# Patient Record
Sex: Female | Born: 1945
Health system: Southern US, Community
[De-identification: ages and names within clinical notes are randomized; demographics above are authoritative.]

## PROBLEM LIST (undated history)

## (undated) DIAGNOSIS — Z8719 Personal history of other diseases of the digestive system: Secondary | ICD-10-CM

## (undated) DIAGNOSIS — E785 Hyperlipidemia, unspecified: Secondary | ICD-10-CM

## (undated) DIAGNOSIS — D72819 Decreased white blood cell count, unspecified: Secondary | ICD-10-CM

## (undated) DIAGNOSIS — Z973 Presence of spectacles and contact lenses: Secondary | ICD-10-CM

## (undated) DIAGNOSIS — N2889 Other specified disorders of kidney and ureter: Secondary | ICD-10-CM

## (undated) DIAGNOSIS — K649 Unspecified hemorrhoids: Secondary | ICD-10-CM

## (undated) DIAGNOSIS — I10 Essential (primary) hypertension: Secondary | ICD-10-CM

## (undated) DIAGNOSIS — Z860100 Personal history of colon polyps, unspecified: Secondary | ICD-10-CM

## (undated) DIAGNOSIS — R319 Hematuria, unspecified: Secondary | ICD-10-CM

## (undated) HISTORY — DX: Essential (primary) hypertension: I10

## (undated) HISTORY — PX: BREAST BIOPSY: SHX20

---

## 1987-05-05 HISTORY — PX: APPENDECTOMY: SHX54

## 1998-02-22 ENCOUNTER — Ambulatory Visit (HOSPITAL_COMMUNITY): Admission: RE | Admit: 1998-02-22 | Discharge: 1998-02-22 | Payer: Self-pay | Admitting: *Deleted

## 1998-08-05 ENCOUNTER — Encounter: Admission: RE | Admit: 1998-08-05 | Discharge: 1998-08-27 | Payer: Self-pay | Admitting: Orthopedic Surgery

## 1998-08-14 ENCOUNTER — Ambulatory Visit (HOSPITAL_COMMUNITY): Admission: RE | Admit: 1998-08-14 | Discharge: 1998-08-14 | Payer: Self-pay | Admitting: Obstetrics and Gynecology

## 1998-10-17 ENCOUNTER — Encounter: Admission: RE | Admit: 1998-10-17 | Discharge: 1998-11-07 | Payer: Self-pay | Admitting: Orthopedic Surgery

## 1999-02-13 ENCOUNTER — Other Ambulatory Visit: Admission: RE | Admit: 1999-02-13 | Discharge: 1999-02-13 | Payer: Self-pay | Admitting: *Deleted

## 1999-05-05 HISTORY — PX: DILATION AND CURETTAGE OF UTERUS: SHX78

## 1999-07-10 ENCOUNTER — Encounter: Payer: Self-pay | Admitting: Obstetrics and Gynecology

## 1999-07-10 ENCOUNTER — Ambulatory Visit (HOSPITAL_COMMUNITY): Admission: RE | Admit: 1999-07-10 | Discharge: 1999-07-10 | Payer: Self-pay | Admitting: Obstetrics and Gynecology

## 1999-09-18 ENCOUNTER — Encounter: Payer: Self-pay | Admitting: *Deleted

## 1999-09-18 ENCOUNTER — Ambulatory Visit (HOSPITAL_COMMUNITY): Admission: RE | Admit: 1999-09-18 | Discharge: 1999-09-18 | Payer: Self-pay | Admitting: *Deleted

## 2000-02-19 ENCOUNTER — Other Ambulatory Visit: Admission: RE | Admit: 2000-02-19 | Discharge: 2000-02-19 | Payer: Self-pay | Admitting: *Deleted

## 2000-02-20 ENCOUNTER — Encounter: Payer: Self-pay | Admitting: *Deleted

## 2000-02-25 ENCOUNTER — Observation Stay (HOSPITAL_COMMUNITY): Admission: RE | Admit: 2000-02-25 | Discharge: 2000-02-26 | Payer: Self-pay | Admitting: *Deleted

## 2000-02-25 ENCOUNTER — Encounter (INDEPENDENT_AMBULATORY_CARE_PROVIDER_SITE_OTHER): Payer: Self-pay | Admitting: Specialist

## 2000-02-25 HISTORY — PX: SUPRACERVICAL ABDOMINAL HYSTERECTOMY: SHX5393

## 2000-07-22 ENCOUNTER — Encounter: Payer: Self-pay | Admitting: Obstetrics and Gynecology

## 2000-07-22 ENCOUNTER — Ambulatory Visit (HOSPITAL_COMMUNITY): Admission: RE | Admit: 2000-07-22 | Discharge: 2000-07-22 | Payer: Self-pay | Admitting: Obstetrics and Gynecology

## 2001-04-11 ENCOUNTER — Other Ambulatory Visit: Admission: RE | Admit: 2001-04-11 | Discharge: 2001-04-11 | Payer: Self-pay | Admitting: *Deleted

## 2001-07-26 ENCOUNTER — Ambulatory Visit (HOSPITAL_COMMUNITY): Admission: RE | Admit: 2001-07-26 | Discharge: 2001-07-26 | Payer: Self-pay | Admitting: Family Medicine

## 2001-07-26 ENCOUNTER — Encounter: Payer: Self-pay | Admitting: Family Medicine

## 2001-08-03 ENCOUNTER — Encounter: Admission: RE | Admit: 2001-08-03 | Discharge: 2001-08-03 | Payer: Self-pay | Admitting: Family Medicine

## 2001-08-03 ENCOUNTER — Encounter: Payer: Self-pay | Admitting: Family Medicine

## 2002-04-12 ENCOUNTER — Other Ambulatory Visit: Admission: RE | Admit: 2002-04-12 | Discharge: 2002-04-12 | Payer: Self-pay | Admitting: *Deleted

## 2002-05-31 ENCOUNTER — Other Ambulatory Visit: Admission: RE | Admit: 2002-05-31 | Discharge: 2002-05-31 | Payer: Self-pay | Admitting: *Deleted

## 2002-07-12 ENCOUNTER — Encounter: Payer: Self-pay | Admitting: Emergency Medicine

## 2002-07-12 ENCOUNTER — Emergency Department (HOSPITAL_COMMUNITY): Admission: EM | Admit: 2002-07-12 | Discharge: 2002-07-12 | Payer: Self-pay | Admitting: Emergency Medicine

## 2002-07-31 ENCOUNTER — Encounter: Admission: RE | Admit: 2002-07-31 | Discharge: 2002-07-31 | Payer: Self-pay | Admitting: Family Medicine

## 2002-07-31 ENCOUNTER — Encounter: Payer: Self-pay | Admitting: Family Medicine

## 2003-01-02 ENCOUNTER — Ambulatory Visit (HOSPITAL_COMMUNITY): Admission: RE | Admit: 2003-01-02 | Discharge: 2003-01-02 | Payer: Self-pay | Admitting: Gastroenterology

## 2003-03-14 ENCOUNTER — Encounter: Admission: RE | Admit: 2003-03-14 | Discharge: 2003-03-14 | Payer: Self-pay | Admitting: Family Medicine

## 2003-04-03 ENCOUNTER — Other Ambulatory Visit: Admission: RE | Admit: 2003-04-03 | Discharge: 2003-04-03 | Payer: Self-pay | Admitting: *Deleted

## 2003-08-01 ENCOUNTER — Encounter: Admission: RE | Admit: 2003-08-01 | Discharge: 2003-08-01 | Payer: Self-pay | Admitting: Family Medicine

## 2004-02-29 ENCOUNTER — Emergency Department (HOSPITAL_COMMUNITY): Admission: EM | Admit: 2004-02-29 | Discharge: 2004-02-29 | Payer: Self-pay | Admitting: Emergency Medicine

## 2004-04-08 ENCOUNTER — Other Ambulatory Visit: Admission: RE | Admit: 2004-04-08 | Discharge: 2004-04-08 | Payer: Self-pay | Admitting: *Deleted

## 2004-04-10 ENCOUNTER — Ambulatory Visit (HOSPITAL_COMMUNITY): Admission: RE | Admit: 2004-04-10 | Discharge: 2004-04-10 | Payer: Self-pay | Admitting: General Surgery

## 2004-08-13 ENCOUNTER — Encounter: Admission: RE | Admit: 2004-08-13 | Discharge: 2004-08-13 | Payer: Self-pay | Admitting: Family Medicine

## 2005-04-15 ENCOUNTER — Other Ambulatory Visit: Admission: RE | Admit: 2005-04-15 | Discharge: 2005-04-15 | Payer: Self-pay | Admitting: *Deleted

## 2005-06-09 ENCOUNTER — Encounter: Admission: RE | Admit: 2005-06-09 | Discharge: 2005-06-09 | Payer: Self-pay | Admitting: Occupational Medicine

## 2005-06-11 ENCOUNTER — Encounter: Admission: RE | Admit: 2005-06-11 | Discharge: 2005-06-11 | Payer: Self-pay | Admitting: *Deleted

## 2005-08-27 ENCOUNTER — Encounter: Admission: RE | Admit: 2005-08-27 | Discharge: 2005-08-27 | Payer: Self-pay | Admitting: Family Medicine

## 2005-08-28 ENCOUNTER — Inpatient Hospital Stay (HOSPITAL_COMMUNITY): Admission: EM | Admit: 2005-08-28 | Discharge: 2005-08-31 | Payer: Self-pay | Admitting: Emergency Medicine

## 2005-08-28 ENCOUNTER — Ambulatory Visit: Payer: Self-pay | Admitting: Cardiology

## 2005-08-31 ENCOUNTER — Encounter: Payer: Self-pay | Admitting: Cardiology

## 2005-09-11 ENCOUNTER — Ambulatory Visit (HOSPITAL_COMMUNITY): Admission: RE | Admit: 2005-09-11 | Discharge: 2005-09-11 | Payer: Self-pay | Admitting: Family Medicine

## 2005-11-05 ENCOUNTER — Ambulatory Visit (HOSPITAL_COMMUNITY): Admission: RE | Admit: 2005-11-05 | Discharge: 2005-11-05 | Payer: Self-pay | Admitting: Gastroenterology

## 2005-11-09 ENCOUNTER — Ambulatory Visit (HOSPITAL_COMMUNITY): Admission: RE | Admit: 2005-11-09 | Discharge: 2005-11-09 | Payer: Self-pay | Admitting: Gastroenterology

## 2006-01-21 ENCOUNTER — Ambulatory Visit (HOSPITAL_COMMUNITY): Admission: RE | Admit: 2006-01-21 | Discharge: 2006-01-21 | Payer: Self-pay | Admitting: Orthopedic Surgery

## 2006-04-28 ENCOUNTER — Other Ambulatory Visit: Admission: RE | Admit: 2006-04-28 | Discharge: 2006-04-28 | Payer: Self-pay | Admitting: *Deleted

## 2006-09-01 ENCOUNTER — Encounter: Admission: RE | Admit: 2006-09-01 | Discharge: 2006-09-01 | Payer: Self-pay | Admitting: Family Medicine

## 2007-05-09 ENCOUNTER — Other Ambulatory Visit: Admission: RE | Admit: 2007-05-09 | Discharge: 2007-05-09 | Payer: Self-pay | Admitting: *Deleted

## 2007-07-12 ENCOUNTER — Encounter (INDEPENDENT_AMBULATORY_CARE_PROVIDER_SITE_OTHER): Payer: Self-pay | Admitting: Gastroenterology

## 2007-07-12 ENCOUNTER — Ambulatory Visit (HOSPITAL_COMMUNITY): Admission: RE | Admit: 2007-07-12 | Discharge: 2007-07-12 | Payer: Self-pay | Admitting: Gastroenterology

## 2007-09-02 ENCOUNTER — Encounter: Admission: RE | Admit: 2007-09-02 | Discharge: 2007-09-02 | Payer: Self-pay | Admitting: Family Medicine

## 2007-10-22 ENCOUNTER — Emergency Department (HOSPITAL_COMMUNITY): Admission: EM | Admit: 2007-10-22 | Discharge: 2007-10-22 | Payer: Self-pay | Admitting: Emergency Medicine

## 2008-06-18 ENCOUNTER — Ambulatory Visit (HOSPITAL_BASED_OUTPATIENT_CLINIC_OR_DEPARTMENT_OTHER): Admission: RE | Admit: 2008-06-18 | Discharge: 2008-06-18 | Payer: Self-pay | Admitting: Urology

## 2008-06-18 HISTORY — PX: OTHER SURGICAL HISTORY: SHX169

## 2008-07-23 ENCOUNTER — Encounter (INDEPENDENT_AMBULATORY_CARE_PROVIDER_SITE_OTHER): Payer: Self-pay | Admitting: Gastroenterology

## 2008-07-23 ENCOUNTER — Ambulatory Visit (HOSPITAL_COMMUNITY): Admission: RE | Admit: 2008-07-23 | Discharge: 2008-07-23 | Payer: Self-pay | Admitting: Gastroenterology

## 2008-09-03 ENCOUNTER — Encounter: Admission: RE | Admit: 2008-09-03 | Discharge: 2008-09-03 | Payer: Self-pay | Admitting: Family Medicine

## 2009-08-19 ENCOUNTER — Ambulatory Visit (HOSPITAL_COMMUNITY): Admission: RE | Admit: 2009-08-19 | Discharge: 2009-08-19 | Payer: Self-pay | Admitting: Gastroenterology

## 2009-09-05 ENCOUNTER — Encounter: Admission: RE | Admit: 2009-09-05 | Discharge: 2009-09-05 | Payer: Self-pay | Admitting: Family Medicine

## 2010-04-03 ENCOUNTER — Ambulatory Visit (HOSPITAL_COMMUNITY)
Admission: RE | Admit: 2010-04-03 | Discharge: 2010-04-03 | Payer: Self-pay | Source: Home / Self Care | Admitting: Gastroenterology

## 2010-05-25 ENCOUNTER — Encounter: Payer: Self-pay | Admitting: Gastroenterology

## 2010-07-28 ENCOUNTER — Encounter: Payer: Self-pay | Admitting: Obstetrics and Gynecology

## 2010-08-14 ENCOUNTER — Ambulatory Visit (INDEPENDENT_AMBULATORY_CARE_PROVIDER_SITE_OTHER): Payer: Commercial Managed Care - PPO | Admitting: Cardiovascular Disease

## 2010-08-14 ENCOUNTER — Encounter: Payer: Self-pay | Admitting: Cardiovascular Disease

## 2010-08-14 VITALS — BP 138/84 | HR 60 | Ht 64.0 in | Wt 155.0 lb

## 2010-08-14 DIAGNOSIS — I10 Essential (primary) hypertension: Secondary | ICD-10-CM | POA: Insufficient documentation

## 2010-08-14 MED ORDER — LOSARTAN POTASSIUM 50 MG PO TABS: 50.0000 mg | ORAL_TABLET | Freq: Every day | ORAL | Status: DC

## 2010-08-14 NOTE — Progress Notes (Signed)
Caitlin Walker Date of Birth  October 28, 1945 [facility] / [facility] [address]     [address] [place], [place]  [address] [phone]  Fax  [phone]   History of Present Illness: Caitlin Walker is a middle-age female with a history of hypertension. We are asked to see her today for further management of her high blood pressure.  Her blood pressure has been gradually increasing since December. She is very careful about eating any extra salt. She exercises 3 times a week. She recently was tried on Bystolic but this caused her to have some atypical episodes of chest pain. These episodes of chest pain occurred in the morning and never occurred with exertion. They resolved after she discontinued the Bystolic. She denies any PND or orthopnea. She denies any syncope or presyncope.  The patient denies any heat or cold intolerance.  No weight gain or weight loss.  The patient denies headaches or blurry vision.  There is no cough or sputum production.  The patient denies dizziness.  There is no hematuria or hematochezia.  The patient denies any muscle aches or arthritis.  The patient denies any rash.  The patient denies frequent falling or instability.  There is no history of depression or anxiety.  All other systems were reviewed and are negative.  Current Outpatient Prescriptions  Medication Sig Dispense Refill  . amLODipine (NORVASC) 5 MG tablet Take 5 mg by mouth daily.        . calcium citrate-vitamin D 200-200 MG-UNIT TABS Take 1 tablet by mouth daily.        . hydrochlorothiazide 25 MG tablet Take 25 mg by mouth daily.        Marland Kitchen zolpidem (AMBIEN CR) 12.5 MG CR tablet Take 12.5 mg by mouth at bedtime as needed.           Allergies  Allergen Reactions  . Bactrim Rash  . Flomax (Tamsulosin Hcl) Rash    Past Medical History  Diagnosis Date  . Hypertension   . Personal history of kidney stones 2010    cystoscopy done right ureter unable to pass scope, passed  stone on own  . Chest pain at rest     Past Surgical History  Procedure Date  . Appendectomy     History  Smoking status  . Never Smoker   Smokeless tobacco  . Not on file    History  Alcohol Use No    Family History  Problem Relation Age of Onset  . Heart disease Father   . Hypertension Father   . Hypertension Sister   . Hyperlipidemia Sister   . Hypertension Brother   . Hypertension Sister   . Colon cancer Sister   . Hypertension Sister   . Hypertension Sister   . Hypertension Sister   . Hyperlipidemia Sister   . Hypertension Brother   . Hypertension Brother   . Hyperlipidemia Brother     Reviw of Systems:  Reviewed in the history of present illness. All other systems are negative. Physical Exam: BP 138/84  Pulse 60  Ht 5\' 4"  (1.626 m)  Wt 155 lb (70.308 kg)  BMI 26.61 kg/m2 The patient is alert and oriented x 3.  The mood and affect are normal.  The skin is warm and dry.  Color is normal.  The HEENT exam reveals that the sclera are nonicteric.  The mucous membranes are moist.  The carotids are 2+ without bruits.  There is no thyromegaly.  There  is no JVD.  The lungs are clear.  The chest wall is non tender.  The heart exam reveals a regular rate with a normal S1 and S2.  There are no murmurs, gallops, or rubs.  The PMI is not displaced.   Abdominal exam reveals good bowel sounds.  There is no guarding or rebound.  There is no hepatosplenomegaly or tenderness.  There are no masses.  Exam of the legs reveal no clubbing, cyanosis, or edema.  The legs are without rashes.  The distal pulses are intact.  Cranial nerves II - XII are intact.  Motor and sensory functions are intact.  The gait is normal.  ECG: From Dr. Tenny Craw office reveals sinus bradycardia. She has no ST or T wave changes. Assessment / Plan:

## 2010-08-14 NOTE — Assessment & Plan Note (Signed)
Caitlin Walker is doing quite a bit better from a cardiac standpoint. Her blood pressure is only mildly elevated today. I like to add losartan 50 mg a day to her current medical regimen. He should help bring down her diastolic blood pressure. I would rather do this than to increase her Norvasc to 10 mg a day since I think that would be associated with worsening side effects.  I've asked her to exercise on a regular basis. I'll see her back in 3 months for office visit and basic metabolic profile.

## 2010-08-18 ENCOUNTER — Telehealth: Payer: Self-pay | Admitting: Cardiovascular Disease

## 2010-08-18 ENCOUNTER — Telehealth: Payer: Self-pay | Admitting: *Deleted

## 2010-08-18 NOTE — Telephone Encounter (Signed)
Pt was seen last week. Losartan was started to lower diastoloc pressure, pt has continued with hctz and norvasc. Pt noted increased  Bilateral leg edema on 08/16/10 and elevated legs for two days with good results. Pt has not taken Norvasc today, she feels this med is causing swelling. Are you ok with DC ing Norvasc? Please advise. Alfonso Ramus RN

## 2010-08-18 NOTE — Telephone Encounter (Signed)
CALL PATIENT REGARDING SOME SWELLING IN LEGS, THINKS ITS ONE OF HER MEDS.

## 2010-08-18 NOTE — Telephone Encounter (Signed)
msg left to dc norvasc and to follow up for bp check app.Alfonso Ramus RN

## 2010-08-18 NOTE — Telephone Encounter (Signed)
OK to stop Norvasc  

## 2010-08-19 LAB — POCT I-STAT 4, (NA,K, GLUC, HGB,HCT)
Glucose, Bld: 88 mg/dL (ref 70–99)
HCT: 39 % (ref 36.0–46.0)
Hemoglobin: 13.3 g/dL (ref 12.0–15.0)
Potassium: 4.3 meq/L (ref 3.5–5.1)
Sodium: 140 meq/L (ref 135–145)

## 2010-09-02 ENCOUNTER — Other Ambulatory Visit: Payer: Self-pay | Admitting: Family Medicine

## 2010-09-02 DIAGNOSIS — Z780 Asymptomatic menopausal state: Secondary | ICD-10-CM

## 2010-09-02 DIAGNOSIS — Z1231 Encounter for screening mammogram for malignant neoplasm of breast: Secondary | ICD-10-CM

## 2010-09-16 NOTE — Op Note (Signed)
NAME:  MAHI, ZABRISKIE NO.:  [id number]   MEDICAL RECORD NO.:  [id number]          PATIENT TYPE:  AMB   LOCATION:  ENDO                         FACILITY:  [facility]   PHYSICIAN:  Petra Kuba, M.D.    DATE OF BIRTH:  [birth year]   DATE OF PROCEDURE:  07/12/2007  DATE OF DISCHARGE:                               OPERATIVE REPORT   PROCEDURE:  Colonoscopy.   INDICATIONS:  Patient with a family history of colon cancer, family  history of colon polyps, due for colonic screening.  Consent was signed  after risks, benefits, methods, options thoroughly discussed multiple  times in the past.   MEDICINES USED:  Fentanyl 75 mcg, Versed 7 mg.   PROCEDURE:  Rectal inspection is pertinent for external hemorrhoids,  small.  Digital exam was negative.  Video pediatric colonoscope was  inserted with abdominal pressure fairly easily able to be advanced  around the colon to the cecum.  No abnormalities were seen on insertion.  The cecum was identified by the appendiceal orifice and the ileocecal  valve.  In fact, the scope was inserted short way into the terminal  ileum, which was normal.  Photo documentation was obtained and the scope  was slowly withdrawn.  Prep was adequate.  There was some liquid stool  that required washing and suctioning.  On slow withdrawal through the  colon there was a semisessile polyp, small, right on the ileocecal  valve.  It was snared x1.  A piece was suctioned through the scope and  collected in the trap.  We did try to re-snare some possible polypoid  residual tissue but were unsuccessful, so we went ahead and took two hot  biopsies of the area.  The scope was then slowly withdrawn.  Two other  additional tiny polyps were seen, one in the mid ascending, one in the  proximal sigmoid.  Both were hot biopsied, put in separate containers.  No other abnormalities were seen as we slowly withdrew back to the  rectum.  Once back in the rectum anorectal  pull-through and retroflexion  confirmed some small hemorrhoids.  Scope was straightened and readvanced  short way up the left side of the colon.  Air was suctioned, scope  removed.  The patient tolerated the procedure well.  There was no  obvious immediate complication.   ENDOSCOPIC DIAGNOSES:  1. Internal-external small hemorrhoids.  2. Ileocecal valve small polyp, status post snare and hot biopsy x2.  3. Two tiny ascending and sigmoid polyps, hot biopsied.  4. Otherwise within normal limits to the terminal ileum.   PLAN:  Await pathology to determine future colonic screening.  If this  is an adenomatous polyp in the ileocecal valve, will need to be screened  sooner than usual to make sure all is completely removed.  Hold aspirin  and nonsteroidals for 10 days.  Happy to see back p.r.n.           ______________________________  Petra Kuba, M.D.     MEM/MEDQ  D:  07/12/2007  T:  07/13/2007  Job:  [id number]   cc:   C.  Duane Lope, M.D.

## 2010-09-16 NOTE — Op Note (Signed)
NAME:  Caitlin Walker, Caitlin Walker NO.:  [id number]   MEDICAL RECORD NO.:  [id number]          PATIENT TYPE:  AMB   LOCATION:  ENDO                         FACILITY:  [facility]   PHYSICIAN:  Petra Kuba, M.D.    DATE OF BIRTH:  02/04/1946   DATE OF PROCEDURE:  07/23/2008  DATE OF DISCHARGE:                               OPERATIVE REPORT   PROCEDURE:  Colonoscopy.   INDICATIONS:  Patient with difficult to remove IC valve polyp, family  history of colon cancer and colon polyps, want to repeat colonoscopy.  Consent was signed after risks, benefits, methods, options thoroughly  discussed multiple times in the past.   MEDICINES USED:  Fentanyl 125 mcg, Versed 12 mg, Benadryl 25 mg.   PROCEDURE:  Rectal inspection is pertinent for external hemorrhoids  small.  Digital exam was negative.  The video pediatric colonoscope was  inserted and with abdominal pressure, fairly easily able to be advanced  around the colon to the cecum.  Other than the ileocecal valve recurrent  polyp versus residual polyp, no other abnormalities were seen.  The  cecum was identified by the appendiceal orifice and ileocecal valve.  In  fact, the scope was inserted a short ways in the terminal ileum.  The  polyp did not seem to enter the terminal ileum.  We went ahead and  snared the polyp three different times, even though it was small to  medium but it was difficult position, electrocautery was applied and all  pieces were snared and collected in the trap.  We then hot biopsied the  edge of the polyp and just to make sure there was no residual tissue  left, went ahead and ERBE'd with the argon tissue plasma coagulator more  less the entire area.  There was a nice white coagulum throughout  without obvious residual polyp.  We elected to slowly withdraw.  On slow  withdrawal through the colon, no other polypoid lesions diverticula or  other abnormalities were seen as we slowly withdrew back to the rectum.  Once back in the rectum, anorectal pull-through and retroflexion  confirmed some small hemorrhoids.  Scope was straightened and readvanced  a short ways up the left side of the colon.  Air was suctioned, scope  removed.  The patient tolerated the procedure well.  There was no  obvious immediate complication.   ENDOSCOPIC DIAGNOSES:  1. Internal-external hemorrhoids.  2. Residual versus recurrent ileocecal valve polyp status post snared,      hot biopsied and ERBE argon tissue plasma coagulator.   PLAN:  Await pathology to determine future screening.  Relook at the  polyp anywhere from 1-3 years depending on pathology.  Happy to see back  p.r.n.  Return care to Dr. Tenny Craw for the customary healthcare screening  and maintenance.           ______________________________  Petra Kuba, M.D.     MEM/MEDQ  D:  07/23/2008  T:  07/23/2008  Job:  [id number]   cc:   Miguel Aschoff, M.D.  Fax: [phone]

## 2010-09-16 NOTE — Op Note (Signed)
NAME:  Caitlin Walker, Caitlin Walker NO.:  [id number]   MEDICAL RECORD NO.:  [id number]          PATIENT TYPE:  AMB   LOCATION:  [facility]                         FACILITY:  [facility]   PHYSICIAN:  Bertram Millard. Dahlstedt, M.D.DATE OF BIRTH:  [birth year]   DATE OF PROCEDURE:  06/18/2008  DATE OF DISCHARGE:                               OPERATIVE REPORT   PREOPERATIVE DIAGNOSIS:  Right pelvic pain/bladder pain.   POSTOPERATIVE DIAGNOSIS:  Possible mild interstitial cystitis.   SURGICAL PROCEDURES:  Cystoscopy, right retrograde ureteral pyelogram,  hydrodistention of bladder, right ureteroscopy, urethral dilatation,  placement of Pyridium and Marcaine.   SURGEON:  Bertram Millard. Dahlstedt, M.D.   ANESTHESIA:  General with LMA.   COMPLICATIONS:  None.   BRIEF HISTORY:  Caitlin Walker is a 65 year old female with persistent right  pelvic and lower abdominal pain.  This occurs mainly with a full  bladder.  She had been evaluated for microscopic hematuria.  For a  while, in 2009, it was felt that she may have a right ureteral stone.  Further evaluation revealed that she probably did not have a stone.  However, she has had persistent right pelvic pain and difficulty  emptying her bladder.  Recent urodynamics showed poor contractility of  the bladder.  Due to her persistent symptoms, and the possibility that  she may have an odd type of interstitial cystitis, she is brought to the  operating room now for definitive HOD and retrograde pyelograms as well  as urethral dilatation.  Risks and complications have been discussed  with the patient.  She understands these and desires to proceed.   DESCRIPTION OF PROCEDURE:  The patient was identified in the holding  area.  She received preoperative IV antibiotics.  She was taken to the  operating room where general anesthetic was administered using LMA.  She  was placed in the dorsal lithotomy position.  Genitalia and perineum  were prepped and draped after a B  and O suppository was placed.  The  bladder was inspected circumferentially with the 12 degrees lens.  There  were very mild trabeculations.  There were some mildly prominent vessels  throughout.  No bladder lesions were seen, however.  A retrograde  pyelogram was performed on the right.  Catheterization of the ureteral  orifice was quite difficult as the ureteral orifice on this side was  quite tiny.  Eventually, a guidewire was advanced through the orifice  and a 6-French open-ended catheter advanced over top of that.  A  retrograde was performed.  There are no filling defects along the course  of the right ureter.  Following adequate visualization of the right-  sided system, the open-end catheter was removed.  At this point, using  fluoroscopy, there seemed to be a filling defect distally in the ureter.  I then dilated the distal ureter over a guidewire with the inner sheath  of the ureteral access catheter.  Ureteroscopy was performed all the way  up to the pelvis.  I did not see any sign of the stone.  There was some  mild narrowing of the right distal ureter, right at the  UVJ, however.  I  attempted to perform a left retrograde.  The ureteral orifice was nipple  shaped, and I could not navigate a guidewire through this area.  As the  patient had his right sided symptoms, I was not aggressive in performing  a left-sided retrograde.  The bladder was then filled to capacity with  water at a height of 700 mm.  The water was left indwelling for 10  minutes.  The bladder was evacuated.  Very, very mild glomerulations  were seen.  Actually, these were not that much different from before the  hydrodistention.  At this  point the urethra was dilated to 30-French with female sounds and a  Pyridium Marcaine mixture placed.  The procedure was then terminated.  She was awakened and taken to PACU in stable condition.  She was sent  home with 3 days of cephalexin 500 mg b.i.d. as well as  hydrocodone.  She will follow-up in approximately 2 weeks.      Bertram Millard. Dahlstedt, M.D.  Electronically Signed     SMD/MEDQ  D:  06/18/2008  T:  06/18/2008  Job:  [id number]   cc:   C. Duane Lope, M.D.  Fax: [phone]

## 2010-09-18 ENCOUNTER — Ambulatory Visit
Admission: RE | Admit: 2010-09-18 | Discharge: 2010-09-18 | Disposition: A | Discharge status: Home / Self Care | Payer: Commercial Managed Care - PPO | Source: Ambulatory Visit | Attending: Family Medicine | Admitting: Family Medicine

## 2010-09-18 DIAGNOSIS — Z1231 Encounter for screening mammogram for malignant neoplasm of breast: Secondary | ICD-10-CM

## 2010-09-18 DIAGNOSIS — Z780 Asymptomatic menopausal state: Secondary | ICD-10-CM

## 2010-09-19 NOTE — Discharge Summary (Signed)
[facility]  Patient:    Caitlin Walker, Caitlin Walker                     MRN: [id number] Adm. Date:  02/25/00 Disc. Date: 02/26/00 Attending:  Almedia Balls. Randell Patient, M.D.                           Discharge Summary  ADMISSION HISTORY:  The patient is a 65 year old with enlarging leiomyomata uteri, pelvic pain, bladder symptoms secondary to impingement by the fibroids on her bladder.  She was admitted on October 24 for abdominal hysterectomy and bilateral salpingo-oophorectomy and possible anterior and posterior colporrhaphy because of cystocele and rectocele.  Remainder of her History and Physical are as previously dictated.  LABORATORY AND X-RAY DATA:  Laboratory data includes preoperative hemoglobin 12.1, immediate postoperative hemoglobin 7.9, hemoglobin on February 26, 2000, 7.7.  Basic metabolic panel was within normal limits.  Chest x-ray and electrocardiogram were normal.  HOSPITAL COURSE:  The patient was taken to the operating room on October 24 at which time abdominal supracervical hysterectomy, bilateral salpingo-oophorectomy, and posterior colporrhaphy were performed.  The patient did well postoperatively except for some pain and nausea thought to be secondary to morphine PCA.  After stopping the morphine, the nausea resolved. Diet and ambulation were progressed over the evening of October 24 and early morning of October 25.  On the morning of October 25, she was afebrile and experiencing no problems except for the nausea which did improve, and it was felt that she could be discharged at this time.  FINAL DIAGNOSES: 1. Leiomyomata uteri. 2. Pelvic pain. 3. Rectocele.  OPERATIONS/PROCEDURES PERFORMED: 1. Abdominal supracervical hysterectomy. 2. Bilateral salpingo-oophorectomy. 3. Posterior colporrhaphy.  PATHOLOGY:  Pathology report unavailable at the time of dictation.  DISPOSITION/FOLLOWUP:  Discharged home to return to the office  in approximately two weeks for follow-up.  She is to call for any problems.  DISCHARGE INSTRUCTIONS:  She was instructed to gradually progress her activities over several weeks at home and to limit lifting and driving for two weeks.  CONDITION ON DISCHARGE:  She is fully ambulatory, on a regular diet, and in good condition at the time of discharge.  DISCHARGE MEDICATIONS:  She is given a prescription for Tylox or generic #30 to be taken one or two q.4h. p.r.n. pain.  DD:  02/26/00 TD:  02/27/00 Job: [id number]

## 2010-09-19 NOTE — Op Note (Signed)
   NAME:  Caitlin Walker, Caitlin Walker                      ACCOUNT NO.:  [id number]   MEDICAL RECORD NO.:  [id number]                   PATIENT TYPE:  AMB   LOCATION:  ENDO                                 [facility]AC[facility]   PHYSICIAN:  Petra Kuba, M.D.                 DATE OF BIRTH:  [birth year]   DATE OF PROCEDURE:  01/02/2003  DATE OF DISCHARGE:                                 OPERATIVE REPORT   PROCEDURE:  Colonoscopy.   INDICATIONS FOR PROCEDURE:  Some mild change in bowel habits, due for  colonic screening.   Consent was signed after risks, benefits, methods, and options were  thoroughly discussed in the past.   MEDICINES USED:  Demerol 50, Versed 5.   DESCRIPTION OF PROCEDURE:  Rectal inspection was pertinent for external  hemorrhoids. Digital exam was negative. The pediatric video adjustable  colonoscope was inserted, fairly easily despite a tortuous sigmoid advanced  to the cecum. This did require some abdominal pressure but no position  changes. No obvious abnormality was seen on insertion. The cecum was  identified by the appendiceal orifice and the ileocecal valve. In fact, the  scope was inserted a short ways into the terminal ileum which was normal.  Photo documentation was obtained. The scope was slowly withdrawn. The prep  was adequate. There was minimal liquid stool that required washing and  suctioning. On slow withdrawal through the colon, other than the tortuous  sigmoid no abnormalities were seen. Anal rectal pull through and  retroflexion in the rectum pertinent for some small hemorrhoids. The scope  was reinserted a short ways up the left side of the colon, air was  suctioned, scope removed. The patient tolerated the procedure well. There  was no obvious or immediate complications.   ENDOSCOPIC DIAGNOSIS:  1. Internal and external small hemorrhoids.  2. Tortuous sigmoid.  3. Otherwise within normal limits to the terminal ileum.   PLAN:  Happy to see back p.r.n.   Repeat screening in 5-10 years, otherwise,  return care to Dr. Tenny Craw for the customary health care maintenance to include  yearly rectals and guaiacs.                                               Petra Kuba, M.D.    MEM/MEDQ  D:  01/02/2003  T:  01/02/2003  Job:  [id number]   cc:   C. Duane Lope, M.D.  [address]  [place]  [place] [address]  Fax: [phone]

## 2010-09-19 NOTE — Op Note (Signed)
[facility]  Patient:    Caitlin Walker, Caitlin Walker                MRN: [id number] Proc. Date: 02/25/00 Adm. Date:  [id number] Attending:  Collene Schlichter CC:         Harl Bowie, M.D.   Operative Report  PREOPERATIVE DIAGNOSIS:  Enlarging leiomyomata, pelvic pain, and moderate to severe rectocele.  POSTOPERATIVE DIAGNOSIS:  Enlarging leiomyomata, pelvic pain, and moderate to severe rectocele, pending pathology.  OPERATION:  Abdominal supracervical hysterectomy, bilateral salpingo-oophorectomy, and posterior colporrhaphy.  ANESTHESIA:  General orotracheal.  SURGEON:  Almedia Balls. Randell Patient, M.D.  FIRST ASSISTANT:  Harl Bowie, M.D.  INDICATIONS:  The patient is a 65 year old with the above noted problems who was counseled as to the need for surgery to treat these problems.  She was fully counseled as to the nature of the procedure and the risks involved including the risks of anesthesia, injury to bowel or bladder, blood vessel, ureters, postoperative hemorrhage, infection, recuperation, and the possible use of hormone replacement should her ovaries be removed.  She fully understand all these considerations and wishes to proceed on February 25, 2000, and definitely does want her ovaries removed.  OPERATIVE FINDINGS:  On entry into the uterus, the uterus was approximately [redacted] weeks gestation in size, quite irregular with multiple subserosal myomata. Palpation of the upper abdominal viscera revealed the lower liver edge to be smooth.  The gallbladder felt normal as did the spleen, kidneys, and periaortic areas.  The patient is status post appendectomy.  Left ovary appeared to be inactive.  There was also a moderate to severe rectocele present.  DESCRIPTION OF PROCEDURE:  With the patient under general anesthesia, prepared and draped in the usual sterile fashion, and with a Foley catheter in the bladder, a lower abdominal transverse incision was  made and carried into the peritoneal cavity without difficulty.  Self-retaining retractor was placed, and the bowel was packed off.  Kelly clamps were used to clamp the utero-ovarian anastomoses, tubes, and round ligament bilaterally for traction and hemostasis.  Suture ligatures were placed on the round ligaments bilaterally which were then cut medial to the sutures with opening of the retroperitoneal space and development of a bladder flap on the anterior surface of the uterus which was made more difficult because of a large lower uterine segment anterior myoma.  The infundibulopelvic ligament bilaterally were identified, well above the course of the ureters, clamped, cut, and doubly ligated with 1 chromic catgut.  Uterine vessels were then skeletonized. It is noted that a large plexus of vessels in the left uterine vessel area was bleeding distally.  The vessels were clamped, cut, and suture ligated with 1 chromic catgut.  The patient experienced quite a bit of blood loss at this point however.  At this point, the right uterine vessels being clamped, cut, and suture ligated with 1 chromic catgut, it was possible to excise the uterine fundus which allowed for better visualization and technical ease.  The remaining portion of the lower uterine segment and endocervix were then removed using Bovie electrocoagulation.  Figure-of-eight sutures of #1 chromic catgut was placed at the cervical angles bilaterally for hemostasis.  The midportion of the cervix was reapproximated and rendered hemostatic with an interrupted mattress suture of 1 chromic catgut.  The area was then lavaged with copious amounts of lactated Ringers solution, and after noting that hemostasis was maintained, and that sponge and instrument counts were correct, the peritoneum was  closed with a continuous suture of 0 Vicryl.  The fascia was closed with continuous two sutures of 0 Vicryl which were brought in the lateral aspect  of the fascia and tied separately in the midline.  Each layer was lavaged with copious amounts of lactated Ringers prior to full closure.  The subcutaneous fat was closed with interrupted sutures of #1 chromic catgut, and the skin was closed with a subcuticular suture of 4-0 plain catgut.  The patient was then repositioned and draped for posterior colporrhaphy procedure.  Allis clamps were placed at the mucocutaneous junctions bilaterally with the intervening tissue being excised.  A solution of 1% lidocaine with 1:200,000 epinephrine was injected in the midline underneath the vaginal mucosa for hemostasis and delineation of fascial planes.  An incision was made in this area as well with gradual dissection of the _____ underlying fascia and underlying tissue also of the vaginal mucosa. Interrupted sutures of 0 Vicryl were placed in an imbricating fascia to reduce the rectocele.  After this was accomplished, the redundant vaginal mucosa was incised.  The vaginal mucosa was then reapproximated and rendered hemostatic with a continuous interlocking suture of 2-0 chromic catgut.  The skin incision had been injected with 10 ml of 0.5% Marcaine with 1:200,000 epinephrine for postoperative analgesia as well.  Estimated blood loss was 900 ml.  The patient was taken to the recovery room in good condition with clear urine in a Foley catheter tubing.  She will be placed on 23-hour observation following surgery. DD:  02/25/00 TD:  02/25/00 Job: [id number]

## 2010-09-19 NOTE — H&P (Signed)
NAME:  Caitlin Walker, THOBE NO.:  [id number]   MEDICAL RECORD NO.:  [id number]          PATIENT TYPE:  INP   LOCATION:  1404                         FACILITY:  [facility]   PHYSICIAN:  Jackie Plum, M.D.DATE OF BIRTH:  10-24-1945   DATE OF ADMISSION:  08/28/2005  DATE OF DISCHARGE:                                HISTORY & PHYSICAL   CHIEF COMPLAINT:  Chest pain.   HISTORY OF PRESENT ILLNESS:  The patient is 65 year old African American  lady with previous medical history for hypertension, GERD, and history of  uterine fibromyomata, status post hysterectomy with bilateral salpingo-  oophorectomy and posterior colporrhaphy. This was done in 2001 by Dr. Dorena Bodo  of gynecology.  According to the patient, she had had some problems with  dizziness and had seen her primary care physician and according to the  patient, she has had followups of blood pressure and her primary care  physician had increased dose of her hydrochlorothiazide.   DICTATION ENDED AT THIS POINT.      Jackie Plum, M.D.  Electronically Signed     GO/MEDQ  D:  08/28/2005  T:  08/28/2005  Job:  [id number]   cc:   Dorma Russell  He already retired in 2004

## 2010-09-19 NOTE — Op Note (Signed)
NAME:  Caitlin Walker, Caitlin Walker NO.:  [id number]   MEDICAL RECORD NO.:  [id number]          PATIENT TYPE:  AMB   LOCATION:  ENDO                         FACILITY:  [facility]   PHYSICIAN:  Petra Kuba, M.D.    DATE OF BIRTH:  [birth year]   DATE OF PROCEDURE:  11/05/2005  DATE OF DISCHARGE:                                 OPERATIVE REPORT   PROCEDURE:  Esophagogastroduodenoscopy.   INDICATIONS:  Atypical chest pain.   Consent was signed after risks, benefits, methods and options were  thoroughly discussed multiple times in the past.   MEDICATIONS:  Fentanyl 40 mcg, Versed 6 mg.   DESCRIPTION OF PROCEDURE:  The video endoscope was inserted by direct  vision.  The esophagus was normal and in the distal esophagus was a tiny  hiatal hernia.  Scope passed into the stomach, advanced to the antrum where  a very minimal amount of antritis was seen, then through the normal bulb  into the C loop and a normal second portion of the duodenum.  The scope was  slowly withdrawn back to the bulb and a good look there ruled out  abnormalities in all locations. The scope was withdrawn back to the stomach  and retroflexed. Cartia, fundus, angularis, lesser and greater curve were  evaluated on retroflexed visualization.  No abnormalities were seen.  Straight visualization of the stomach did not reveal any additional  findings.  Air was suctioned, scope was slowly withdrawn again and a good  look at the esophagus was normal as mentioned above except for the tiny  hiatal hernia.  The scope was removed.  The patient tolerated the procedure  well. There were no obvious immediate complications.   ENDOSCOPIC DIAGNOSES:  1.  Tiny hiatal hernia.  2.  Minimal antritis.  3.  Otherwise normal esophagogastroduodenoscopy.   PLAN:  Continue Prilosec.  Happy to see back p.r.n.  Return care to Dr. Tenny Craw  and Dr. Talmage Nap for discussion of whether she needs her gallbladder out of  not. She does believe this  caused her pain and wants it out but we will  leave this decision up to Dr. Talmage Nap.           ______________________________  Petra Kuba, M.D.     MEM/MEDQ  D:  11/05/2005  T:  11/05/2005  Job:  [id number]   cc:   C. Duane Lope, M.D.  Fax: [phone]   Dorisann Frames, M.D.  Fax: [phone]

## 2010-09-19 NOTE — H&P (Signed)
NAME:  Caitlin Walker, Caitlin Walker NO.:  [id number]   MEDICAL RECORD NO.:  [id number]          PATIENT TYPE:  INP   LOCATION:  1404                         FACILITY:  [facility]   PHYSICIAN:  Jackie Plum, M.D.DATE OF BIRTH:  12/20/1945   DATE OF ADMISSION:  08/28/2005  DATE OF DISCHARGE:                                HISTORY & PHYSICAL   CHIEF COMPLAINT:  Chest pain.   HISTORY OF PRESENT ILLNESS:  The patient is a 65 year old African American  lady with history of hypertension, GERD and uterine fibromyomata, status  post hysterectomy with bilateral salpingo-oophorectomy by Dr. Randell Patient in 2001  who presented with one-day history of above complaint.  According to the  patient, she has been well until a couple of weeks ago when she had some  problems with her blood pressure being elevated.  Her primary care physician  increased the dose of her hydrochlorothiazide to 25 mg.  Thereafter, the  patient started having some problems with dizziness, went to see her primary  care physician, Dr. Tenny Craw, whereupon she was found to be relatively  hypotensive and, therefore, the dose of her hydrochlorothiazide was adjusted  to a lower dose regimen.  She was given some two days of bed rest after  which she improved and had been going to work since the last two days or so.  Today, while she was at work, she started feeling a sensation of sharp,  epigastric pain, which radiated to her lower sternal area as if someone was  sitting on her chest with a pressure-like component.  She felt short of  breath and nauseated without vomiting.  She also realized that her pain was  worsened some by deep breathing.  On account of this, she came to the ED  whereupon she was evaluated further with blood work including x-ray of the  chest which did not show any acute infiltrate.  She also had point-of-care  cardiac markers x1 done which was within normal limits.  She had a D-dimer  drawn which was less than 0.22  and her basic metabolic panel as well as  hepatic function panel were also within normal limits.  CBC was done which  was notable for leukopenia of 2.7. Hematocrit was 35.9 (range being 36-46)  with a normal hemoglobin of 12.3.  The rest of her CBC was essentially  within normal limits.  The hospitalist service was asked to evaluate for  admission.   PAST MEDICAL HISTORY:  As noted above.   FAMILY HISTORY:  Positive for heart disease in her father who died in his  11's from heart attack.   SOCIAL HISTORY:  The patient is an OR Engineer, civil (consulting) in the [employer].  She does not smoke cigarettes nor drink alcohol.   REVIEW OF SYSTEMS:  As noted above; otherwise unremarkable.   PHYSICAL EXAMINATION:  VITAL SIGNS:  Blood pressure 120/82, pulse 59,  temperature 97.2 degrees Fahrenheit, respirations 18, oxygen saturation 98%  on room air.  GENERAL:  Mild to moderate painful distress.  HEENT:  Normocephalic, atraumatic.  Pupils were equal, round, reactive to  light.  Extraocular  movements intact. Oropharynx was moist.  NECK:  Supple.  No JVD appreciated.  LUNGS:  Clear to auscultation. There was no reproducible chest wall  tenderness.  CARDIAC:  Regular rate and rhythm.  No gross murmur.  ABDOMEN:  The patient did not have any significant epigastric tenderness.  Abdomen was soft.  Bowel sounds were present.  EXTREMITIES:  No cyanosis, no edema.  CNS:  Nonfocal.   LABORATORY DATA:  As noted above, 12-lead EKG was not available for my  review but has been said to be within normal limits per ED physician.   IMPRESSION:  Chest pain, quite atypical but seems to be very bothersome at  this time.  The patient has history of gastroesophageal reflux disease but  she said that this is very different from her gastroesophageal reflux  disease symptoms.   PLAN:  Admit the patient for serial cardiac enzymes.  Will also give her  some Protonix with GI cocktail and some Maalox.  I have asked for  cardiology  consult on call to evaluate further so that we will be able to know whether  we can get a stress test for patient done tomorrow since tomorrow is a  weekend.  In view that her D-dimer is negative, I am not going to do any CT  scan.  The patient has had some pleuritic pain apparently, but she says that  was initially part of the pain and does not have any such characteristics of  her pain at the moment.  Further recommendations will be instituted as the  database expands.      Jackie Plum, M.D.  Electronically Signed     GO/MEDQ  D:  08/28/2005  T:  08/28/2005  Job:  [id number]   cc:   C. Duane Lope, M.D.  Fax: [phone]

## 2010-09-19 NOTE — Consult Note (Signed)
NAME:  Caitlin Walker, Caitlin Walker NO.:  [id number]   MEDICAL RECORD NO.:  [id number]          PATIENT TYPE:  INP   LOCATION:  1404                         FACILITY:  [facility]   PHYSICIAN:  Jonelle Sidle, M.D. LHCDATE OF BIRTH:  07-21-1945   DATE OF CONSULTATION:  DATE OF DISCHARGE:                                   CONSULTATION   DATE OF CONSULTATION:  August 28, 2005.   REQUESTING PHYSICIAN:  Melrose Nakayama. Osei-Bonsu, MD.   PRIMARY CARE PHYSICIAN:  C. Duane Lope, MD, for [facility].   PRIMARY CARDIOLOGIST:  Patient is new to [facility] and is being  seen by Dr. Diona Browner.   PROBLEM LIST:  1.  Chest pain.  2.  Hypertension.  3.  History of uterine fibroids, status post TAH BSO, February 25, 2000.  4.  GERD.  5.  Hemorrhoids with otherwise normal colonoscopy in August 2004.  6.  History of stress testing in Oct 08, 1999, which was reportedly normal.  7.  History of appendectomy in 10-08-1987.   HISTORY OF PRESENT ILLNESS:  A 65 year old, African-American female with no  prior history of CAD.  She reports that surrounding her father's death in  1999/10/08, she did have some episodic chest pain and was seen by a cardiologist  (she believes this was Dr. Meade Maw) and underwent stress testing,  which she was told was normal and subsequently told that more than likely  she had an anxiety attack.  She was in her usual state of health until  approximately 10 a.m. 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  She promptly presented to the  [facility] ED where over the past seven hours she has been treated with a  total of morphine 6 mg IV, GI cocktail, Protonix IV, and Zofran with only  minimal reduction in chest pain to 6 out of 10 where she is right now.  She  feels slightly short of breath now as well.  Cardiac markers are negative,  and ECG is within  normal limits.   ALLERGIES:  SULFA.   MEDICATIONS:  1.  Aspirin 81 mg daily.  2.  HCTZ 12.5 mg daily.  3.  Multivitamin one daily.  4.  Calcium daily.  5.  Ambien 5 mg q.h.s. p.r.n.  6.  Here, she is on Protonix 40 mg IV b.i.d.   FAMILY HISTORY:  Mother died of hemorrhage in childbirth at age 73.  Father  died of an MI at age 69.   SOCIAL HISTORY:  She lives in [place] with her husband.  She has one  grown child.  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  She has never  smoked cigarettes and has not used alcohol or drugs, and walks approximately  three days a week.   REVIEW OF SYSTEMS:  Positive for chest pain and nausea.  All other systems  are reviewed and negative.   PHYSICAL EXAMINATION:  VITAL SIGNS:  Temperature  97.2, heart rate 59,  respirations 18, blood pressure 140/79 on the right, 128/78 on the left.  GENERAL:  A pleasant, African-American female in no acute distress, awake,  alert, and oriented x3.  She appears uncomfortable in pain.  NECK:  Normal carotid upstroke, no bruits or JVD.  LUNGS:  Respirations were unlabored and clear to auscultation.  CARDIAC:  Regular S1 and S2, no S3, S4, murmurs.  ABDOMEN:  Round, soft, nontender, nondistended.  Bowel sounds present x4.  EXTREMITIES:  Warm, dry, pink.  No clubbing, cyanosis, or edema.  Dorsalis  pedis and posterior tibial pulses 2+ bilaterally.   Chest x-ray from April 27th shows no active cardiopulmonary disease.  EKG  shows sinus bradycardia with a rate of 53 and a normal axis, no STT changes.   LABORATORY DATA:  Hemoglobin 12.3, hematocrit 35.9, WBC 2.7, platelets 233.  Sodium 140, potassium 3.7, chloride 105, CO2 31, BUN 6, creatinine 0.6,  glucose 98.  Total bilirubin 0.7, alkaline-phosphatase 59, AST 26, ALT 15,  lipase 20, total protein 6.7, albumin 3.9.  CK 254, MB 2.8, troponin-I 0.03,  D-dimer less than 0.22, calcium 9.5, neutrophils 31.   ASSESSMENT AND PLAN:  1.  Chest pain.  The patient has  persistent and sharp chest pain that has      not changed with GI cocktail, intravenous Protonix, morphine,      positioning, or palpation.  Electrocardiogram is without acute changes,      and the cardiac markers are negative to date. Abdomen is benign with      normal liver function tests and lipase.  Continue to cycle cardiac      markers.  If enzymes remain negative and chest pain resolves, we would      like to plan an outpatient functional study, however, if she were to      have persistent chest pain to completely exclude a cardiac source, she      would likely require a catheterization.  We will check a chest CT to      rule out dissection.  D-dimer is less than 0.22, virtually excluding      pulmonary embolus.  2.  Hypertension, stable.  Continue HCTZ.  3.  Neutropenia.  WBC 2.7, neutrophils are 31.  Would recommend Hematology      evaluation.  4.  Gastroesophageal reflux disease.  Continue proton pump inhibitor.      Question if peptic ulcer disease is playing a role here, although a GI      cocktail should have helped.      Ok Anis, NP    ______________________________  Jonelle Sidle, M.D. [facility]    CRB/MEDQ  D:  08/28/2005  T:  08/30/2005  Job:  [id number]

## 2010-09-19 NOTE — Discharge Summary (Signed)
NAME:  Caitlin Walker, Caitlin Walker            ACCOUNT NO.:  [id number]   MEDICAL RECORD NO.:  [id number]          PATIENT TYPE:  INP   LOCATION:  1404                         FACILITY:  [facility]   PHYSICIAN:  Corinna L. Lendell Caprice, MDDATE OF BIRTH:  [birth year]   DATE OF ADMISSION:  08/28/2005  DATE OF DISCHARGE:  08/29/2005                                 DISCHARGE SUMMARY   DISCHARGE DIAGNOSES:  1.  Chest pain, pulmonary embolus, myocardial infarction and aortic aneurysm      ruled out.  2.  Gastroesophageal reflux disease.  3.  Hypertension.   DISCHARGE MEDICATIONS:  She is to continue on aspirin a day. Hold the  hydrochlorothiazide because the patient reports dizziness and relatively low  blood pressure. I told her to resume Prilosec as previous.   FOLLOW UP:  Follow up with [facility] for outpatient stress test.   CONSULTATIONS:  [facility].   CONDITION:  Stable.   ACTIVITY:  Ad lib.   DIET:  Low salt.   PERTINENT LABORATORY DATA:  Initial white count was 2.7 with 31%  neutrophils, 54% lymphocytes. The rest of her CBC was unremarkable. Repeat  white count was 3.0 with 41% neutrophils, 48% lymphocytes, erythrocyte  sedimentation rate 8. D-dimer less than 0.22. Compete metabolic panel  unremarkable. Lipase 20. Point of care enzymes negative. CPK 254, MB 2.8,  troponin 0.3, repeat troponin negative. EKG showed normal sinus rhythm.  Chest x-ray negative. CT angiogram of the chest showed no PE, no aneurysm or  dissection.   HISTORY/HOSPITAL COURSE:  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 Please see H&P for  complete details. It was atypical but she was admitted to telemetry where  she ruled out for MI. Cardiology was consulted and recommended a CT  angiogram which was negative. They also recommended outpatient stress test  but felt that this  was very atypical. The patient reports that she has been  on Prilosec in the past for reflux and I have  asked her to continue this. She also thinks that this is related to her  hydrochlorothiazide which apparently has been making her dizzy and her blood  pressure has been as low as 90/50. Apparently, it is usually quite a bit  higher than this. I have asked her to hold this for now. She will need to  call [facility] on Monday for a stress test.      Corinna L. Lendell Caprice, MD  Electronically Signed     CLS/MEDQ  D:  08/29/2005  T:  08/30/2005  Job:  [id number]   cc:   C. Duane Lope, M.D.  Fax: [phone]   [facility]

## 2010-09-19 NOTE — H&P (Signed)
[facility]  Patient:    Caitlin Walker, Caitlin Walker                MRN: [id number] Adm. Date:  [id number] Attending:  Collene Schlichter                         History and Physical  CHIEF COMPLAINT:  Fibroids.  HISTORY OF PRESENT ILLNESS:  The patient is a gravida 3, para 3, who is in menopause who has been followed in our office over several years.  She has declined hormone replacement therapy and several medroxyprogesterone acetate challenges have been negative.  She did undergo hysteroscopy D&C for some bleeding in October 1999 with benign changes.  Since that time she has had no abnormal bleeding.  Her uterus has enlarged to approximately 14-16 weeks size. She is admitted at this time for abdominal hysterectomy, probable bilateral salpingo-oophorectomy, possible anterior and posterior colporrhaphy because of uterovaginal prolapse and cystocele and rectocele.  She has been fully counseled at to the nature of the procedure and the risks involved to include risk of anesthesia, injury to uterus, tubes, ovaries, bowel, bladder, blood vessel, ureters, postoperative hemorrhage, infection, recuperation, and the possibility of hormone replacement.  She fully understands all these considerations and wishes to proceed on 25 February 2000.  PAST MEDICAL HISTORY:  Includes gastroesophageal reflux disease.  She is otherwise normal.  FAMILY HISTORY:  Two brothers with prostate cancer, otherwise negative.  SOCIAL HISTORY:  The patient is a non smoker.  REVIEW OF SYSTEMS:  HEENT:  Negative.  CARDIORESPIRATORY:  Negative. GASTROINTESTINAL:  As noted above.  GENITOURINARY:  As noted above except in addition she has nocturia and urgency.  NEUROMUSCULAR:  Negative.  PHYSICAL EXAMINATION:  VITAL SIGNS:  Height 5 feet 4 inches, weight 152 pounds.  Blood pressure 98/62, pulse 72, respirations 18.  GENERAL:  A well-developed black female in no acute distress.  HEENT:   Within normal limits.  NECK:  Supple without masses, adenopathy or bruits.  HEART:  Regular rate and rhythm without murmurs.  LUNGS:  Clear to P&A.  BREASTS:  Examination sitting and lying without mass.  Axilla negative.  ABDOMEN:  Flat and soft with no mass effect in the lower abdomen with an irregular mass.  PELVIC:  External genitalia:  Bartholins, urethral, and Skenes glands within normal limits.  Cervix slightly inflamed.  Uterus in the midposition, irregular and approximately 14-16 weeks size and tender.  Adnexa without palpable masses.  Rectovaginal examination confirmatory.  EXTREMITIES:  Within normal limits.  CENTRAL NERVOUS SYSTEM:  Grossly intact.  SKIN:  Without suspicious lesions.  IMPRESSION:  Enlarging leiomyoma uteri which are symptomatic.  DISPOSITION:  As noted above.  Pap smear within the last year has been negative. DD:  02/25/00 TD:  02/25/00 Job: [id number]

## 2010-09-22 ENCOUNTER — Inpatient Hospital Stay (INDEPENDENT_AMBULATORY_CARE_PROVIDER_SITE_OTHER)
Admission: RE | Admit: 2010-09-22 | Discharge: 2010-09-22 | Disposition: A | Discharge status: Home / Self Care | Payer: 59 | Source: Ambulatory Visit | Attending: Family Medicine | Admitting: Family Medicine

## 2010-09-22 ENCOUNTER — Ambulatory Visit (INDEPENDENT_AMBULATORY_CARE_PROVIDER_SITE_OTHER): Payer: 59

## 2010-09-22 DIAGNOSIS — M79609 Pain in unspecified limb: Secondary | ICD-10-CM

## 2010-11-20 ENCOUNTER — Encounter: Payer: Self-pay | Admitting: Cardiovascular Disease

## 2010-11-20 ENCOUNTER — Ambulatory Visit (INDEPENDENT_AMBULATORY_CARE_PROVIDER_SITE_OTHER): Payer: 59 | Admitting: Cardiovascular Disease

## 2010-11-20 VITALS — BP 120/80 | HR 60 | Ht 64.0 in | Wt 154.4 lb

## 2010-11-20 DIAGNOSIS — I10 Essential (primary) hypertension: Secondary | ICD-10-CM

## 2010-11-20 DIAGNOSIS — R079 Chest pain, unspecified: Secondary | ICD-10-CM | POA: Insufficient documentation

## 2010-11-20 LAB — BASIC METABOLIC PANEL WITH GFR
BUN: 12 mg/dL (ref 6–23)
CO2: 34 meq/L — ABNORMAL HIGH (ref 19–32)
Calcium: 10 mg/dL (ref 8.4–10.5)
Chloride: 102 meq/L (ref 96–112)
Creatinine, Ser: 0.7 mg/dL (ref 0.4–1.2)
GFR: 106.93 mL/min
Glucose, Bld: 97 mg/dL (ref 70–99)
Potassium: 4.6 meq/L (ref 3.5–5.1)
Sodium: 144 meq/L (ref 135–145)

## 2010-11-20 NOTE — Assessment & Plan Note (Signed)
Her blood pressure is very well controlled. We will draw a basic ulcer again in 6 months.metabolic profile today.

## 2010-11-20 NOTE — Progress Notes (Signed)
Caitlin Walker Date of Birth  11/28/1945 [facility] [address]     [address] [place], [place]  [address] [phone]  Fax  [phone]  History of Present Illness:  Pt with a hx of HTN and intermittant atypical chest pain.  Walks several times a week.  She has overall done very well. She's been exercising without any difficulties.  Her blood pressure is much better controlled after we added  Losartan. She's tolerating the medicine quite well.  Current Outpatient Prescriptions on File Prior to Visit  Medication Sig Dispense Refill  . Calcium Carbonate-Vitamin D (CALCIUM-VITAMIN D) 500-200 MG-UNIT per tablet Take 1 tablet by mouth 2 (two) times daily with a meal.  60 tablet  11  . calcium citrate-vitamin D 200-200 MG-UNIT TABS Take 1 tablet by mouth daily.        Marland Kitchen docusate sodium (COLACE) 250 MG capsule Take 1 capsule (250 mg total) by mouth 2 (two) times daily as needed for constipation.  10 capsule  0  . hydrochlorothiazide 25 MG tablet Take 1 tablet (25 mg total) by mouth daily.  30 tablet  11  . hydrochlorothiazide 25 MG tablet Take 25 mg by mouth daily.        Marland Kitchen losartan (COZAAR) 50 MG tablet Take 1 tablet (50 mg total) by mouth daily.  30 tablet  11  . zolpidem (AMBIEN CR) 12.5 MG CR tablet Take 1 tablet (12.5 mg total) by mouth at bedtime as needed for sleep.  30 tablet  1  . zolpidem (AMBIEN CR) 12.5 MG CR tablet Take 12.5 mg by mouth at bedtime as needed.          Allergies  Allergen Reactions  . Bactrim Rash  . Flomax (Tamsulosin Hcl) Rash    Past Medical History  Diagnosis Date  . Hypertension   . Personal history of kidney stones 2010    cystoscopy done right ureter unable to pass scope, passed stone on own  . Chest pain at rest     Past Surgical History  Procedure Date  . Appendectomy     History  Smoking status  . Never Smoker   Smokeless tobacco  . Not on file    History  Alcohol Use No    Family  History  Problem Relation Age of Onset  . Heart disease Father   . Hypertension Father   . Hypertension Sister   . Hyperlipidemia Sister   . Hypertension Brother   . Hypertension Sister   . Colon cancer Sister   . Hypertension Sister   . Hypertension Sister   . Hypertension Sister   . Hyperlipidemia Sister   . Hypertension Brother   . Hypertension Brother   . Hyperlipidemia Brother     Reviw of Systems:  Reviewed in the HPI.  All other systems are negative.  Physical Exam: BP 120/80  Pulse 60  Ht 5\' 4"  (1.626 m)  Wt 154 lb 6.4 oz (70.035 kg)  BMI 26.50 kg/m2 The patient is alert and oriented x 3.  The mood and affect are normal.   Skin: warm and dry.  Color is normal.    HEENT:   the sclera are nonicteric.  The mucous membranes are moist.  The carotids are 2+ without bruits.  There is no thyromegaly.  There is no JVD.    Lungs: clear.  The chest wall is non tender.    Heart: regular rate with a normal S1 and  S2.  There are no murmurs, gallops, or rubs. The PMI is not displaced.     Abdomin: good bowel sounds.  There is no guarding or rebound.  There is no hepatosplenomegaly or tenderness.  There are no masses.   Extremities:  no clubbing, cyanosis, or edema.  The legs are without rashes.  The distal pulses are intact.   Neuro:  Cranial nerves II - XII are intact.  Motor and sensory functions are intact.    The gait is normal.  Assessment / Plan:

## 2010-11-20 NOTE — Assessment & Plan Note (Signed)
She has intermittent episodes of chest discomfort. These episodes are very atypical. It only lasts for a second or 2. They're never associated with exertion. I do not think that these are angina.

## 2010-11-21 ENCOUNTER — Telehealth: Payer: Self-pay | Admitting: *Deleted

## 2010-11-21 NOTE — Telephone Encounter (Signed)
Message copied by Antony Odea on Fri Nov 21, 2010  3:36 PM ------      Message from: Vesta Mixer      Created: Fri Nov 21, 2010 12:02 PM       Normal

## 2010-11-21 NOTE — Telephone Encounter (Signed)
Patient called with lab results. msg left, Jodette  RN  

## 2011-01-29 LAB — POCT I-STAT, CHEM 8
BUN: 11
Calcium, Ion: 1.2
Chloride: 98
Creatinine, Ser: 0.8
Glucose, Bld: 68 — ABNORMAL LOW
HCT: 43
Hemoglobin: 14.6
Potassium: 3.8
Sodium: 137
TCO2: 37

## 2011-01-29 LAB — POCT URINALYSIS DIP (DEVICE)
Bilirubin Urine: NEGATIVE
Glucose, UA: NEGATIVE
Ketones, ur: NEGATIVE
Nitrite: NEGATIVE
Operator id: 282151
Protein, ur: NEGATIVE
Specific Gravity, Urine: 1.01
Urobilinogen, UA: 0.2
pH: 7.5

## 2011-05-19 ENCOUNTER — Ambulatory Visit (INDEPENDENT_AMBULATORY_CARE_PROVIDER_SITE_OTHER): Payer: 59 | Admitting: Cardiovascular Disease

## 2011-05-19 ENCOUNTER — Encounter: Payer: Self-pay | Admitting: Cardiovascular Disease

## 2011-05-19 VITALS — BP 146/87 | HR 53 | Ht 65.0 in | Wt 155.0 lb

## 2011-05-19 DIAGNOSIS — I10 Essential (primary) hypertension: Secondary | ICD-10-CM

## 2011-05-19 LAB — BASIC METABOLIC PANEL WITH GFR
BUN: 7 mg/dL (ref 6–23)
CO2: 30 meq/L (ref 19–32)
Calcium: 9.4 mg/dL (ref 8.4–10.5)
Chloride: 101 meq/L (ref 96–112)
Creatinine, Ser: 0.7 mg/dL (ref 0.4–1.2)
GFR: 106.76 mL/min
Glucose, Bld: 87 mg/dL (ref 70–99)
Potassium: 3.7 meq/L (ref 3.5–5.1)
Sodium: 140 meq/L (ref 135–145)

## 2011-05-19 MED ORDER — LOSARTAN POTASSIUM 100 MG PO TABS: 100.0000 mg | ORAL_TABLET | Freq: Every day | ORAL | Status: DC

## 2011-05-19 MED ORDER — HYDROCHLOROTHIAZIDE 12.5 MG PO CAPS: 12.5000 mg | ORAL_CAPSULE | Freq: Two times a day (BID) | ORAL | Status: DC

## 2011-05-19 NOTE — Progress Notes (Signed)
    Caitlin Walker Date of Birth  [birth year] [facility]     [place] [facility]  [address]    [address]   [address] [place], [place]  [address]    [place], [place]  [address] [phone]  Fax  [phone]  [phone]  Fax [phone]   History of Present Illness:  Caitlin Walker is a 66 year old female with a history of hypertension. She has done very well. She's not having episodes of chest pain or shortness of breath. She avoids eating any extra salt.  She exercises several times a week.  Current Outpatient Prescriptions on File Prior to Visit  Medication Sig Dispense Refill  . Calcium Carbonate-Vitamin D (CALCIUM-VITAMIN D) 500-200 MG-UNIT per tablet Take 1 tablet by mouth 2 (two) times daily with a meal.  60 tablet  11  . calcium citrate-vitamin D 200-200 MG-UNIT TABS Take 1 tablet by mouth daily.        Marland Kitchen docusate sodium (COLACE) 250 MG capsule Take 1 capsule (250 mg total) by mouth 2 (two) times daily as needed for constipation.  10 capsule  0  . losartan (COZAAR) 50 MG tablet Take 1 tablet (50 mg total) by mouth daily.  30 tablet  11  . zolpidem (AMBIEN CR) 12.5 MG CR tablet Take 1 tablet (12.5 mg total) by mouth at bedtime as needed for sleep.  30 tablet  1    Allergies  Allergen Reactions  . Bactrim Rash  . Flomax (Tamsulosin Hcl) Rash    Past Medical History  Diagnosis Date  . Hypertension   . Personal history of kidney stones 2010    cystoscopy done right ureter unable to pass scope, passed stone on own  . Chest pain at rest     Past Surgical History  Procedure Date  . Appendectomy     History  Smoking status  . Never Smoker   Smokeless tobacco  . Not on file    History  Alcohol Use No    Family History  Problem Relation Age of Onset  . Heart disease Father   . Hypertension Father   . Hypertension Sister   . Hyperlipidemia Sister   . Hypertension Brother   . Hypertension Sister   . Colon cancer Sister   . Hypertension Sister    . Hypertension Sister   . Hypertension Sister   . Hyperlipidemia Sister   . Hypertension Brother   . Hypertension Brother   . Hyperlipidemia Brother     Reviw of Systems:  Reviewed in the HPI.  All other systems are negative.  Physical Exam: BP 146/87  Pulse 53  Ht 5\' 5"  (1.651 m)  Wt 155 lb (70.308 kg)  BMI 25.79 kg/m2 The patient is alert and oriented x 3.  The mood and affect are normal.   Skin: warm and dry.  Color is normal.    HEENT:   Normocephalic/atraumatic. Her carotids are normal. There is no JVD.  Lungs: Lungs are clear.   Heart: Regular S1-S2.    Abdomen: Good bowel sounds. No hepatosplenomegaly.  Extremities:  No clubbing cyanosis or edema.  Neuro:  There exam is nonfocal. Her gait is normal. Cranial nerves are intact.    ECG: Sinus bradycardia. She has no ST or T wave changes.  Assessment / Plan:

## 2011-05-19 NOTE — Assessment & Plan Note (Signed)
Pounds blood pressure remains a little bit elevated. We'll increase her losartan to 100 mg a day. We'll check a basic metabolic profile today. She's currently on HCTZ and is not on potassium supplement. I'll see her again in 3 months for followup office visit and basic metabolic profile.

## 2011-05-19 NOTE — Patient Instructions (Signed)
Your physician wants you to follow-up in: 3 months You will receive a reminder letter in the mail two months in advance. If you don't receive a letter, please call our office to schedule the follow-up appointment.   Your physician recommends that you return for lab work in: today and in 3 months to check blood after med changes  Your physician has recommended you make the following change in your medication:  INCREASE LOSARTAN TO 100 MG DAY

## 2011-07-01 ENCOUNTER — Other Ambulatory Visit: Payer: Self-pay | Admitting: Family Medicine

## 2011-07-01 DIAGNOSIS — Z1231 Encounter for screening mammogram for malignant neoplasm of breast: Secondary | ICD-10-CM

## 2011-07-03 ENCOUNTER — Other Ambulatory Visit: Payer: Self-pay | Admitting: Cardiovascular Disease

## 2011-07-03 MED ORDER — HYDROCHLOROTHIAZIDE 12.5 MG PO CAPS: 12.5000 mg | ORAL_CAPSULE | Freq: Two times a day (BID) | ORAL | Status: DC

## 2011-07-03 NOTE — Telephone Encounter (Signed)
Employee Refill - [facility]  Patient would like all RX refilled at [facility]  Current will: hydrochlorothiazide (MICROZIDE) 12.5 MG capsule  Patient can be reached at wk# [phone] should there be any additional questions.

## 2011-07-03 NOTE — Telephone Encounter (Signed)
Fax Received. Refill Completed.  [place] (R.M.A)   

## 2011-08-17 ENCOUNTER — Ambulatory Visit (INDEPENDENT_AMBULATORY_CARE_PROVIDER_SITE_OTHER): Payer: 59 | Admitting: Cardiovascular Disease

## 2011-08-17 ENCOUNTER — Encounter: Payer: Self-pay | Admitting: Cardiovascular Disease

## 2011-08-17 VITALS — BP 133/85 | HR 53 | Ht 65.0 in | Wt 155.0 lb

## 2011-08-17 DIAGNOSIS — E876 Hypokalemia: Secondary | ICD-10-CM

## 2011-08-17 DIAGNOSIS — I10 Essential (primary) hypertension: Secondary | ICD-10-CM

## 2011-08-17 LAB — BASIC METABOLIC PANEL WITH GFR
BUN: 12 mg/dL (ref 6–23)
CO2: 31 meq/L (ref 19–32)
Calcium: 9.8 mg/dL (ref 8.4–10.5)
Chloride: 102 meq/L (ref 96–112)
Creatinine, Ser: 0.5 mg/dL (ref 0.4–1.2)
GFR: 146.3 mL/min
Glucose, Bld: 80 mg/dL (ref 70–99)
Potassium: 4.2 meq/L (ref 3.5–5.1)
Sodium: 141 meq/L (ref 135–145)

## 2011-08-17 NOTE — Patient Instructions (Signed)
Your physician recommends that you return for lab work in: today bmet   Your physician recommends that you schedule a follow-up appointment in: 3 months    

## 2011-08-17 NOTE — Progress Notes (Signed)
Caitlin Walker Date of Birth  11-15-1945 [facility]     [place] [facility]  [address]    [address]   [address] [place], [place]  [address]    [place], [place]  [address] [phone]  Fax  [phone]  [phone]  Fax [phone]   Problem list: 1. Hypertension   History of Present Illness:  Caitlin Walker is a 66 year old female with a history of hypertension. She has done very well. She's not having episodes of chest pain or shortness of breath. She avoids eating any extra salt.  She exercises several times a week.  We increased her losartan up to 100 mg a day during her last visit. She is on HCTZ. Her last potassium level was 3.7.  Current Outpatient Prescriptions on File Prior to Visit  Medication Sig Dispense Refill  . Calcium Carbonate-Vitamin D (CALCIUM + D PO) Take by mouth. Taking 600 mg of Calcium 200 Units Vitamin D      . docusate sodium (COLACE) 250 MG capsule Take 1 capsule (250 mg total) by mouth 2 (two) times daily as needed for constipation.  10 capsule  0  . losartan (COZAAR) 100 MG tablet Take 1 tablet (100 mg total) by mouth daily.  90 tablet  3  . zolpidem (AMBIEN CR) 12.5 MG CR tablet Take 1 tablet (12.5 mg total) by mouth at bedtime as needed for sleep.  30 tablet  1  . DISCONTD: hydrochlorothiazide (MICROZIDE) 12.5 MG capsule Take 1 capsule (12.5 mg total) by mouth 2 (two) times daily.  180 capsule  3    Allergies  Allergen Reactions  . Bactrim Rash  . Flomax (Tamsulosin Hcl) Rash    Past Medical History  Diagnosis Date  . Hypertension   . Personal history of kidney stones 2010    cystoscopy done right ureter unable to pass scope, passed stone on own  . Chest pain at rest     Past Surgical History  Procedure Date  . Appendectomy     History  Smoking status  . Never Smoker   Smokeless tobacco  . Not on file    History  Alcohol Use No    Family History  Problem Relation Age of Onset  . Heart disease Father   .  Hypertension Father   . Hypertension Sister   . Hyperlipidemia Sister   . Hypertension Brother   . Hypertension Sister   . Colon cancer Sister   . Hypertension Sister   . Hypertension Sister   . Hypertension Sister   . Hyperlipidemia Sister   . Hypertension Brother   . Hypertension Brother   . Hyperlipidemia Brother     Reviw of Systems:  Reviewed in the HPI.  All other systems are negative.  Physical Exam: BP 133/85  Pulse 53  Ht 5\' 5"  (1.651 m)  Wt 155 lb (70.308 kg)  BMI 25.79 kg/m2 The patient is alert and oriented x 3.  The mood and affect are normal.   Skin: warm and dry.  Color is normal.    HEENT:   Normocephalic/atraumatic. Her carotids are normal. There is no JVD.  Lungs: Lungs are clear.   Heart: Regular S1-S2.    Abdomen: Good bowel sounds. No hepatosplenomegaly.  Extremities:  No clubbing cyanosis or edema.  Neuro:  There exam is nonfocal. Her gait is normal. Cranial nerves are intact.    ECG: Sinus bradycardia. She has no ST or T wave changes.  Assessment / Plan:

## 2011-08-17 NOTE — Assessment & Plan Note (Signed)
Caitlin Walker is doing much better. Her blood pressure is much better control. Last potassium level was only 3.7. We'll measure her potassium today. It is likely that she'll need a potassium supplement with her HCTZ.  I'll see her in 3 months for an office visit and basic profile.

## 2011-09-21 ENCOUNTER — Ambulatory Visit
Admission: RE | Admit: 2011-09-21 | Discharge: 2011-09-21 | Disposition: A | Discharge status: Home / Self Care | Payer: 59 | Source: Ambulatory Visit | Attending: Family Medicine | Admitting: Family Medicine

## 2011-09-21 DIAGNOSIS — Z1231 Encounter for screening mammogram for malignant neoplasm of breast: Secondary | ICD-10-CM

## 2011-09-23 ENCOUNTER — Other Ambulatory Visit: Payer: Self-pay | Admitting: Family Medicine

## 2011-09-23 DIAGNOSIS — R928 Other abnormal and inconclusive findings on diagnostic imaging of breast: Secondary | ICD-10-CM

## 2011-10-02 ENCOUNTER — Ambulatory Visit
Admission: RE | Admit: 2011-10-02 | Discharge: 2011-10-02 | Disposition: A | Discharge status: Home / Self Care | Payer: 59 | Source: Ambulatory Visit | Attending: Family Medicine | Admitting: Family Medicine

## 2011-10-02 DIAGNOSIS — R928 Other abnormal and inconclusive findings on diagnostic imaging of breast: Secondary | ICD-10-CM

## 2011-11-20 ENCOUNTER — Encounter: Payer: Self-pay | Admitting: Cardiovascular Disease

## 2011-11-20 ENCOUNTER — Ambulatory Visit (INDEPENDENT_AMBULATORY_CARE_PROVIDER_SITE_OTHER): Payer: 59 | Admitting: Cardiovascular Disease

## 2011-11-20 VITALS — BP 128/86 | HR 80 | Ht 65.0 in | Wt 155.0 lb

## 2011-11-20 DIAGNOSIS — I10 Essential (primary) hypertension: Secondary | ICD-10-CM

## 2011-11-20 LAB — BASIC METABOLIC PANEL WITH GFR
BUN: 12 mg/dL (ref 6–23)
CO2: 32 meq/L (ref 19–32)
Calcium: 9.7 mg/dL (ref 8.4–10.5)
Chloride: 104 meq/L (ref 96–112)
Creatinine, Ser: 0.7 mg/dL (ref 0.4–1.2)
GFR: 112.03 mL/min
Glucose, Bld: 92 mg/dL (ref 70–99)
Potassium: 4.3 meq/L (ref 3.5–5.1)
Sodium: 141 meq/L (ref 135–145)

## 2011-11-20 NOTE — Progress Notes (Signed)
     [name] Date of Birth  04/21/1946 [facility]     [place] [facility]  [address]    [address]   [address] [place], [place]  [address]    [place], [place]  [address] [phone]  Fax  [phone]  [phone]  Fax [phone]   Problem list: 1. Hypertension 2. Insomnia  History of Present Illness:  Ashonti is a 66 year old female with a history of hypertension. She has done very well. She's not having episodes of chest pain or shortness of breath. She avoids eating any extra salt.  She exercises several times a week.  She is having trouble with insomnia.  Current Outpatient Prescriptions on File Prior to Visit  Medication Sig Dispense Refill  . aspirin 81 MG tablet Take 81 mg by mouth daily.      . Calcium Carbonate-Vitamin D (CALCIUM + D PO) Take by mouth. Taking 600 mg of Calcium 200 Units Vitamin D      . docusate sodium (COLACE) 250 MG capsule Take 1 capsule (250 mg total) by mouth 2 (two) times daily as needed for constipation.  10 capsule  0  . hydrochlorothiazide (MICROZIDE) 12.5 MG capsule Take 25 mg by mouth every morning.      Marland Kitchen losartan (COZAAR) 100 MG tablet Take 1 tablet (100 mg total) by mouth daily.  90 tablet  3  . zolpidem (AMBIEN CR) 12.5 MG CR tablet Take 1 tablet (12.5 mg total) by mouth at bedtime as needed for sleep.  30 tablet  1    Allergies  Allergen Reactions  . Bactrim Rash  . Flomax (Tamsulosin Hcl) Rash    Past Medical History  Diagnosis Date  . Hypertension   . Personal history of kidney stones 2010    cystoscopy done right ureter unable to pass scope, passed stone on own  . Chest pain at rest     Past Surgical History  Procedure Date  . Appendectomy     History  Smoking status  . Never Smoker   Smokeless tobacco  . Not on file    History  Alcohol Use No    Family History  Problem Relation Age of Onset  . Heart disease Father   . Hypertension Father   . Hypertension Sister   .  Hyperlipidemia Sister   . Hypertension Brother   . Hypertension Sister   . Colon cancer Sister   . Hypertension Sister   . Hypertension Sister   . Hypertension Sister   . Hyperlipidemia Sister   . Hypertension Brother   . Hypertension Brother   . Hyperlipidemia Brother     Reviw of Systems:  Reviewed in the HPI.  All other systems are negative.  Physical Exam: BP 128/86  Pulse 80  Ht 5\' 5"  (1.651 m)  Wt 155 lb (70.308 kg)  BMI 25.79 kg/m2 The patient is alert and oriented x 3.  The mood and affect are normal.   Skin: warm and dry.  Color is normal.    HEENT:   Normocephalic/atraumatic. Her carotids are normal. There is no JVD.  Lungs: Lungs are clear.   Heart: Regular S1-S2.    Abdomen: Good bowel sounds. No hepatosplenomegaly.  Extremities:  No clubbing cyanosis or edema.  Neuro:  There exam is nonfocal. Her gait is normal. Cranial nerves are intact.    ECG:  Assessment / Plan:

## 2011-11-20 NOTE — Patient Instructions (Addendum)
Orbie Hurst - health and wellness nurse [phone]  Your physician recommends that you return for lab work in: today, bmet  Your physician wants you to follow-up in: 6 months You will receive a reminder letter in the mail two months in advance. If you don't receive a letter, please call our office to schedule the follow-up appointment.

## 2011-11-20 NOTE — Assessment & Plan Note (Signed)
Caitlin Walker is doing quite well. She's tolerating her telmisartan and HCTZ without any problems. Her blood pressure is well-controlled. She exercises 3 times a week. I've encouraged her to walk 5-7 days a week.  She's having trouble with insomnia. We will refer her to Orbie Hurst ( a wellness nurse) for relaxation techniques.

## 2012-03-12 ENCOUNTER — Emergency Department (HOSPITAL_COMMUNITY)
Admission: EM | Admit: 2012-03-12 | Discharge: 2012-03-12 | Disposition: A | Discharge status: Home / Self Care | Payer: 59 | Source: Home / Self Care

## 2012-03-12 ENCOUNTER — Encounter (HOSPITAL_COMMUNITY): Payer: Self-pay

## 2012-03-12 DIAGNOSIS — J111 Influenza due to unidentified influenza virus with other respiratory manifestations: Secondary | ICD-10-CM

## 2012-03-12 MED ORDER — DEXTROMETHORPHAN HBR 15 MG/5ML PO SYRP: 10.0000 mL | ORAL_SOLUTION | Freq: Four times a day (QID) | ORAL | Status: DC | PRN

## 2012-03-12 MED ORDER — GUAIFENESIN-CODEINE 100-10 MG/5ML PO SYRP: 5.0000 mL | ORAL_SOLUTION | Freq: Three times a day (TID) | ORAL | Status: DC | PRN

## 2012-03-12 NOTE — Discharge Instructions (Signed)
 Use codeine  cough syrup only at night as will cause drowsiness. Do not drive with this medication. Use dextromethorphan  during day.   Influenza Facts Flu (influenza) is a contagious respiratory illness caused by the influenza viruses. It can cause mild to severe illness. While most healthy people recover from the flu without specific treatment and without complications, older people, young children, and people with certain health conditions are at higher risk for serious complications from the flu, including death. CAUSES   The flu virus is spread from person to person by respiratory droplets from coughing and sneezing.  A person can also become infected by touching an object or surface with a virus on it and then touching their mouth, eye or nose.  Adults may be able to infect others from 1 day before symptoms occur and up to 7 days after getting sick. So it is possible to give someone the flu even before you know you are sick and continue to infect others while you are sick. SYMPTOMS   Fever (usually high).  Headache.  Tiredness (can be extreme).  Cough.  Sore throat.  Runny or stuffy nose.  Body aches.  Diarrhea and vomiting may also occur, particularly in children.  These symptoms are referred to as flu-like symptoms. A lot of different illnesses, including the common cold, can have similar symptoms. DIAGNOSIS   There are tests that can determine if you have the flu as long you are tested within the first 2 or 3 days of illness.  A doctor's exam and additional tests may be needed to identify if you have a disease that is a complicating the flu. RISKS AND COMPLICATIONS  Some of the complications caused by the flu include:  Bacterial pneumonia or progressive pneumonia caused by the flu virus.  Loss of body fluids (dehydration).  Worsening of chronic medical conditions, such as heart failure, asthma, or diabetes.  Sinus problems and ear infections. HOME CARE  INSTRUCTIONS   Seek medical care early on.  If you are at high risk from complications of the flu, consult your health-care provider as soon as you develop flu-like symptoms. Those at high risk for complications include:  People 65 years or older.  People with chronic medical conditions, including diabetes.  Pregnant women.  Young children.  Your caregiver may recommend use of an antiviral medication to help treat the flu.  If you get the flu, get plenty of rest, drink a lot of liquids, and avoid using alcohol and tobacco.  You can take over-the-counter medications to relieve the symptoms of the flu if your caregiver approves. (Never give aspirin  to children or teenagers who have flu-like symptoms, particularly fever). PREVENTION  The single best way to prevent the flu is to get a flu vaccine each fall. Other measures that can help protect against the flu are:  Antiviral Medications  A number of antiviral drugs are approved for use in preventing the flu. These are prescription medications, and a doctor should be consulted before they are used.  Habits for Good Health  Cover your nose and mouth with a tissue when you cough or sneeze, throw the tissue away after you use it.  Wash your hands often with soap and water, especially after you cough or sneeze. If you are not near water, use an alcohol-based hand cleaner.  Avoid people who are sick.  If you get the flu, stay home from work or school. Avoid contact with other people so that you do not make them sick,  too.  Try not to touch your eyes, nose, or mouth as germs ore often spread this way. IN CHILDREN, EMERGENCY WARNING SIGNS THAT NEED URGENT MEDICAL ATTENTION:  Fast breathing or trouble breathing.  Bluish skin color.  Not drinking enough fluids.  Not waking up or not interacting.  Being so irritable that the child does not want to be held.  Flu-like symptoms improve but then return with fever and worse  cough.  Fever with a rash. IN ADULTS, EMERGENCY WARNING SIGNS THAT NEED URGENT MEDICAL ATTENTION:  Difficulty breathing or shortness of breath.  Pain or pressure in the chest or abdomen.  Sudden dizziness.  Confusion.  Severe or persistent vomiting. SEEK IMMEDIATE MEDICAL CARE IF:  You or someone you know is experiencing any of the symptoms above. When you arrive at the emergency center,report that you think you have the flu. You may be asked to wear a mask and/or sit in a secluded area to protect others from getting sick. MAKE SURE YOU:   Understand these instructions.  Monitor your condition.  Seek medical care if you are getting worse, or not improving. Document Released: 04/23/2003 Document Revised: 07/13/2011 Document Reviewed: 01/17/2009 [facility] Patient Information 2013 [place], [place].

## 2012-03-12 NOTE — ED Provider Notes (Signed)
CC:  Cough  HPI:  66 y.o. female with cough for 6 days. Headache, chest pain. Chills x 2-3 days. Non-productive cough. Got flu vaccine just before (3 days) symptoms. Body aches. No nasal congestion or sore throat. No wheezing. SOB from coughing. Not able to sleep. Has been taking Q-tussin, doesn't help.   Past Medical History  Diagnosis Date  . Hypertension   . Personal history of kidney stones 2010    cystoscopy done right ureter unable to pass scope, passed stone on own  . Chest pain at rest     Past Surgical History  Procedure Date  . Appendectomy   . Abdominal hysterectomy   . Cystoscopy    No current facility-administered medications on file prior to encounter.   Current Outpatient Prescriptions on File Prior to Encounter  Medication Sig Dispense Refill  . aspirin 81 MG tablet Take 81 mg by mouth daily.      . calcium carbonate (OS-CAL) 600 MG TABS Take 600 mg by mouth 2 (two) times daily with a meal.      . Calcium Carbonate-Vitamin D (CALCIUM + D PO) Take by mouth. Taking 600 mg of Calcium 200 Units Vitamin D      . docusate sodium (COLACE) 250 MG capsule Take 1 capsule (250 mg total) by mouth 2 (two) times daily as needed for constipation.  10 capsule  0  . hydrochlorothiazide (MICROZIDE) 12.5 MG capsule Take 25 mg by mouth every morning.      Marland Kitchen losartan (COZAAR) 100 MG tablet Take 1 tablet (100 mg total) by mouth daily.  90 tablet  3  . zolpidem (AMBIEN CR) 12.5 MG CR tablet Take 1 tablet (12.5 mg total) by mouth at bedtime as needed for sleep.  30 tablet  1    Allergies  Allergen Reactions  . Prednisone Swelling  . Sulfa Antibiotics     rash  . Bactrim Rash  . Flomax (Tamsulosin Hcl) Rash    No smoking, no EtOH, no drugs  Fam Hx:  Dad emphysema, HTN  ROS:  Neg except as stated in HPI - no nausea/vomiting/diarrhea.  EXAM Filed Vitals:   03/12/12 1330  BP: 132/79  Pulse: 63  Temp: 98.4 F (36.9 C)  Resp: 16   Physical Examination: General appearance -  WNWD, looks like she feels bad Eyes - pupils equal and reactive, extraocular eye movements intact Mouth - mucous membranes moist, pharynx normal without lesions Neck - supple, no significant adenopathy Chest - clear to auscultation, no wheezes, rales or rhonchi, symmetric air entry Heart - normal rate, regular rhythm, normal S1, S2, no murmurs, rubs, clicks or gallops Neurological - alert, oriented, normal speech, no focal findings or movement disorder noted Skin - warm, dry   A/P 66 y.o. female with flu syndrome, cough - Codeine-guaifenisin cough syrup for night time - dextromethorphan for daytime - supprtive care  Napoleon Form, MD 03/12/2012 2:40 PM    Napoleon Form, MD 03/12/12 1442

## 2012-03-12 NOTE — ED Notes (Signed)
Pt states she received the flu shot last Thursday a week ago and started coughing on Sunday, then progressed to chills, headache and chest pain/body aches. Denies fever and states cough is nonproductive but constant.

## 2012-05-12 ENCOUNTER — Encounter: Payer: Self-pay | Admitting: Cardiovascular Disease

## 2012-05-12 ENCOUNTER — Ambulatory Visit (INDEPENDENT_AMBULATORY_CARE_PROVIDER_SITE_OTHER): Payer: 59 | Admitting: Cardiovascular Disease

## 2012-05-12 VITALS — BP 132/92 | HR 63 | Ht 65.0 in | Wt 161.4 lb

## 2012-05-12 DIAGNOSIS — I10 Essential (primary) hypertension: Secondary | ICD-10-CM

## 2012-05-12 LAB — BASIC METABOLIC PANEL WITH GFR
BUN: 9 mg/dL (ref 6–23)
CO2: 31 meq/L (ref 19–32)
Calcium: 9.3 mg/dL (ref 8.4–10.5)
Chloride: 101 meq/L (ref 96–112)
Creatinine, Ser: 0.6 mg/dL (ref 0.4–1.2)
GFR: 137.13 mL/min
Glucose, Bld: 86 mg/dL (ref 70–99)
Potassium: 3.8 meq/L (ref 3.5–5.1)
Sodium: 138 meq/L (ref 135–145)

## 2012-05-12 NOTE — Assessment & Plan Note (Signed)
Caitlin Walker is doing well from a cardiac standpoint.  She has had some leg cramps which may be due to hypokalemia. She's on hydrochlorothiazide.  We'll check a basic metabolic profile today.  She's also had some intermittent episodes of dizziness which may have been due to some orthostasis.  I've encouraged her to try to exercise on a regular basis. I've encouraged her to work on her diet in an effort to lose weight. I'll see her again in 6 months for an office visit a basic metabolic profile, hepatic profile, and lipids

## 2012-05-12 NOTE — Patient Instructions (Addendum)
Your physician recommends that you return for lab work in: TODAY// BMET   Your physician wants you to follow-up in: 6 MONTHS You will receive a reminder letter in the mail two months in advance. If you don't receive a letter, please call our office to schedule the follow-up appointment.  Your physician recommends that you return for a FASTING lipid profile: 6 MONTHS   Your physician recommends that you continue on your current medications as directed. Please refer to the Current Medication list given to you today.

## 2012-05-12 NOTE — Progress Notes (Signed)
Caitlin Walker Date of Birth  November 20, 1945 [facility]     [place] Office  [address]    [address]   [address] [place], [place]  [address]    [place], [place]  [address] [phone]  Fax  [phone]  [phone]  Fax [phone]   Problem list: 1. Hypertension 2. Insomnia  History of Present Illness:  Caitlin Walker is a 67 year old female with a history of hypertension. She has done very well. She's not having episodes of chest pain or shortness of breath. She avoids eating any extra salt.  She exercises several times a week.  She is having trouble with insomnia.  May 12, 2012: She has been doing well from a cardiac standpoint.  She has had some lightheadness.  She also has had some leg cramps.  She has not been exercising.  She has been watching her salt intake.   She still works full time in the OR.  Current Outpatient Prescriptions on File Prior to Visit  Medication Sig Dispense Refill  . aspirin 81 MG tablet Take 81 mg by mouth daily.      . calcium carbonate (OS-CAL) 600 MG TABS Take 600 mg by mouth 2 (two) times daily with a meal.      . Calcium Carbonate-Vitamin D (CALCIUM + D PO) Take by mouth. Taking 600 mg of Calcium 200 Units Vitamin D      . dextromethorphan 15 MG/5ML syrup Take 10 mLs (30 mg total) by mouth 4 (four) times daily as needed for cough.  120 mL  0  . docusate sodium (COLACE) 250 MG capsule Take 1 capsule (250 mg total) by mouth 2 (two) times daily as needed for constipation.  10 capsule  0  . guaiFENesin-codeine (ROBITUSSIN AC) 100-10 MG/5ML syrup Take 5 mLs by mouth 3 (three) times daily as needed for cough.  120 mL  0  . hydrochlorothiazide (MICROZIDE) 12.5 MG capsule Take 25 mg by mouth every morning.      Marland Kitchen losartan (COZAAR) 100 MG tablet Take 1 tablet (100 mg total) by mouth daily.  90 tablet  3  . zolpidem (AMBIEN CR) 12.5 MG CR tablet Take 1 tablet (12.5 mg total) by mouth at bedtime as needed for sleep.  30 tablet  1     Allergies  Allergen Reactions  . Prednisone Swelling  . Sulfa Antibiotics     rash  . Bactrim Rash  . Flomax (Tamsulosin Hcl) Rash    Past Medical History  Diagnosis Date  . Hypertension   . Personal history of kidney stones 2010    cystoscopy done right ureter unable to pass scope, passed stone on own  . Chest pain at rest     Past Surgical History  Procedure Date  . Appendectomy   . Abdominal hysterectomy   . Cystoscopy     History  Smoking status  . Never Smoker   Smokeless tobacco  . Not on file    History  Alcohol Use No    Family History  Problem Relation Age of Onset  . Heart disease Father   . Hypertension Father   . Hypertension Sister   . Hyperlipidemia Sister   . Hypertension Brother   . Hypertension Sister   . Colon cancer Sister   . Hypertension Sister   . Hypertension Sister   . Hypertension Sister   . Hyperlipidemia Sister   . Hypertension Brother   . Hypertension Brother   . Hyperlipidemia  Brother     Reviw of Systems:  Reviewed in the HPI.  All other systems are negative.  Physical Exam: BP 132/92  Pulse 63  Ht 5\' 5"  (1.651 m)  Wt 161 lb 6.4 oz (73.211 kg)  BMI 26.86 kg/m2 The patient is alert and oriented x 3.  The mood and affect are normal.   Skin: warm and dry.  Color is normal.    HEENT:   Normocephalic/atraumatic. Her carotids are normal. There is no JVD.  Lungs: Lungs are clear.   Heart: Regular S1-S2.    Abdomen: Good bowel sounds. No hepatosplenomegaly.  Extremities:  No clubbing cyanosis or edema.  Neuro:  There exam is nonfocal. Her gait is normal. Cranial nerves are intact.    ECG:  Assessment / Plan:

## 2012-05-13 ENCOUNTER — Telehealth: Payer: Self-pay | Admitting: *Deleted

## 2012-05-13 MED ORDER — POTASSIUM CHLORIDE CRYS ER 10 MEQ PO TBCR: 10.0000 meq | EXTENDED_RELEASE_TABLET | Freq: Every day | ORAL | Status: DC

## 2012-05-13 NOTE — Telephone Encounter (Signed)
Message copied by Antony Odea on Fri May 13, 2012  4:30 PM ------      Message from: Vesta Mixer      Created: Thu May 12, 2012  5:19 PM       She has been having some milligrams. Add potassium chloride 10 mEq a day. Recheck her labs in one month.

## 2012-05-13 NOTE — Telephone Encounter (Signed)
Left msg on personal cell, labs described and new med explained, told to call with questions or concerns, number provided.

## 2012-06-13 ENCOUNTER — Other Ambulatory Visit: Payer: Self-pay | Admitting: *Deleted

## 2012-06-13 DIAGNOSIS — I10 Essential (primary) hypertension: Secondary | ICD-10-CM

## 2012-06-13 MED ORDER — LOSARTAN POTASSIUM 100 MG PO TABS: 100.0000 mg | ORAL_TABLET | Freq: Every day | ORAL | Status: DC

## 2012-06-13 NOTE — Telephone Encounter (Signed)
Fax Received. Refill Completed.  [place] (R.M.A)   

## 2012-06-18 ENCOUNTER — Other Ambulatory Visit: Payer: Self-pay

## 2012-07-12 ENCOUNTER — Other Ambulatory Visit: Payer: Self-pay | Admitting: Gastroenterology

## 2012-07-15 ENCOUNTER — Other Ambulatory Visit: Payer: Self-pay | Admitting: Gastroenterology

## 2012-07-25 ENCOUNTER — Other Ambulatory Visit: Payer: Self-pay | Admitting: Gastroenterology

## 2012-07-25 NOTE — Addendum Note (Signed)
Addended by: ,  on: 07/25/2012 04:15 PM   Modules accepted: Orders  

## 2012-07-29 NOTE — Addendum Note (Signed)
Addended byVida Rigger on: 07/29/2012 02:59 PM   Modules accepted: Orders

## 2012-08-01 ENCOUNTER — Encounter (HOSPITAL_COMMUNITY): Payer: Self-pay

## 2012-08-01 ENCOUNTER — Encounter (HOSPITAL_COMMUNITY)
Admission: RE | Disposition: A | Discharge status: Hospice | Payer: Self-pay | Source: Ambulatory Visit | Attending: Gastroenterology

## 2012-08-01 ENCOUNTER — Ambulatory Visit (HOSPITAL_COMMUNITY)
Admission: RE | Admit: 2012-08-01 | Discharge: 2012-08-01 | Disposition: A | Discharge status: Hospice | Payer: 59 | Source: Ambulatory Visit | Attending: Gastroenterology | Admitting: Gastroenterology

## 2012-08-01 DIAGNOSIS — Z8371 Family history of colonic polyps: Secondary | ICD-10-CM | POA: Insufficient documentation

## 2012-08-01 DIAGNOSIS — Z800 Family history of malignant neoplasm of digestive organs: Secondary | ICD-10-CM | POA: Insufficient documentation

## 2012-08-01 DIAGNOSIS — Z1211 Encounter for screening for malignant neoplasm of colon: Secondary | ICD-10-CM | POA: Insufficient documentation

## 2012-08-01 DIAGNOSIS — K5730 Diverticulosis of large intestine without perforation or abscess without bleeding: Secondary | ICD-10-CM | POA: Insufficient documentation

## 2012-08-01 DIAGNOSIS — Z83719 Family history of colon polyps, unspecified: Secondary | ICD-10-CM | POA: Insufficient documentation

## 2012-08-01 DIAGNOSIS — K644 Residual hemorrhoidal skin tags: Secondary | ICD-10-CM | POA: Insufficient documentation

## 2012-08-01 DIAGNOSIS — K648 Other hemorrhoids: Secondary | ICD-10-CM | POA: Insufficient documentation

## 2012-08-01 HISTORY — PX: COLONOSCOPY: SHX5424

## 2012-08-01 SURGERY — COLONOSCOPY
Anesthesia: Moderate Sedation

## 2012-08-01 MED ORDER — FENTANYL CITRATE 0.05 MG/ML IJ SOLN
INTRAMUSCULAR | Status: DC | PRN
  Administered 2012-08-01 (×3): 25 ug via INTRAVENOUS

## 2012-08-01 MED ORDER — MIDAZOLAM HCL 10 MG/2ML IJ SOLN
INTRAMUSCULAR | Status: AC
  Filled 2012-08-01: qty 2

## 2012-08-01 MED ORDER — MIDAZOLAM HCL 5 MG/5ML IJ SOLN
INTRAMUSCULAR | Status: DC | PRN
  Administered 2012-08-01 (×3): 2 mg via INTRAVENOUS

## 2012-08-01 MED ORDER — FENTANYL CITRATE 0.05 MG/ML IJ SOLN
INTRAMUSCULAR | Status: AC
  Filled 2012-08-01: qty 2

## 2012-08-01 MED ORDER — SODIUM CHLORIDE 0.9 % IV SOLN: INTRAVENOUS | Status: DC

## 2012-08-01 NOTE — Discharge Instructions (Signed)
 Call if question or problem otherwise repeat colonoscopy in 5 year

## 2012-08-01 NOTE — Op Note (Signed)
[facility] [address] [place], [address]   COLONOSCOPY PROCEDURE REPORT  PATIENT: Caitlin Walker, Caitlin Walker  MR#: [id number] BIRTHDATE: 24-May-1945 , 66  yrs. old GENDER: Female ENDOSCOPIST: Vida Rigger, MD REFERRED XB:JYNW Ross, M.D. PROCEDURE DATE:  08/01/2012 PROCEDURE:   Colonoscopy, surveillance ASA CLASS:   Class I INDICATIONS:Patient's immediate family history of colon cancer and Patient's family history of colon polyps. MEDICATIONS: Fentanyl 75 mcg IV and Versed 6 mg IV  DESCRIPTION OF PROCEDURE:   After the risks benefits and alternatives of the procedure were thoroughly explained, informed consent was obtained.  A digital rectal exam revealed internal hemorrhoids and A digital rectal exam revealed external hemorrhoids.   The Pentax Ped Colon [id number]  endoscope was introduced through the anus and advanced to the cecum, which was identified by both the appendix and ileocecal valve. No adverse events experienced. to advanced to the cecum did require some abdominal pressure but no position changes. The quality of the prep was adequate.  The instrument was then slowly withdrawn as the colon was fully examined.other than a few sigmoid diverticuli only no other abnormalities were seen except for hemorrhoids on straight and retroflexed visualization as well as on anal rectal pull-through. The scope was then straightened and advanced a short ways of the left side of the colon air was suctioned scope removed patient tolerated the procedure well there was no obvious immediate problem      COLON FINDINGS: Internal and external hemorrhoids were found. Retroflexed views revealed internal hemorrhoids. The time to cecum= .  Withdrawal time=  .  The scope was withdrawn and the procedure completed. COMPLICATIONS: There were no complications.  ENDOSCOPIC IMPRESSION:.1 Internal/external hemorrhoids 2. Few left-sidedsigmoid diverticula.  3. Otherwise within normal  limits to the cecum RECOMMENDATIONS: GI followup when necessary repeat colon screening in 5 year   eSigned:  Vida Rigger, MD 08/01/2012 9:30 AM   cc:

## 2012-08-02 ENCOUNTER — Encounter (HOSPITAL_COMMUNITY): Payer: Self-pay | Admitting: Gastroenterology

## 2012-08-12 ENCOUNTER — Other Ambulatory Visit: Payer: Self-pay | Admitting: *Deleted

## 2012-08-12 MED ORDER — HYDROCHLOROTHIAZIDE 12.5 MG PO CAPS: 25.0000 mg | ORAL_CAPSULE | ORAL | Status: DC

## 2012-08-12 NOTE — Telephone Encounter (Signed)
Fax Received. Refill Completed.  [place] (R.M.A)   

## 2012-08-23 ENCOUNTER — Other Ambulatory Visit: Payer: Self-pay

## 2012-08-23 DIAGNOSIS — Z1231 Encounter for screening mammogram for malignant neoplasm of breast: Secondary | ICD-10-CM

## 2012-09-23 ENCOUNTER — Ambulatory Visit
Admission: RE | Admit: 2012-09-23 | Discharge: 2012-09-23 | Disposition: A | Discharge status: Home / Self Care | Payer: 59 | Source: Ambulatory Visit

## 2012-09-23 DIAGNOSIS — Z1231 Encounter for screening mammogram for malignant neoplasm of breast: Secondary | ICD-10-CM

## 2012-09-27 ENCOUNTER — Other Ambulatory Visit: Payer: Self-pay | Admitting: Family Medicine

## 2012-09-27 DIAGNOSIS — R928 Other abnormal and inconclusive findings on diagnostic imaging of breast: Secondary | ICD-10-CM

## 2012-09-28 ENCOUNTER — Telehealth: Payer: Self-pay | Admitting: Hematology & Oncology

## 2012-09-28 NOTE — Telephone Encounter (Signed)
S/W PT IN RE TO NP APPT 07/10 @ 10:30 W/DR. NAZIR REFERRING DR. Valla Leaver ROSS DX- LOW WBC  WELCOME PACKET MAILED.

## 2012-09-28 NOTE — Telephone Encounter (Signed)
C/D 09/28/12 for appt. 11/10/12

## 2012-09-28 NOTE — Telephone Encounter (Signed)
LVOM FOR PT TO RETURN CALL IN RE NP APPT.  °

## 2012-10-03 ENCOUNTER — Ambulatory Visit
Admission: RE | Admit: 2012-10-03 | Discharge: 2012-10-03 | Disposition: A | Discharge status: Home / Self Care | Payer: 59 | Source: Ambulatory Visit | Attending: Family Medicine | Admitting: Family Medicine

## 2012-10-03 DIAGNOSIS — R928 Other abnormal and inconclusive findings on diagnostic imaging of breast: Secondary | ICD-10-CM

## 2012-11-02 ENCOUNTER — Telehealth: Payer: Self-pay | Admitting: Oncology

## 2012-11-02 ENCOUNTER — Encounter: Payer: Self-pay | Admitting: Oncology

## 2012-11-02 ENCOUNTER — Other Ambulatory Visit (HOSPITAL_BASED_OUTPATIENT_CLINIC_OR_DEPARTMENT_OTHER): Payer: 59 | Admitting: Lab

## 2012-11-02 ENCOUNTER — Ambulatory Visit (HOSPITAL_BASED_OUTPATIENT_CLINIC_OR_DEPARTMENT_OTHER): Payer: 59

## 2012-11-02 ENCOUNTER — Ambulatory Visit (HOSPITAL_BASED_OUTPATIENT_CLINIC_OR_DEPARTMENT_OTHER): Payer: 59 | Admitting: Oncology

## 2012-11-02 VITALS — BP 129/87 | HR 58 | Temp 97.0°F | Resp 18 | Ht 64.0 in | Wt 157.3 lb

## 2012-11-02 DIAGNOSIS — D72819 Decreased white blood cell count, unspecified: Secondary | ICD-10-CM | POA: Insufficient documentation

## 2012-11-02 LAB — CBC & DIFF AND RETIC
BASO%: 1.3 % (ref 0.0–2.0)
Basophils Absolute: 0 10*3/uL (ref 0.0–0.1)
EOS%: 4.2 % (ref 0.0–7.0)
Eosinophils Absolute: 0.1 10*3/uL (ref 0.0–0.5)
HCT: 36.4 % (ref 34.8–46.6)
HGB: 12.1 g/dL (ref 11.6–15.9)
Immature Retic Fract: 2 % (ref 1.60–10.00)
LYMPH%: 48.1 % (ref 14.0–49.7)
MCH: 28.6 pg (ref 25.1–34.0)
MCHC: 33.2 g/dL (ref 31.5–36.0)
MCV: 86.1 fL (ref 79.5–101.0)
MONO#: 0.2 10*3/uL (ref 0.1–0.9)
MONO%: 7.7 % (ref 0.0–14.0)
NEUT#: 1.2 10*3/uL — ABNORMAL LOW (ref 1.5–6.5)
NEUT%: 38.7 % (ref 38.4–76.8)
Platelets: 237 10*3/uL (ref 145–400)
RBC: 4.23 10*6/uL (ref 3.70–5.45)
RDW: 14.3 % (ref 11.2–14.5)
Retic %: 0.93 % (ref 0.70–2.10)
Retic Ct Abs: 39.34 10*3/uL (ref 33.70–90.70)
WBC: 3.1 10*3/uL — ABNORMAL LOW (ref 3.9–10.3)
lymph#: 1.5 10*3/uL (ref 0.9–3.3)

## 2012-11-02 LAB — MORPHOLOGY
PLT EST: ADEQUATE
RBC Comments: NORMAL

## 2012-11-02 LAB — CHCC SMEAR

## 2012-11-02 NOTE — Progress Notes (Signed)
Checked in new patient. email for Caitlin Walker is her communication preference. She has no POA/living will.

## 2012-11-02 NOTE — Telephone Encounter (Signed)
Patient will see Dr. Cyndie Chime today appt was change.

## 2012-11-02 NOTE — Progress Notes (Signed)
New Patient Hematology-Oncology Evaluation   Caitlin Walker [id number] June 18, 1945 67 y.o. 11/02/2012  CC: Dr. 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 She's been in overall excellent health without any major medical or surgical illness. She was told when she was in her 10s that she had decreased granulocytes. She was also anemic. Blood counts available from 04/01/2010 show a moderate leukopenia with total white count 3200, 45% neutrophils, 46% lymphocytes, 2 eosinophils, hemoglobin 12.3, hematocrit 36.3, MCV 88, and platelet count 214,000. More recent labs done 09/20/2012 with total white count 2800, 38% neutrophils, 49% lymphocytes, hemoglobin 11.9, hematocrit 36.2, MCV 86, platelets 267,000. CBC in our office today with total white count 3100, 39% neutrophils, 48% lymphocytes, 8 monocytes, 4 eosinophils, hemoglobin 12.1, hematocrit 36.4, platelets 237,000. She gives no history of hepatitis, yellow jaundice, mononucleosis, malaria. She rarely gets an infection. The only event she remembers was a bronchitis which occurred coincidentally with the new flu vaccine that was administered this year at the hospital. She has no signs or symptoms of a collagen vascular disorder. She gets occasional leg cramps but no polymyalgia, polyarthralgia, hair loss, skin sensitivity. No rashes. She has no history of exposure to any toxic chemicals, high-dose insecticides, or radiation. She is a never smoker, she does not use alcohol. There is no family history of any blood disorder.   PMH: Past Medical History  Diagnosis Date  . Hypertension   . Personal history of kidney stones 2010    cystoscopy done right ureter unable to pass scope, passed stone on own  . Chest pain at rest   . Leukopenia 11/02/2012    WBC 2,800 38 poly, 49 lymph 09/20/12;  3,200  45 poly, 46 lymphs 04/01/10  No history of MI, asthma, ulcers, diabetes, thyroid disease, seizure, blood clots.  Past Surgical History  Procedure Laterality Date  . Appendectomy    . Abdominal hysterectomy    . Cystoscopy    . Colonoscopy N/A 08/01/2012    Procedure: COLONOSCOPY;  Surgeon: Petra Kuba, MD;  Location: [facility];  Service: Endoscopy;  Laterality: N/A;  pt. would like dan to do procedure    Allergies: Allergies  Allergen Reactions  . Prednisone Swelling  . Sulfa Antibiotics     rash  . Bactrim Rash  . Flomax (Tamsulosin Hcl) Rash    Medications: Aspirin 81 mg daily, calcium supplement, HCTZ 12.5 mg 2 tablets every morning, Cozaar 100 mg daily, magnesium 400 mg daily, potassium 10 mEq daily, prenatal vitamins with iron one daily, Ambien CR 12.5 mg at bedtime when necessary sleep.   Social History: See history of present illness. She is an Charity fundraiser. She has 3 children son 81, 2 daughters aged 34 and 24 all healthy.  reports that she has never smoked. She does not have any smokeless tobacco history on file. She reports that she does not drink alcohol or use illicit drugs.  Family History: Family History  Problem Relation Age of Onset  . Heart disease Father   . Hypertension Father   . Hypertension Sister   . Hyperlipidemia Sister   . Hypertension Brother   . Hypertension Sister   . Colon cancer Sister   . Hypertension Sister   . Hypertension Sister   . Hypertension Sister   . Hyperlipidemia Sister   . Hypertension Brother   . Hypertension Brother   .  Hyperlipidemia Brother   She lost a brother at age 32 metastatic lung cancer who was a heavy smoker. 2 brother still living with prostate cancer. She lost a sister with metastatic colon cancer at age 7.  Review of Systems: Constitutional symptoms: No constitutional symptoms HEENT: No recurrent pharyngitis or bronchitis Respiratory: No cough or dyspnea Cardiovascular:  No chest pain or  palpitations Gastrointestinal ROS: No abdominal pain, no change in bowel habit, recent colonoscopy March 2014 normal. Genito-Urinary ROS: No urinary tract symptoms. She is post hysterectomy. No vaginal bleeding. Hematological and Lymphatic: Musculoskeletal: See above Neurologic: No headaches or change in vision Dermatologic: See above Remaining ROS negative.  Physical Exam: Blood pressure 129/87, pulse 58, temperature 97 F (36.1 C), temperature source Oral, resp. rate 18, height 5\' 4"  (1.626 m), weight 157 lb 4.8 oz (71.351 kg). Wt Readings from Last 3 Encounters:  11/02/12 157 lb 4.8 oz (71.351 kg)  08/01/12 158 lb (71.668 kg)  08/01/12 158 lb (71.668 kg)    General appearance: Healthy appearing African American woman looking younger than her stated age HENNT: Pharynx no erythema exudate or mass. Lymph nodes: No cervical, supraclavicular, or axillary adenopathy Breasts: Lungs: Clear to auscultation resonant to percussion Heart: Regular rhythm no murmur or gallop Vascular: Carotids 1+ no bruits, no cyanosis Abdominal: Soft, nontender, no mass, no organomegaly GU: Extremities: No edema, no calf tenderness Neurologic: PERRLA, optic disc sharp on left not well visualized on the right, motor strength 5 over 5, reflexes 2+ symmetric, mild to moderate decrease in vibration sensation over the fingertips by tuning fork exam Skin: No rash or ecchymosis    Lab Results: Lab Results  Component Value Date   WBC 3.1* 11/02/2012   HGB 12.1 11/02/2012   HCT 36.4 11/02/2012   MCV 86.1 11/02/2012   PLT 237 11/02/2012     Chemistry      Component Value Date/Time   NA 138 05/12/2012 1244   K 3.8 05/12/2012 1244   CL 101 05/12/2012 1244   CO2 31 05/12/2012 1244   BUN 9 05/12/2012 1244   CREATININE 0.6 05/12/2012 1244      Component Value Date/Time   CALCIUM 9.3 05/12/2012 1244       Review of peripheral blood film: Normochromic normocytic red cells. No inclusions. No spherocytes. No schistocytes. No  polychromasia. Mature neutrophils. Normal platelets number and morphology. Majority of lymphocytes a benign, large, granular lymphocytes typical of that seen with acute or chronic viral infection..    Impression and Plan:  Chronic, mild, leukopenia, with shift towards lymphocytes. Review of the peripheral blood film suggests the etiology of her leukopenia is a footprint from a previous viral infection with an increased population of benign reactive, lymphocytes. It is also possible that this is just an ethnic variation with up to 10% of African Americans being leukopenic. There is no suspicion for an underlying significant bone marrow disorder at this time.  I am going to screen her for EBV and CMV viruses, check ANA, ESR, and a serum protein and immunoelectrophoresis. I will call her when these results are available.  Patient is reassured.      Levert Feinstein, MD 11/02/2012, 12:57 PM

## 2012-11-07 LAB — EPSTEIN-BARR VIRUS VCA, IGM: EBV VCA IgM: <10 U/mL

## 2012-11-07 LAB — IMMUNOFIXATION ELECTROPHORESIS
IgA: 168 mg/dL (ref 69–380)
IgG (Immunoglobin G), Serum: 1000 mg/dL (ref 690–1700)
IgM, Serum: 97 mg/dL (ref 52–322)
Total Protein, Serum Electrophoresis: 6.7 g/dL (ref 6.0–8.3)

## 2012-11-07 LAB — CYTOMEGALOVIRUS ANTIBODY, IGG: Cytomegalovirus Ab-IgG: >10 U/mL — ABNORMAL HIGH

## 2012-11-07 LAB — CMV IGM: CMV IgM: <8 [AU]/ml

## 2012-11-07 LAB — ANA: Anti Nuclear Antibody(ANA): NEGATIVE

## 2012-11-07 LAB — EPSTEIN-BARR VIRUS VCA, IGG: EBV VCA IgG: >750 U/mL — ABNORMAL HIGH

## 2012-11-07 LAB — SEDIMENTATION RATE: Sed Rate: 8 mm/h (ref 0–22)

## 2012-11-10 ENCOUNTER — Ambulatory Visit: Payer: 59 | Admitting: Hematology & Oncology

## 2012-11-10 ENCOUNTER — Ambulatory Visit: Payer: 59

## 2012-11-10 ENCOUNTER — Other Ambulatory Visit: Payer: 59 | Admitting: Lab

## 2012-12-13 ENCOUNTER — Telehealth: Payer: Self-pay | Admitting: *Deleted

## 2012-12-13 NOTE — Telephone Encounter (Signed)
Message copied by Sabino Snipes on Tue Dec 13, 2012 10:58 AM ------      Message from: Levert Feinstein      Created: Thu Dec 08, 2012  2:31 PM       Call patient:  Labs last month: normal antibody profile, normal ESR, negative ANA lupus screen.  Evidence of previous exposure to but not active infection with 2 common viruses: EBV (mono virus) & CMV.  Can be related to chronic low white count but hard to prove.  Nothing else needs  to be done ------

## 2012-12-13 NOTE — Telephone Encounter (Signed)
Received return call from pt after being asked to return call 12/12/12.  Message per Dr Cyndie Chime given to pt & she expressed apprectiation for call.

## 2013-01-13 ENCOUNTER — Telehealth: Payer: Self-pay | Admitting: Cardiovascular Disease

## 2013-01-13 NOTE — Telephone Encounter (Signed)
New Problem  Pt believes she is due for a 6 mo f/up wanted to confirm w/ a nurse before she reschedules.

## 2013-01-13 NOTE — Telephone Encounter (Signed)
App made 

## 2013-02-01 ENCOUNTER — Encounter: Payer: Self-pay | Admitting: Cardiovascular Disease

## 2013-02-01 ENCOUNTER — Ambulatory Visit (INDEPENDENT_AMBULATORY_CARE_PROVIDER_SITE_OTHER): Payer: 59 | Admitting: Cardiovascular Disease

## 2013-02-01 VITALS — BP 149/90 | HR 57 | Ht 64.5 in | Wt 157.8 lb

## 2013-02-01 DIAGNOSIS — I10 Essential (primary) hypertension: Secondary | ICD-10-CM

## 2013-02-01 NOTE — Patient Instructions (Addendum)
Decrease your HCTZ (microzide) to 12.5 mg daily. Continue taking your potassium.  Your physician wants you to follow-up in: 6 months  You will receive a reminder letter in the mail two months in advance.  If you don't receive a letter, please call our office to schedule the follow-up appointment.  We will draw blood work for a Basic Metabolic Panel at that time.

## 2013-02-01 NOTE — Progress Notes (Signed)
Caitlin Walker Date of Birth  [birth year] [facility]     [place] Office  [address]    [address]   [address] [place], [place]  [address]    [place], [place]  [address] [phone]  Fax  [phone]  [phone]  Fax [phone]   Problem list: 1. Hypertension 2. Insomnia  History of Present Illness:  Caitlin Walker is a 67 year old female with a history of hypertension. She has done very well. She's not having episodes of chest pain or shortness of breath. She avoids eating any extra salt.  She exercises several times a week.  She is having trouble with insomnia.  May 12, 2012: She has been doing well from a cardiac standpoint.  She has had some lightheadness.  She also has had some leg cramps.  She has not been exercising.  She has been watching her salt intake.   She still works full time in the OR.  Oct. 1, 2014:  Caitlin Walker is doing ok.  Still has leg cramps.  She has been busy - her was diagnosed with breast cancer.  She is running around lots .  No CP or dyspnea.  Has occasional indigestion.   She works in the [employer] OR.      Current Outpatient Prescriptions on File Prior to Visit  Medication Sig Dispense Refill  . aspirin 81 MG tablet Take 81 mg by mouth daily.      . calcium carbonate (OS-CAL) 600 MG TABS Take 600 mg by mouth daily with breakfast.       . hydrochlorothiazide (MICROZIDE) 12.5 MG capsule Take 2 capsules (25 mg total) by mouth every morning.  180 capsule  3  . losartan (COZAAR) 100 MG tablet Take 1 tablet (100 mg total) by mouth daily.  90 tablet  3  . magnesium oxide (MAG-OX) 400 MG tablet Take 400 mg by mouth daily.      . potassium chloride (K-DUR,KLOR-CON) 10 MEQ tablet Take 1 tablet (10 mEq total) by mouth daily.  30 tablet  11  . Prenatal Vit-Fe Fumarate-FA (MULTIVITAMIN-PRENATAL) 27-0.8 MG TABS Take 1 tablet by mouth daily at 12 noon.      Marland Kitchen zolpidem (AMBIEN CR) 12.5 MG CR tablet Take 1 tablet (12.5 mg total) by mouth at  bedtime as needed for sleep.  30 tablet  1   No current facility-administered medications on file prior to visit.    Allergies  Allergen Reactions  . Prednisone Swelling  . Sulfa Antibiotics     rash  . Bactrim Rash  . Flomax [Tamsulosin Hcl] Rash    Past Medical History  Diagnosis Date  . Hypertension   . Personal history of kidney stones 2010    cystoscopy done right ureter unable to pass scope, passed stone on own  . Chest pain at rest   . Leukopenia 11/02/2012    WBC 2,800 38 poly, 49 lymph 09/20/12;  3,200 45 poly, 46 lymphs 04/01/10    Past Surgical History  Procedure Laterality Date  . Appendectomy    . Abdominal hysterectomy    . Cystoscopy    . Colonoscopy N/A 08/01/2012    Procedure: COLONOSCOPY;  Surgeon: Petra Kuba, MD;  Location: [facility];  Service: Endoscopy;  Laterality: N/A;  pt. would like dan to do procedure    History  Smoking status  . Never Smoker   Smokeless tobacco  . Not on file    History  Alcohol Use No  Family History  Problem Relation Age of Onset  . Heart disease Father   . Hypertension Father   . Hypertension Sister   . Hyperlipidemia Sister   . Hypertension Brother   . Hypertension Sister   . Colon cancer Sister   . Hypertension Sister   . Hypertension Sister   . Hypertension Sister   . Hyperlipidemia Sister   . Hypertension Brother   . Hypertension Brother   . Hyperlipidemia Brother     Reviw of Systems:  Reviewed in the HPI.  All other systems are negative.  Physical Exam: BP 149/90  Pulse 57  Ht 5' 4.5" (1.638 m)  Wt 157 lb 12 oz (71.555 kg)  BMI 26.67 kg/m2 The patient is alert and oriented x 3.  The mood and affect are normal.   Skin: warm and dry.  Color is normal.    HEENT:   Normocephalic/atraumatic. Her carotids are normal. There is no JVD.  Lungs: Lungs are clear.   Heart: Regular S1-S2.    Abdomen: Good bowel sounds. No hepatosplenomegaly.  Extremities:  No clubbing cyanosis or  edema.  Neuro:  There exam is nonfocal. Her gait is normal. Cranial nerves are intact.    ECG: Oct. 1, 2014:  Sinus brady at 55. Normal ECG Assessment / Plan:

## 2013-02-01 NOTE — Assessment & Plan Note (Signed)
Caitlin Walker's blood pressure is still mildly elevated today. She attributed most of this to her lack of sleep and to the fact that she's been running rounds much taking care of her family.  She continues to have significant leg cramps and affect had a very bad cramp last night. I suspect that she'll do better with a slightly lower dose of HCTZ. We will decrease her HCTZ to 12.5 mg a day in hopes that this will improve her leg cramps. I also hope that it will not cause an increase in her blood pressure.  She has already been tried on amlodipine which caused leg swelling, Bystolic  which caused atypical chest pain.  We could consider changing her losartan to myocarditis or perhaps adding hydralazine Cardura. I'll see her again in 6 months for followup visit.

## 2013-02-27 ENCOUNTER — Ambulatory Visit (INDEPENDENT_AMBULATORY_CARE_PROVIDER_SITE_OTHER): Payer: 59 | Admitting: Internal Medicine

## 2013-02-27 ENCOUNTER — Telehealth: Payer: Self-pay | Admitting: *Deleted

## 2013-02-27 ENCOUNTER — Encounter: Payer: Self-pay | Admitting: Internal Medicine

## 2013-02-27 VITALS — BP 142/98 | HR 67 | Ht 64.5 in | Wt 158.0 lb

## 2013-02-27 DIAGNOSIS — R079 Chest pain, unspecified: Secondary | ICD-10-CM

## 2013-02-27 LAB — BASIC METABOLIC PANEL WITH GFR
BUN: 13 mg/dL (ref 6–23)
CO2: 31 meq/L (ref 19–32)
Calcium: 9.5 mg/dL (ref 8.4–10.5)
Chloride: 97 meq/L (ref 96–112)
Creatinine, Ser: 0.7 mg/dL (ref 0.4–1.2)
GFR: 107.91 mL/min
Glucose, Bld: 94 mg/dL (ref 70–99)
Potassium: 3.6 meq/L (ref 3.5–5.1)
Sodium: 138 meq/L (ref 135–145)

## 2013-02-27 LAB — MAGNESIUM: Magnesium: 1.7 mg/dL (ref 1.5–2.5)

## 2013-02-27 MED ORDER — METOPROLOL SUCCINATE ER 25 MG PO TB24: 25.0000 mg | ORAL_TABLET | Freq: Every day | ORAL | Status: DC

## 2013-02-27 NOTE — Progress Notes (Signed)
HPI Patient is a 67 yo who is usually followed by P Nahser  She was seen in clinic earlier this month She presents as add on today.  Was not feeling good.  Lightheaded.  Took BP and it was up.    She denies CP  Breathing is OK  No presyncope.   Allergies  Allergen Reactions  . Prednisone Swelling  . Sulfa Antibiotics     rash  . Bactrim Rash  . Flomax [Tamsulosin Hcl] Rash    Current Outpatient Prescriptions  Medication Sig Dispense Refill  . aspirin 81 MG tablet Take 81 mg by mouth daily.      . calcium carbonate (OS-CAL) 600 MG TABS Take 600 mg by mouth daily with breakfast.       . hydrochlorothiazide (MICROZIDE) 12.5 MG capsule Take 2 capsules (25 mg total) by mouth every morning.  180 capsule  3  . losartan (COZAAR) 100 MG tablet Take 1 tablet (100 mg total) by mouth daily.  90 tablet  3  . Magnesium 400 MG CAPS Take by mouth daily.      . magnesium oxide (MAG-OX) 400 MG tablet Take 400 mg by mouth daily.      . potassium chloride (K-DUR,KLOR-CON) 10 MEQ tablet Take 1 tablet (10 mEq total) by mouth daily.  30 tablet  11  . Prenatal Vit-Fe Fumarate-FA (MULTIVITAMIN-PRENATAL) 27-0.8 MG TABS Take 1 tablet by mouth daily at 12 noon.      Marland Kitchen zolpidem (AMBIEN CR) 12.5 MG CR tablet Take 1 tablet (12.5 mg total) by mouth at bedtime as needed for sleep.  30 tablet  1  . metoprolol succinate (TOPROL XL) 25 MG 24 hr tablet Take 1 tablet (25 mg total) by mouth daily.  30 tablet  12   No current facility-administered medications for this visit.    Past Medical History  Diagnosis Date  . Hypertension   . Personal history of kidney stones 2010    cystoscopy done right ureter unable to pass scope, passed stone on own  . Chest pain at rest   . Leukopenia 11/02/2012    WBC 2,800 38 poly, 49 lymph 09/20/12;  3,200 45 poly, 46 lymphs 04/01/10    Past Surgical History  Procedure Laterality Date  . Appendectomy    . Abdominal hysterectomy    . Cystoscopy    . Colonoscopy N/A 08/01/2012     Procedure: COLONOSCOPY;  Surgeon: Petra Kuba, MD;  Location: [facility];  Service: Endoscopy;  Laterality: N/A;  pt. would like dan to do procedure    Family History  Problem Relation Age of Onset  . Heart disease Father   . Hypertension Father   . Hypertension Sister   . Hyperlipidemia Sister   . Hypertension Brother   . Hypertension Sister   . Colon cancer Sister   . Hypertension Sister   . Hypertension Sister   . Hypertension Sister   . Hyperlipidemia Sister   . Hypertension Brother   . Hypertension Brother   . Hyperlipidemia Brother     History   Social History  . Marital Status: Married    Spouse Name: N/A    Number of Children: N/A  . Years of Education: N/A   Occupational History  . Not on file.   Social History Main Topics  . Smoking status: Never Smoker   . Smokeless tobacco: Not on file  . Alcohol Use: No  . Drug Use: No  . Sexual Activity:    Other  Topics Concern  . Not on file   Social History Narrative  . No narrative on file    Review of Systems:  All systems reviewed.  They are negative to the above problem except as previously stated.  Vital Signs: BP 142/98  Pulse 67  Ht 5' 4.5" (1.638 m)  Wt 158 lb (71.668 kg)  BMI 26.71 kg/m2  SpO2 96%  Physical Exam Patient is in NAD HEENT:  Normocephalic, atraumatic. EOMI, PERRLA.  Neck: JVP is normal.  No bruits.  Lungs: clear to auscultation. No rales no wheezes.  Heart: Regular rate and rhythm. Normal S1, S2. No S3.   No significant murmurs. PMI not displaced.  Abdomen:  Supple, nontender. Normal bowel sounds. No masses. No hepatomegaly.  Extremities:   Good distal pulses throughout. No lower extremity edema.  Musculoskeletal :moving all extremities.  Neuro:   alert and oriented x3.  CN II-XII grossly intact.  EKG  SR 61  Nonspecific ST T wave changes Assessment and Plan:  1.  HTN  Not controlled  I would start Toprol XL 25.  Patient to follow BP  Needs close f/u CHeck BMET and Mg.

## 2013-02-27 NOTE — Telephone Encounter (Signed)
Patient called complaining of not feeling well since yesterday. BP was 140/100 with a pulse of 100. Today her BP is 143/107 with a pulse of 116 and she feels very nervous. She is scheduled to go to work today in the [employer]. Advised her to see Dr.Ross this AM at 11AM. Patient agreed to above plan.

## 2013-02-27 NOTE — Patient Instructions (Addendum)
Your physician recommends that you schedule a follow-up appointment in: 2-3 Weeks with dr Elease Hashimoto   START METOPROLOL XL 25 MG ONCE DAILY

## 2013-02-27 NOTE — Telephone Encounter (Signed)
The patient called today complaining of an elevated BP yesterday and again this morning. BP 143/104 HR- 116. The patient is on HCTZ 12.5 mg once daily and losartan 100 mg once daily. She takes these medications at night. She denies SOB or flucuation of her heart rate with movement. She states she "just doesn't feel good." I advised her to contact her PCP to see if they can see her today as Dr. Mariah Milling is our only provider and he is full today. I advised she can also contact the [facility] office and see they Dr. Elease Hashimoto is available for her to see today, or the ER for evaluation. She verbalizes understanding.

## 2013-03-02 ENCOUNTER — Other Ambulatory Visit: Payer: Self-pay | Admitting: Family Medicine

## 2013-03-02 DIAGNOSIS — N6320 Unspecified lump in the left breast, unspecified quadrant: Secondary | ICD-10-CM

## 2013-03-09 ENCOUNTER — Other Ambulatory Visit: Payer: Self-pay

## 2013-03-15 ENCOUNTER — Encounter: Payer: Self-pay | Admitting: Cardiovascular Disease

## 2013-03-15 ENCOUNTER — Ambulatory Visit (INDEPENDENT_AMBULATORY_CARE_PROVIDER_SITE_OTHER): Payer: 59 | Admitting: Cardiovascular Disease

## 2013-03-15 VITALS — BP 140/98 | HR 64 | Ht 64.5 in | Wt 159.1 lb

## 2013-03-15 DIAGNOSIS — I10 Essential (primary) hypertension: Secondary | ICD-10-CM

## 2013-03-15 NOTE — Progress Notes (Signed)
Caitlin Walker Date of Birth  1945-05-08 [facility]      Office  [address]    [address]   [address] [place], [place]  [address]    [place], [place]  [address] [phone]  Fax  [phone]  [phone]  Fax [phone]   Problem list: 1. Hypertension 2. Insomnia  History of Present Illness:  Caitlin Walker is a 67 year old female with a history of hypertension. She has done very well. She's not having episodes of chest pain or shortness of breath. She avoids eating any extra salt.  She exercises several times a week.  She is having trouble with insomnia.  May 12, 2012: She has been doing well from a cardiac standpoint.  She has had some lightheadness.  She also has had some leg cramps.  She has not been exercising.  She has been watching her salt intake.   She still works full time in the OR.  Oct. 1, 2014:  Caitlin Walker is doing ok.  Still has leg cramps.  She has been busy - her was diagnosed with breast cancer.  She is running around lots .  No CP or dyspnea.  Has occasional indigestion.   She works in the [employer] OR.    Nov. 12, 2014   Current Outpatient Prescriptions on File Prior to Visit  Medication Sig Dispense Refill  . aspirin 81 MG tablet Take 81 mg by mouth daily.      . calcium carbonate (OS-CAL) 600 MG TABS Take 600 mg by mouth daily with breakfast.       . losartan (COZAAR) 100 MG tablet Take 1 tablet (100 mg total) by mouth daily.  90 tablet  3  . magnesium oxide (MAG-OX) 400 MG tablet Take 400 mg by mouth daily.      . metoprolol succinate (TOPROL XL) 25 MG 24 hr tablet Take 1 tablet (25 mg total) by mouth daily.  30 tablet  12  . potassium chloride (K-DUR,KLOR-CON) 10 MEQ tablet Take 1 tablet (10 mEq total) by mouth daily.  30 tablet  11  . Prenatal Vit-Fe Fumarate-FA (MULTIVITAMIN-PRENATAL) 27-0.8 MG TABS Take 1 tablet by mouth daily at 12 noon.      Marland Kitchen zolpidem (AMBIEN CR) 12.5 MG CR tablet Take 1 tablet (12.5 mg total) by mouth  at bedtime as needed for sleep.  30 tablet  1   No current facility-administered medications on file prior to visit.    Allergies  Allergen Reactions  . Prednisone Swelling  . Sulfa Antibiotics     rash  . Bactrim Rash  . Flomax [Tamsulosin Hcl] Rash    Past Medical History  Diagnosis Date  . Hypertension   . Personal history of kidney stones 2010    cystoscopy done right ureter unable to pass scope, passed stone on own  . Chest pain at rest   . Leukopenia 11/02/2012    WBC 2,800 38 poly, 49 lymph 09/20/12;  3,200 45 poly, 46 lymphs 04/01/10    Past Surgical History  Procedure Laterality Date  . Appendectomy    . Abdominal hysterectomy    . Cystoscopy    . Colonoscopy N/A 08/01/2012    Procedure: COLONOSCOPY;  Surgeon: Petra Kuba, MD;  Location: [facility];  Service: Endoscopy;  Laterality: N/A;  pt. would like dan to do procedure    History  Smoking status  . Never Smoker   Smokeless tobacco  . Not on file  History  Alcohol Use No    Family History  Problem Relation Age of Onset  . Heart disease Father   . Hypertension Father   . Hypertension Sister   . Hyperlipidemia Sister   . Hypertension Brother   . Hypertension Sister   . Colon cancer Sister   . Hypertension Sister   . Hypertension Sister   . Hypertension Sister   . Hyperlipidemia Sister   . Hypertension Brother   . Hypertension Brother   . Hyperlipidemia Brother     Reviw of Systems:  Reviewed in the HPI.  All other systems are negative.  Physical Exam: BP 140/98  Pulse 64  Ht 5' 4.5" (1.638 m)  Wt 159 lb 1.9 oz (72.176 kg)  BMI 26.90 kg/m2 The patient is alert and oriented x 3.  The mood and affect are normal.   Skin: warm and dry.  Color is normal.    HEENT:   Normocephalic/atraumatic. Her carotids are normal. There is no JVD.  Lungs: Lungs are clear.   Heart: Regular S1-S2.    Abdomen: Good bowel sounds. No hepatosplenomegaly.  Extremities:  No clubbing cyanosis or  edema.  Neuro:  There exam is nonfocal. Her gait is normal. Cranial nerves are intact.    ECG: Oct. 1, 2014:  Sinus brady at 67. Normal ECG Assessment / Plan:

## 2013-03-15 NOTE — Patient Instructions (Signed)
Your physician wants you to follow-up in: 6 MONTHS.  You will receive a reminder letter in the mail two months in advance. If you don't receive a letter, please call our office to schedule the follow-up appointment.  Your physician recommends that you continue on your current medications as directed. Please refer to the Current Medication list given to you today.  

## 2013-03-15 NOTE — Assessment & Plan Note (Signed)
Her BP is still elevated.  Her BP log has many normal readins and some that are a bit high.  i think her job working 3rd shift is contributing to some of this.    Will have her stay on same meds.  i will see in 6 months.

## 2013-03-17 ENCOUNTER — Telehealth: Payer: Self-pay | Admitting: Cardiovascular Disease

## 2013-03-17 NOTE — Telephone Encounter (Signed)
New Problem:  Pt states she is taking metoprolol 25 mg and her BP is 122/88  And her heart rate is 53. Pt states she is concerned about her heart rate being too low. Pt would like to be advised by the nurse.

## 2013-03-17 NOTE — Telephone Encounter (Signed)
Lm @ 1:40

## 2013-03-17 NOTE — Telephone Encounter (Signed)
States at last OV 11/12 Dr. Elease Hashimoto put her on Metoprolol Succinate 25 mg. This week HR has been in low to mid 50's. Today was 53.  She wants to know if should continue Metoprolol.  She takes it at night.  States she is tired but no other symptoms of lightheadedness or dizziness. She is on her way to work at [employer]. Will speak w/Dr. Tenny Craw (DOD) since Dr. Elease Hashimoto is off this PM.  Spoke w/Dr. Tenny Craw and she felt HR was OK and continue medication. Advised pt that I will send this to Dr. Elease Hashimoto to review on Monday.  She knows to stop dose if becomes symptomatic ie lightedheaded.

## 2013-03-20 NOTE — Telephone Encounter (Signed)
Continue metoprolol as scheduled for now- asymptomatic

## 2013-04-07 ENCOUNTER — Ambulatory Visit
Admission: RE | Admit: 2013-04-07 | Discharge: 2013-04-07 | Disposition: A | Discharge status: Home / Self Care | Payer: 59 | Source: Ambulatory Visit | Attending: Family Medicine | Admitting: Family Medicine

## 2013-04-07 DIAGNOSIS — N6320 Unspecified lump in the left breast, unspecified quadrant: Secondary | ICD-10-CM

## 2013-04-19 ENCOUNTER — Emergency Department (HOSPITAL_COMMUNITY)
Admission: EM | Admit: 2013-04-19 | Discharge: 2013-04-19 | Disposition: A | Discharge status: Home / Self Care | Payer: PRIVATE HEALTH INSURANCE | Attending: Emergency Medicine | Admitting: Emergency Medicine

## 2013-04-19 ENCOUNTER — Encounter (HOSPITAL_COMMUNITY): Payer: Self-pay | Admitting: Emergency Medicine

## 2013-04-19 ENCOUNTER — Emergency Department (HOSPITAL_COMMUNITY): Payer: PRIVATE HEALTH INSURANCE

## 2013-04-19 DIAGNOSIS — Y990 Civilian activity done for income or pay: Secondary | ICD-10-CM | POA: Insufficient documentation

## 2013-04-19 DIAGNOSIS — Z87442 Personal history of urinary calculi: Secondary | ICD-10-CM | POA: Insufficient documentation

## 2013-04-19 DIAGNOSIS — Z79899 Other long term (current) drug therapy: Secondary | ICD-10-CM | POA: Insufficient documentation

## 2013-04-19 DIAGNOSIS — Z7982 Long term (current) use of aspirin: Secondary | ICD-10-CM | POA: Insufficient documentation

## 2013-04-19 DIAGNOSIS — Z862 Personal history of diseases of the blood and blood-forming organs and certain disorders involving the immune mechanism: Secondary | ICD-10-CM | POA: Insufficient documentation

## 2013-04-19 DIAGNOSIS — S60221A Contusion of right hand, initial encounter: Secondary | ICD-10-CM

## 2013-04-19 DIAGNOSIS — Y9389 Activity, other specified: Secondary | ICD-10-CM | POA: Insufficient documentation

## 2013-04-19 DIAGNOSIS — Y9289 Other specified places as the place of occurrence of the external cause: Secondary | ICD-10-CM | POA: Insufficient documentation

## 2013-04-19 DIAGNOSIS — S60229A Contusion of unspecified hand, initial encounter: Secondary | ICD-10-CM | POA: Insufficient documentation

## 2013-04-19 DIAGNOSIS — I10 Essential (primary) hypertension: Secondary | ICD-10-CM | POA: Insufficient documentation

## 2013-04-19 DIAGNOSIS — W230XXA Caught, crushed, jammed, or pinched between moving objects, initial encounter: Secondary | ICD-10-CM | POA: Insufficient documentation

## 2013-04-19 MED ORDER — HYDROCODONE-ACETAMINOPHEN 5-325 MG PO TABS: 1.0000 | ORAL_TABLET | ORAL | Status: DC | PRN

## 2013-04-19 MED ORDER — OXYCODONE-ACETAMINOPHEN 5-325 MG PO TABS
2.0000 | ORAL_TABLET | Freq: Once | ORAL | Status: AC
  Administered 2013-04-19: 2 via ORAL
  Filled 2013-04-19: qty 2

## 2013-04-19 NOTE — ED Provider Notes (Signed)
CSN: [id number]     Arrival date & time 04/19/13  1643 History  This chart was scribed for Marlon Pel, PA working with Roney Marion, MD by Dorothey Baseman, ED Scribe. This patient was seen in room WTR7/WTR7 and the patient's care was started at 6:01 PM.   Chief Complaint  Patient presents with  . Hand Injury    The history is provided by the patient. No language interpreter was used.   HPI Comments: Caitlin Walker is a 67 y.o. female who presents to the Emergency Department complaining of a constant pain with associated swelling to the right hand onset earlier today after she reports that she got her hand caught between a surgical robot and a wall while she was at work. She reports that she was able to maneuver her hand from being stuck. Patient applied ice to the area PTA with mild, temporary relief. She denies numbness. Patient is right-handed. Patient also has a history of HTN.   Past Medical History  Diagnosis Date  . Hypertension   . Personal history of kidney stones 2010    cystoscopy done right ureter unable to pass scope, passed stone on own  . Chest pain at rest   . Leukopenia 11/02/2012    WBC 2,800 38 poly, 49 lymph 09/20/12;  3,200 45 poly, 46 lymphs 04/01/10    Past Surgical History  Procedure Laterality Date  . Appendectomy    . Abdominal hysterectomy    . Cystoscopy    . Colonoscopy N/A 08/01/2012    Procedure: COLONOSCOPY;  Surgeon: Petra Kuba, MD;  Location: [facility];  Service: Endoscopy;  Laterality: N/A;  pt. would like dan to do procedure    Family History  Problem Relation Age of Onset  . Heart disease Father   . Hypertension Father   . Hypertension Sister   . Hyperlipidemia Sister   . Hypertension Brother   . Hypertension Sister   . Colon cancer Sister   . Hypertension Sister   . Hypertension Sister   . Hypertension Sister   . Hyperlipidemia Sister   . Hypertension Brother   . Hypertension Brother   . Hyperlipidemia Brother     History   Substance Use Topics  . Smoking status: Never Smoker   . Smokeless tobacco: Not on file  . Alcohol Use: No    OB History   Grav Para Term Preterm Abortions TAB SAB Ect Mult Living                  Review of Systems  Musculoskeletal: Positive for arthralgias, joint swelling and myalgias.  Neurological: Negative for numbness.  All other systems reviewed and are negative.   Allergies  Prednisone; Sulfa antibiotics; Bactrim; and Flomax  Home Medications   Current Outpatient Rx  Name  Route  Sig  Dispense  Refill  . aspirin 81 MG tablet   Oral   Take 81 mg by mouth every evening.          . calcium carbonate (OS-CAL) 600 MG TABS   Oral   Take 600 mg by mouth daily with breakfast.          . hydrochlorothiazide (HYDRODIURIL) 25 MG tablet   Oral   Take 25 mg by mouth every morning.         Marland Kitchen losartan (COZAAR) 100 MG tablet   Oral   Take 1 tablet (100 mg total) by mouth daily.   90 tablet   3   .  magnesium oxide (MAG-OX) 400 MG tablet   Oral   Take 400 mg by mouth every evening.          . potassium chloride (K-DUR,KLOR-CON) 10 MEQ tablet   Oral   Take 1 tablet (10 mEq total) by mouth daily.   30 tablet   11   . Prenatal Vit-Fe Fumarate-FA (MULTIVITAMIN-PRENATAL) 27-0.8 MG TABS   Oral   Take 1 tablet by mouth every morning.          . zolpidem (AMBIEN CR) 12.5 MG CR tablet   Oral   Take 1 tablet (12.5 mg total) by mouth at bedtime as needed for sleep.   30 tablet   1   . HYDROcodone-acetaminophen (NORCO/VICODIN) 5-325 MG per tablet   Oral   Take 1-2 tablets by mouth every 4 (four) hours as needed.   20 tablet   0    BP 128/91  Pulse 65  Temp(Src) 98.2 F (36.8 C) (Oral)  Resp 17  SpO2 100%  Physical Exam  Nursing note and vitals reviewed. Constitutional: She is oriented to person, place, and time. She appears well-developed and well-nourished. No distress.  HENT:  Head: Normocephalic and atraumatic.  Eyes: EOM are normal.  Neck:  Normal range of motion. Neck supple. No tracheal deviation present.  Cardiovascular: Normal rate.   Pulmonary/Chest: Effort normal. No respiratory distress.  Abdominal: Soft. She exhibits no distension.  Musculoskeletal: Normal range of motion. She exhibits tenderness.  Tenderness and redness to her thumb and the ulnar side of her right hand. Intact sensation. Full range of motion. No obvious deformity.   Neurological: She is alert and oriented to person, place, and time.  Skin: Skin is warm and dry.  Psychiatric: She has a normal mood and affect. Her behavior is normal.    ED Course  Procedures (including critical care time)  DIAGNOSTIC STUDIES: Oxygen Saturation is 100% on room air, normal by my interpretation.    COORDINATION OF CARE: 6:02 PM- Discussed that x-ray results do not indicate any fractures. Ordered Percocet to manage symptoms. Will discharge patient with a thumb spica splint and Vicodin. Discussed treatment plan with patient at bedside and patient verbalized agreement.     Labs Review Labs Reviewed - No data to display   Imaging Review Dg Hand Complete Right  04/19/2013   CLINICAL DATA:  Traumatic injury and pain  EXAM: RIGHT HAND - COMPLETE 3+ VIEW  COMPARISON:  None.  FINDINGS: No acute fracture or dislocation is identified. Degenerative changes of the 1st interphalangeal joint are noted. No gross soft tissue abnormality is noted.  IMPRESSION: No acute abnormality noted.   Electronically Signed   By: Alcide Clever M.D.   On: 04/19/2013 17:34    EKG Interpretation   None       MDM   1. Hand contusion, right, initial encounter     67 y.o.[name]'s evaluation in the Emergency Department is complete. It has been determined that no acute conditions requiring further emergency intervention are present at this time. The patient/guardian have been advised of the diagnosis and plan. We have discussed signs and symptoms that warrant return to the ED, such  as changes or worsening in symptoms.  Vital signs are stable at discharge. Filed Vitals:   04/19/13 1707  BP: 128/91  Pulse: 65  Temp: 98.2 F (36.8 C)  Resp: 17    Patient/guardian has voiced understanding and agreed to follow-up with the PCP or specialist.  I personally performed  the services described in this documentation, which was scribed in my presence. The recorded information has been reviewed and is accurate.    Dorthula Matas, PA-C 04/19/13 1811

## 2013-04-19 NOTE — ED Notes (Signed)
Pt has a ride home.  

## 2013-04-19 NOTE — ED Notes (Signed)
Pt c/o R hand pain and swelling. 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 Pt has some swelling to R hand near thumb. Pt holding ice pack to R hand.

## 2013-04-19 NOTE — Discharge Instructions (Signed)
 Bone Bruise  A bone bruise is a small hidden fracture of the bone. It typically occurs with bones located close to the surface of the skin.  SYMPTOMS  The pain lasts longer than a normal bruise.  The bruised area is difficult to use.  There may be discoloration or swelling of the bruised area.  When a bone bruise is found with injury to the anterior cruciate ligament (in the knee) there is often an increased:  Amount of fluid in the knee  Time the fluid in the knee lasts.  Number of days until you are walking normally and regaining the motion you had before the injury.  Number of days with pain from the injury. DIAGNOSIS  It can only be seen on X-rays known as MRIs. This stands for magnetic resonance imaging. A regular X-ray taken of a bone bruise would appear to be normal. A bone bruise is a common injury in the knee and the heel bone (calcaneus). The problems are similar to those produced by stress fractures, which are bone injuries caused by overuse. A bone bruise may also be a sign of other injuries. For example, bone bruises are commonly found where an anterior cruciate ligament (ACL) in the knee has been pulled away from the bone (ruptured). A ligament is a tough fibrous material that connects bones together to make our joints stable. Bruises of the bone last a lot longer than bruises of the muscle or tissues beneath the skin. Bone bruises can last from days to months and are often more severe and painful than other bruises. TREATMENT Because bone bruises are sudden injuries you cannot often prevent them, other than by being extremely careful. Some things you can do to improve the condition are:  Apply ice to the sore area for 15-20 minutes, 03-04 times per day while awake for the first 2 days. Put the ice in a plastic bag, and place a towel between the bag of ice and your skin.  Keep your bruised area raised (elevated) when possible to lessen swelling.  For activity:  Use  crutches when necessary; do not put weight on the injured leg until you are no longer tender.  You may walk on your affected part as the pain allows, or as instructed.  Start weight bearing gradually on the bruised part.  Continue to use crutches or a cane until you can stand without causing pain, or as instructed.  If a plaster splint was applied, wear the splint until you are seen for a follow-up examination. Rest it on nothing harder than a pillow the first 24 hours. Do not put weight on it. Do not get it wet. You may take it off to take a shower or bath.  If an air splint was applied, more air may be blown into or out of the splint as needed for comfort. You may take it off at night and to take a shower or bath.  Wiggle your toes in the splint several times per day if you are able.  You may have been given an elastic bandage to use with the plaster splint or alone. The splint is too tight if you have numbness, tingling or if your foot becomes cold and blue. Adjust the bandage to make it comfortable.  Only take over-the-counter or prescription medicines for pain, discomfort, or fever as directed by your caregiver.  Follow all instructions for follow up with your caregiver. This includes any orthopedic referrals, physical therapy, and rehabilitation. Any delay in  obtaining necessary care could result in a delay or failure of the bones to heal. SEEK MEDICAL CARE IF:   You have an increase in bruising, swelling, or pain.  You notice coldness of your toes.  You do not get pain relief with medications. SEEK IMMEDIATE MEDICAL CARE IF:   Your toes are numb or blue.  You have severe pain not controlled with medications.  If any of the problems that caused you to seek care are becoming worse. Document Released: 07/11/2003 Document Revised: 07/13/2011 Document Reviewed: 11/30/2012 [facility] Patient Information 2014 [place], [place].  Hand Contusion A hand contusion is a deep bruise on  your hand area. Contusions are the result of an injury that caused bleeding under the skin. The contusion may turn blue, purple, or yellow. Minor injuries will give you a painless contusion, but more severe contusions may stay painful and swollen for a few weeks. CAUSES  A contusion is usually caused by a blow, trauma, or direct force to an area of the body. SYMPTOMS   Swelling and redness of the injured area.  Discoloration of the injured area.  Tenderness and soreness of the injured area.  Pain. DIAGNOSIS  The diagnosis can be made by taking a history and performing a physical exam. An X-ray, CT scan, or MRI may be needed to determine if there were any associated injuries, such as broken bones (fractures). TREATMENT  Often, the best treatment for a hand contusion is resting, elevating, icing, and applying cold compresses to the injured area. Over-the-counter medicines may also be recommended for pain control. HOME CARE INSTRUCTIONS   Put ice on the injured area.  Put ice in a plastic bag.  Place a towel between your skin and the bag.  Leave the ice on for 15-20 minutes, 03-04 times a day.  Only take over-the-counter or prescription medicines as directed by your caregiver. Your caregiver may recommend avoiding anti-inflammatory medicines (aspirin , ibuprofen , and naproxen) for 48 hours because these medicines may increase bruising.  If told, use an elastic wrap as directed. This can help reduce swelling. You may remove the wrap for sleeping, showering, and bathing. If your fingers become numb, cold, or blue, take the wrap off and reapply it more loosely.  Elevate your hand with pillows to reduce swelling.  Avoid overusing your hand if it is painful. SEEK IMMEDIATE MEDICAL CARE IF:   You have increased redness, swelling, or pain in your hand.  Your swelling or pain is not relieved with medicines.  You have loss of feeling in your hand or are unable to move your fingers.  Your  hand turns cold or blue.  You have pain when you move your fingers.  Your hand becomes warm to the touch.  Your contusion does not improve in 2 days. MAKE SURE YOU:   Understand these instructions.  Will watch your condition.  Will get help right away if you are not doing well or get worse. Document Released: 10/10/2001 Document Revised: 01/13/2012 Document Reviewed: 10/12/2011 [facility] Patient Information 2014 [place], [place].  Contusion A contusion is a deep bruise. Contusions are the result of an injury that caused bleeding under the skin. The contusion may turn blue, purple, or yellow. Minor injuries will give you a painless contusion, but more severe contusions may stay painful and swollen for a few weeks.  CAUSES  A contusion is usually caused by a blow, trauma, or direct force to an area of the body. SYMPTOMS   Swelling and redness of the  injured area.  Bruising of the injured area.  Tenderness and soreness of the injured area.  Pain. DIAGNOSIS  The diagnosis can be made by taking a history and physical exam. An X-ray, CT scan, or MRI may be needed to determine if there were any associated injuries, such as fractures. TREATMENT  Specific treatment will depend on what area of the body was injured. In general, the best treatment for a contusion is resting, icing, elevating, and applying cold compresses to the injured area. Over-the-counter medicines may also be recommended for pain control. Ask your caregiver what the best treatment is for your contusion. HOME CARE INSTRUCTIONS   Put ice on the injured area.  Put ice in a plastic bag.  Place a towel between your skin and the bag.  Leave the ice on for 15-20 minutes, 03-04 times a day.  Only take over-the-counter or prescription medicines for pain, discomfort, or fever as directed by your caregiver. Your caregiver may recommend avoiding anti-inflammatory medicines (aspirin , ibuprofen , and naproxen) for 48 hours because  these medicines may increase bruising.  Rest the injured area.  If possible, elevate the injured area to reduce swelling. SEEK IMMEDIATE MEDICAL CARE IF:   You have increased bruising or swelling.  You have pain that is getting worse.  Your swelling or pain is not relieved with medicines. MAKE SURE YOU:   Understand these instructions.  Will watch your condition.  Will get help right away if you are not doing well or get worse. Document Released: 01/28/2005 Document Revised: 07/13/2011 Document Reviewed: 02/23/2011 [facility] Patient Information 2014 [place], [place].

## 2013-04-22 NOTE — ED Provider Notes (Signed)
Medical screening examination/treatment/procedure(s) were performed by non-physician practitioner and as supervising physician I was immediately available for consultation/collaboration.  EKG Interpretation   None         Roney Marion, MD 04/22/13 [phone]

## 2013-05-15 ENCOUNTER — Other Ambulatory Visit: Payer: Self-pay | Admitting: Cardiovascular Disease

## 2013-07-01 ENCOUNTER — Encounter: Payer: Self-pay | Admitting: Oncology

## 2013-07-27 ENCOUNTER — Other Ambulatory Visit: Payer: Self-pay | Admitting: Family Medicine

## 2013-07-27 ENCOUNTER — Other Ambulatory Visit: Payer: Self-pay | Admitting: Obstetrics and Gynecology

## 2013-07-27 DIAGNOSIS — N6320 Unspecified lump in the left breast, unspecified quadrant: Secondary | ICD-10-CM

## 2013-08-14 ENCOUNTER — Other Ambulatory Visit: Payer: Self-pay | Admitting: Cardiovascular Disease

## 2013-09-11 ENCOUNTER — Other Ambulatory Visit: Payer: Self-pay | Admitting: Cardiovascular Disease

## 2013-09-18 ENCOUNTER — Ambulatory Visit (INDEPENDENT_AMBULATORY_CARE_PROVIDER_SITE_OTHER): Payer: 59 | Admitting: Cardiovascular Disease

## 2013-09-18 ENCOUNTER — Encounter: Payer: Self-pay | Admitting: Cardiovascular Disease

## 2013-09-18 VITALS — BP 150/90 | HR 66 | Ht 64.5 in | Wt 159.0 lb

## 2013-09-18 DIAGNOSIS — E785 Hyperlipidemia, unspecified: Secondary | ICD-10-CM

## 2013-09-18 DIAGNOSIS — I10 Essential (primary) hypertension: Secondary | ICD-10-CM

## 2013-09-18 NOTE — Assessment & Plan Note (Signed)
Caitlin Walker is doing well.   Her BB is a bit elevated - she ran out of her meds this weekend.   She is doing fine.  She will be retiring this summer.    She plans on walking more after retirment.

## 2013-09-18 NOTE — Patient Instructions (Addendum)
Your physician recommends that you continue on your current medications as directed. Please refer to the Current Medication list given to you today.  Your physician wants you to follow-up in: 6 months with Dr. Acie Fredrickson. You will receive a reminder letter in the mail two months in advance. If you don't receive a letter, please call our office to schedule the follow-up appointment.  Your physician recommends that you return for lab work in: 6 months on the day of or a few days before you see Dr. Acie Fredrickson.  You will need to fast for this appointment - nothing to eat or drink after midnight the night before except water

## 2013-09-18 NOTE — Progress Notes (Signed)
Caitlin Walker Date of Birth  [birth year] [place] [address]    [place]   [place], [place]  [address]    [place], [place]  [address] [phone]  Fax  [phone]  [phone]  Fax [phone]   Problem list: 1. Hypertension 2. Insomnia  History of Present Illness:  Caitlin Walker is a 68 year old female with a history of hypertension. She has done very well. She's not having episodes of chest pain or shortness of breath. She avoids eating any extra salt.  She exercises several times a week.  She is having trouble with insomnia.  May 12, 2012: She has been doing well from a cardiac standpoint.  She has had some lightheadness.  She also has had some leg cramps.  She has not been exercising.  She has been watching her salt intake.   She still works full time in the [employer].  Oct. 1, 2014:  Caitlin Walker is doing ok.  Still has leg cramps.  She has been busy - her was diagnosed with breast cancer.  She is running around lots .  No CP or dyspnea.  Has occasional indigestion.   She works in the [employer].    Nov. 12, 2014 She is doing ok  Sep 18, 2013:    Current Outpatient Prescriptions on File Prior to Visit  Medication Sig Dispense Refill  . aspirin 81 MG tablet Take 81 mg by mouth every evening.       . calcium carbonate (OS-CAL) 600 MG TABS Take 600 mg by mouth daily with breakfast.       . hydrochlorothiazide (MICROZIDE) 12.5 MG capsule TAKE 2 CAPSULES BY MOUTH EVERY MORNING.  180 capsule  0  . losartan (COZAAR) 100 MG tablet Take 1 tablet (100 mg total) by mouth daily.  90 tablet  3  . magnesium oxide (MAG-OX) 400 MG tablet Take 400 mg by mouth every evening.       . potassium chloride (K-DUR,KLOR-CON) 10 MEQ tablet TAKE 1 TABLET BY MOUTH ONCE DAILY  30 tablet  0  . Prenatal Vit-Fe Fumarate-FA (MULTIVITAMIN-PRENATAL) 27-0.8 MG TABS Take 1 tablet by mouth every morning.       . zolpidem (AMBIEN CR) 12.5 MG CR tablet Take  1 tablet (12.5 mg total) by mouth at bedtime as needed for sleep.  30 tablet  1   No current facility-administered medications on file prior to visit.    Allergies  Allergen Reactions  . Prednisone Swelling  . Sulfa Antibiotics     rash  . Bactrim Rash  . Flomax [Tamsulosin Hcl] Rash    Past Medical History  Diagnosis Date  . Hypertension   . Personal history of kidney stones 2010    cystoscopy done right ureter unable to pass scope, passed stone on own  . Chest pain at rest   . Leukopenia 11/02/2012    WBC 2,800 38 poly, 49 lymph 09/20/12;  3,200 45 poly, 46 lymphs 04/01/10    Past Surgical History  Procedure Laterality Date  . Appendectomy    . Abdominal hysterectomy    . Cystoscopy    . Colonoscopy N/A 08/01/2012    Procedure: COLONOSCOPY;  Surgeon: Jeryl Columbia, MD;  Location: [facility];  Service: Endoscopy;  Laterality: N/A;  pt. would like dan to do procedure    History  Smoking status  . Never Smoker   Smokeless tobacco  . Not on  file    History  Alcohol Use No    Family History  Problem Relation Age of Onset  . Heart disease Father   . Hypertension Father   . Hypertension Sister   . Hyperlipidemia Sister   . Hypertension Brother   . Hypertension Sister   . Colon cancer Sister   . Hypertension Sister   . Hypertension Sister   . Hypertension Sister   . Hyperlipidemia Sister   . Hypertension Brother   . Hypertension Brother   . Hyperlipidemia Brother     Reviw of Systems:  Reviewed in the HPI.  All other systems are negative.  Physical Exam: BP 150/90  Pulse 66  Ht 5' 4.5" (1.638 m)  Wt 159 lb (72.122 kg)  BMI 26.88 kg/m2 The patient is alert and oriented x 3.  The mood and affect are normal.   Skin: warm and dry.  Color is normal.    HEENT:   Normocephalic/atraumatic. Her carotids are normal. There is no JVD.  Lungs: Lungs are clear.   Heart: Regular S1-S2.    Abdomen: Good bowel sounds. No hepatosplenomegaly.  Extremities:  No  clubbing cyanosis or edema.  Neuro:  There exam is nonfocal. Her gait is normal. Cranial nerves are intact.    ECG: Oct. 1, 2014:  Sinus brady at 63. Normal ECG Assessment / Plan:

## 2013-10-05 ENCOUNTER — Ambulatory Visit
Admission: RE | Admit: 2013-10-05 | Discharge: 2013-10-05 | Disposition: A | Discharge status: Home / Self Care | Payer: 59 | Source: Ambulatory Visit | Attending: Obstetrics and Gynecology | Admitting: Obstetrics and Gynecology

## 2013-10-05 DIAGNOSIS — N6320 Unspecified lump in the left breast, unspecified quadrant: Secondary | ICD-10-CM

## 2013-10-09 ENCOUNTER — Other Ambulatory Visit: Payer: Self-pay | Admitting: Cardiovascular Disease

## 2013-10-19 ENCOUNTER — Other Ambulatory Visit: Payer: Self-pay | Admitting: Urology

## 2013-10-23 ENCOUNTER — Encounter (HOSPITAL_BASED_OUTPATIENT_CLINIC_OR_DEPARTMENT_OTHER): Payer: Self-pay | Admitting: *Deleted

## 2013-10-25 ENCOUNTER — Encounter (HOSPITAL_BASED_OUTPATIENT_CLINIC_OR_DEPARTMENT_OTHER): Payer: Self-pay | Admitting: *Deleted

## 2013-10-25 NOTE — Progress Notes (Signed)
NPO AFTER MN. ARRIVE AT 3744. NEEDS ISTAT 8. CURRENT EKG IN CHART AND EPIC.

## 2013-10-26 ENCOUNTER — Encounter (HOSPITAL_BASED_OUTPATIENT_CLINIC_OR_DEPARTMENT_OTHER)
Admission: RE | Disposition: A | Discharge status: Home / Self Care | Payer: Self-pay | Source: Ambulatory Visit | Attending: Urology

## 2013-10-26 ENCOUNTER — Ambulatory Visit (HOSPITAL_BASED_OUTPATIENT_CLINIC_OR_DEPARTMENT_OTHER): Payer: 59 | Admitting: Anesthesiology

## 2013-10-26 ENCOUNTER — Encounter (HOSPITAL_BASED_OUTPATIENT_CLINIC_OR_DEPARTMENT_OTHER): Payer: 59 | Admitting: Anesthesiology

## 2013-10-26 ENCOUNTER — Ambulatory Visit (HOSPITAL_BASED_OUTPATIENT_CLINIC_OR_DEPARTMENT_OTHER)
Admission: RE | Admit: 2013-10-26 | Discharge: 2013-10-26 | Disposition: A | Discharge status: Home / Self Care | Payer: 59 | Source: Ambulatory Visit | Attending: Urology | Admitting: Urology

## 2013-10-26 ENCOUNTER — Encounter (HOSPITAL_BASED_OUTPATIENT_CLINIC_OR_DEPARTMENT_OTHER): Payer: Self-pay | Admitting: *Deleted

## 2013-10-26 DIAGNOSIS — N2889 Other specified disorders of kidney and ureter: Secondary | ICD-10-CM | POA: Insufficient documentation

## 2013-10-26 DIAGNOSIS — Z882 Allergy status to sulfonamides status: Secondary | ICD-10-CM | POA: Insufficient documentation

## 2013-10-26 DIAGNOSIS — N362 Urethral caruncle: Secondary | ICD-10-CM | POA: Insufficient documentation

## 2013-10-26 DIAGNOSIS — Z79899 Other long term (current) drug therapy: Secondary | ICD-10-CM | POA: Insufficient documentation

## 2013-10-26 DIAGNOSIS — R3129 Other microscopic hematuria: Secondary | ICD-10-CM | POA: Insufficient documentation

## 2013-10-26 DIAGNOSIS — I10 Essential (primary) hypertension: Secondary | ICD-10-CM | POA: Insufficient documentation

## 2013-10-26 HISTORY — DX: Unspecified hemorrhoids: K64.9

## 2013-10-26 HISTORY — DX: Personal history of other diseases of the digestive system: Z87.19

## 2013-10-26 HISTORY — DX: Hyperlipidemia, unspecified: E78.5

## 2013-10-26 HISTORY — DX: Personal history of colonic polyps: Z86.010

## 2013-10-26 HISTORY — PX: CYSTOSCOPY/RETROGRADE/URETEROSCOPY: SHX5316

## 2013-10-26 HISTORY — DX: Decreased white blood cell count, unspecified: D72.819

## 2013-10-26 HISTORY — DX: Presence of spectacles and contact lenses: Z97.3

## 2013-10-26 HISTORY — DX: Personal history of colon polyps, unspecified: Z86.0100

## 2013-10-26 HISTORY — DX: Other specified disorders of kidney and ureter: N28.89

## 2013-10-26 HISTORY — DX: Hematuria, unspecified: R31.9

## 2013-10-26 LAB — POCT I-STAT, CHEM 8
BUN: 9 mg/dL (ref 6–23)
Calcium, Ion: 1.3 mmol/L (ref 1.13–1.30)
Chloride: 101 meq/L (ref 96–112)
Creatinine, Ser: 0.7 mg/dL (ref 0.50–1.10)
Glucose, Bld: 89 mg/dL (ref 70–99)
HCT: 39 % (ref 36.0–46.0)
Hemoglobin: 13.3 g/dL (ref 12.0–15.0)
Potassium: 4 meq/L (ref 3.7–5.3)
Sodium: 143 meq/L (ref 137–147)
TCO2: 26 mmol/L (ref 0–100)

## 2013-10-26 SURGERY — CYSTOSCOPY/RETROGRADE/URETEROSCOPY
Anesthesia: General | Site: Ureter | Laterality: Left

## 2013-10-26 MED ORDER — GENTAMICIN IN SALINE 1.6-0.9 MG/ML-% IV SOLN
80.0000 mg | INTRAVENOUS | Status: DC
  Filled 2013-10-26: qty 50

## 2013-10-26 MED ORDER — OXYBUTYNIN CHLORIDE 5 MG PO TABS: 5.0000 mg | ORAL_TABLET | Freq: Three times a day (TID) | ORAL | Status: DC | PRN

## 2013-10-26 MED ORDER — LIDOCAINE HCL (CARDIAC) 20 MG/ML IV SOLN
INTRAVENOUS | Status: DC | PRN
  Administered 2013-10-26: 60 mg via INTRAVENOUS

## 2013-10-26 MED ORDER — 0.9 % SODIUM CHLORIDE (POUR BTL) OPTIME
TOPICAL | Status: DC | PRN
  Administered 2013-10-26: 1000 mL

## 2013-10-26 MED ORDER — FENTANYL CITRATE 0.05 MG/ML IJ SOLN
25.0000 ug | INTRAMUSCULAR | Status: DC | PRN
  Filled 2013-10-26: qty 1

## 2013-10-26 MED ORDER — DEXAMETHASONE SODIUM PHOSPHATE 4 MG/ML IJ SOLN
INTRAMUSCULAR | Status: DC | PRN
  Administered 2013-10-26: 10 mg via INTRAVENOUS

## 2013-10-26 MED ORDER — LACTATED RINGERS IV SOLN
INTRAVENOUS | Status: DC
  Administered 2013-10-26: 10:00:00 via INTRAVENOUS
  Filled 2013-10-26: qty 1000

## 2013-10-26 MED ORDER — FENTANYL CITRATE 0.05 MG/ML IJ SOLN
INTRAMUSCULAR | Status: DC | PRN
  Administered 2013-10-26 (×8): 25 ug via INTRAVENOUS

## 2013-10-26 MED ORDER — SENNOSIDES-DOCUSATE SODIUM 8.6-50 MG PO TABS: 1.0000 | ORAL_TABLET | Freq: Two times a day (BID) | ORAL | Status: DC

## 2013-10-26 MED ORDER — DEXAMETHASONE SODIUM PHOSPHATE 4 MG/ML IJ SOLN: INTRAMUSCULAR | Status: DC | PRN

## 2013-10-26 MED ORDER — IOHEXOL 300 MG/ML  SOLN
INTRAMUSCULAR | Status: DC | PRN
  Administered 2013-10-26: 20 mL via URETHRAL

## 2013-10-26 MED ORDER — MIDAZOLAM HCL 2 MG/2ML IJ SOLN
INTRAMUSCULAR | Status: AC
  Filled 2013-10-26: qty 2

## 2013-10-26 MED ORDER — PROPOFOL 10 MG/ML IV BOLUS
INTRAVENOUS | Status: DC | PRN
  Administered 2013-10-26: 200 mg via INTRAVENOUS

## 2013-10-26 MED ORDER — OXYBUTYNIN CHLORIDE 5 MG PO TABS
ORAL_TABLET | ORAL | Status: AC
  Filled 2013-10-26: qty 1

## 2013-10-26 MED ORDER — OXYCODONE-ACETAMINOPHEN 5-325 MG PO TABS
ORAL_TABLET | ORAL | Status: AC
  Filled 2013-10-26: qty 1

## 2013-10-26 MED ORDER — ONDANSETRON HCL 4 MG/2ML IJ SOLN
INTRAMUSCULAR | Status: DC | PRN
  Administered 2013-10-26: 4 mg via INTRAVENOUS

## 2013-10-26 MED ORDER — METOCLOPRAMIDE HCL 5 MG/ML IJ SOLN
INTRAMUSCULAR | Status: DC | PRN
  Administered 2013-10-26: 10 mg via INTRAVENOUS

## 2013-10-26 MED ORDER — BELLADONNA ALKALOIDS-OPIUM 16.2-60 MG RE SUPP
RECTAL | Status: AC
  Filled 2013-10-26: qty 1

## 2013-10-26 MED ORDER — MIDAZOLAM HCL 5 MG/5ML IJ SOLN
INTRAMUSCULAR | Status: DC | PRN
  Administered 2013-10-26 (×2): 1 mg via INTRAVENOUS

## 2013-10-26 MED ORDER — ACETAMINOPHEN 10 MG/ML IV SOLN
INTRAVENOUS | Status: DC | PRN
  Administered 2013-10-26: 1000 mg via INTRAVENOUS

## 2013-10-26 MED ORDER — OXYCODONE-ACETAMINOPHEN 5-325 MG PO TABS: 1.0000 | ORAL_TABLET | Freq: Four times a day (QID) | ORAL | Status: DC | PRN

## 2013-10-26 MED ORDER — LACTATED RINGERS IV SOLN
INTRAVENOUS | Status: DC | PRN
  Administered 2013-10-26 (×2): via INTRAVENOUS

## 2013-10-26 MED ORDER — OXYCODONE-ACETAMINOPHEN 5-325 MG PO TABS
1.0000 | ORAL_TABLET | ORAL | Status: DC | PRN
  Administered 2013-10-26: 1 via ORAL
  Filled 2013-10-26: qty 1

## 2013-10-26 MED ORDER — OXYBUTYNIN CHLORIDE 5 MG PO TABS
5.0000 mg | ORAL_TABLET | Freq: Once | ORAL | Status: AC
  Administered 2013-10-26: 5 mg via ORAL
  Filled 2013-10-26: qty 1

## 2013-10-26 MED ORDER — GENTAMICIN SULFATE 40 MG/ML IJ SOLN
5.0000 mg/kg | INTRAVENOUS | Status: DC
  Administered 2013-10-26: 310.5 mg via INTRAVENOUS
  Filled 2013-10-26: qty 7.75

## 2013-10-26 MED ORDER — KETOROLAC TROMETHAMINE 30 MG/ML IJ SOLN
INTRAMUSCULAR | Status: DC | PRN
  Administered 2013-10-26: 30 mg via INTRAVENOUS

## 2013-10-26 MED ORDER — SODIUM CHLORIDE 0.9 % IR SOLN
Status: DC | PRN
  Administered 2013-10-26: 6000 mL

## 2013-10-26 MED ORDER — FENTANYL CITRATE 0.05 MG/ML IJ SOLN
INTRAMUSCULAR | Status: AC
  Filled 2013-10-26: qty 4

## 2013-10-26 MED ORDER — CEPHALEXIN 500 MG PO CAPS: 500.0000 mg | ORAL_CAPSULE | Freq: Two times a day (BID) | ORAL | Status: DC

## 2013-10-26 SURGICAL SUPPLY — 21 items
BAG URO CATCHER STRL LF (DRAPE) ×2
BASKET LASER NITINOL 1.9FR (BASKET) ×2
BASKET ZERO TIP NITINOL 2.4FR (BASKET)
CANISTER SUCT LVC 12 LTR MEDI- (MISCELLANEOUS) ×2
CATH INTERMIT  6FR 70CM (CATHETERS)
CLOTH BEACON ORANGE TIMEOUT ST (SAFETY) ×2
DRAPE CAMERA CLOSED 9X96 (DRAPES) ×2
GLOVE BIO SURGEON STRL SZ7.5 (GLOVE) ×2
GLOVE SURG SS PI 7.5 STRL IVOR (GLOVE) ×4
GOWN PREVENTION PLUS XLARGE (GOWN DISPOSABLE)
GOWN STRL NON-REIN LRG LVL3 (GOWN DISPOSABLE)
GOWN STRL REUS W/TWL LRG LVL3 (GOWN DISPOSABLE) ×2
GOWN STRL REUS W/TWL XL LVL3 (GOWN DISPOSABLE) ×2
GUIDEWIRE ANG ZIPWIRE 038X150 (WIRE) ×2
GUIDEWIRE STR DUAL SENSOR (WIRE) ×2
IV NS IRRIG 3000ML ARTHROMATIC (IV SOLUTION) ×4
NS IRRIG 1000ML POUR BTL (IV SOLUTION) ×2
PACK CYSTOSCOPY (CUSTOM PROCEDURE TRAY) ×2
STENT POLARIS 5FRX22 (STENTS) ×2
SYRINGE 10CC LL (SYRINGE) ×2
TUBE FEEDING 8FR 16IN STR KANG (MISCELLANEOUS) ×2

## 2013-10-26 NOTE — H&P (Signed)
Caitlin Walker is an 68 y.o. female.    Chief Complaint: Pre-Op Cysto, Retrogrades, Left Ureteroscopy / possible biopsy  HPI:       1 - Gross Hematuria, Left Ureteral Mass v. Ureterocele - Pt with microhematuria noted at GYN most recently, one possible gross episode.  Long h/o microhematuria with negative eval x several including IVP's, cysto several years ago. Non smoker. No dye / chemical exposure. 10/2013 CT Urogram unremarkable. Cysto 10/2013  with mounding of left ureter c/w likely ureterocele, retrospectively appears possible filling defect by CT in this location.  2 - Urethral Caruncle - small incidental caruncle noted on exam, stable x several years, non-firable. Post-menapasal, not on estrogen.   3 - Urinary Urgency - Pt wtih occasional urinary urgency of modest bother, thinks it is related to prolonged periods of holding urine at work.  PMH sig for TAH/BSO (still has cervix), Appy, HTN, HLD. No CV disesae. No strong blood thinners.   Today [name] is seen to proceed with cysto, retrogrades, ureteroscopy / possible biopsy to charecterize her abnormal left distal ureter.   Past Medical History  Diagnosis Date  . Hypertension   . Ureterocele     LEFT  . Chronic leukopenia DX  2011    MILD--  HEMATOLOGIST  --  DR Beryle Beams  . Hyperlipidemia   . History of colon polyps     BENIGN  . Hematuria   . Wears contact lenses   . H/O hiatal hernia     TINY PER EGD 2007  . Hemorrhoids     INTERNAL AND EXTERNAL     Past Surgical History  Procedure Laterality Date  . Colonoscopy N/A 08/01/2012    Procedure: COLONOSCOPY;  Surgeon: Jeryl Columbia, MD;  Location: [facility];  Service: Endoscopy;  Laterality: N/A;  pt. would like dan to do procedure  . Supracervical abdominal hysterectomy  02-25-2000    W/   BILATERAL SALPINGOOPHORECTOMY AND RECTOCELE REPAIR  . Cysto/  urethral dilation /  right retrograde pyelogram/ right ureteroscopy/  instillation therapy  06-18-2008  . Dilation  and curettage of uterus  2001  . Appendectomy  1989    Family History  Problem Relation Age of Onset  . Heart disease Father   . Hypertension Father   . Hypertension Sister   . Hyperlipidemia Sister   . Hypertension Brother   . Hypertension Sister   . Colon cancer Sister   . Hypertension Sister   . Hypertension Sister   . Hypertension Sister   . Hyperlipidemia Sister   . Hypertension Brother   . Hypertension Brother   . Hyperlipidemia Brother    Social History:  reports that she has never smoked. She has never used smokeless tobacco. She reports that she does not drink alcohol or use illicit drugs.  Allergies:  Allergies  Allergen Reactions  . Prednisone Swelling  . Bactrim Rash  . Flomax [Tamsulosin Hcl] Rash  . Sulfa Antibiotics Rash    No prescriptions prior to admission    No results found for this or any previous visit (from the past 48 hour(s)). No results found.  Review of Systems  Constitutional: Negative.   HENT: Negative.   Eyes: Negative.   Respiratory: Negative.   Cardiovascular: Negative.   Gastrointestinal: Negative.   Genitourinary: Negative.   Musculoskeletal: Negative.   Skin: Negative.   Neurological: Negative.   Endo/Heme/Allergies: Negative.   Psychiatric/Behavioral: Negative.     Height 5' 4.5" (1.638 m), weight 72.122 kg (159 lb). Physical  Exam  Constitutional: She is oriented to person, place, and time. She appears well-developed and well-nourished.  HENT:  Head: Normocephalic and atraumatic.  Eyes: EOM are normal. Pupils are equal, round, and reactive to light.  Neck: Normal range of motion. Neck supple.  Cardiovascular: Normal rate and regular rhythm.   Respiratory: Effort normal and breath sounds normal.  GI: Soft. Bowel sounds are normal.  Genitourinary:  No CVAT  Musculoskeletal: Normal range of motion.  Neurological: She is alert and oriented to person, place, and time.  Skin: Skin is warm and dry.  Psychiatric: She has  a normal mood and affect. Her behavior is normal. Judgment and thought content normal.     Assessment/Plan  1 - Gross Hematuria, Left Ureteral Mass v. Ureterocele - unusual presentation. Most suspicion for ureterocele, but with hematuria and possibel filling defect I feel diagnostic ureeroscopy and possible TUR unroofing warranted for futher diagnostic purposes.  We rediscussed diagnostic ureteroscopy and possibble biopsy and unroofing ureterocele in detail.  We rediscussed risks including bleeding, infection, damage to kidney / ureter  bladder, rarely loss of kidney. We rediscussed anesthetic risks and rare but serious surgical complications including DVT, PE, MI, and mortality. The patient voiced understanding and wises to proceed.   2 - Urethral Caruncle - Discussed treatment of choice with topical estrogen for small symptomatic lesions. No h/o estrogen-sensitive malignancy. She opts for surveillance at this point as non-bothersome day to day basis. Marland Kitchen  3 - Urinary Urgency - modest bother, dicussed role of pharmacotherapy, but she wants to hold off, this is reasonable.    ,  10/26/2013, 6:29 AM

## 2013-10-26 NOTE — Anesthesia Procedure Notes (Signed)
Procedure Name: LMA Insertion Date/Time: 10/26/2013 12:17 PM Performed by: Justice Rocher Pre-anesthesia Checklist: Patient identified, Emergency Drugs available, Suction available and Patient being monitored Patient Re-evaluated:Patient Re-evaluated prior to inductionOxygen Delivery Method: Circle System Utilized Preoxygenation: Pre-oxygenation with 100% oxygen Intubation Type: IV induction Ventilation: Mask ventilation without difficulty LMA: LMA inserted LMA Size: 4.0 Number of attempts: 1 Airway Equipment and Method: bite block Placement Confirmation: positive ETCO2 Tube secured with: Tape Dental Injury: Teeth and Oropharynx as per pre-operative assessment

## 2013-10-26 NOTE — Anesthesia Preprocedure Evaluation (Signed)
Anesthesia Evaluation  Patient identified by MRN, date of birth, ID band Patient awake    Reviewed: Allergy & Precautions, H&P , NPO status , Patient's Chart, lab work & pertinent test results  Airway Mallampati: II TM Distance: >3 FB Neck ROM: Full    Dental no notable dental hx.    Pulmonary neg pulmonary ROS,  breath sounds clear to auscultation  Pulmonary exam normal       Cardiovascular Exercise Tolerance: Good hypertension, Pt. on medications Rhythm:Regular Rate:Normal     Neuro/Psych negative neurological ROS  negative psych ROS   GI/Hepatic Neg liver ROS, hiatal hernia,   Endo/Other  negative endocrine ROS  Renal/GU negative Renal ROS  negative genitourinary   Musculoskeletal negative musculoskeletal ROS (+)   Abdominal   Peds negative pediatric ROS (+)  Hematology Chronic leukopenia   Anesthesia Other Findings   Reproductive/Obstetrics negative OB ROS                           Anesthesia Physical Anesthesia Plan  ASA: II  Anesthesia Plan: General   Post-op Pain Management:    Induction: Intravenous  Airway Management Planned: LMA  Additional Equipment:   Intra-op Plan:   Post-operative Plan: Extubation in OR  Informed Consent: I have reviewed the patients History and Physical, chart, labs and discussed the procedure including the risks, benefits and alternatives for the proposed anesthesia with the patient or authorized representative who has indicated his/her understanding and acceptance.   Dental advisory given  Plan Discussed with: CRNA  Anesthesia Plan Comments:         Anesthesia Quick Evaluation

## 2013-10-26 NOTE — Discharge Instructions (Signed)
1 - You may have urinary urgency (bladder spasms) and bloody urine on / off with stent in place. This is normal.  2 - Call MD or go to ER for fever >102, severe pain / nausea / vomiting not relieved by medications, or acute change in medical status  3 - Remove tethered stent on Monday morning at home by pulling on string, then blue/white plastic tubing, and discarding. Dr. Tresa Moore is in the office Monday if any issues arise.  Post Anesthesia Home Care Instructions  Activity: Get plenty of rest for the remainder of the day. A responsible adult should stay with you for 24 hours following the procedure.  For the next 24 hours, DO NOT: -Drive a car -Paediatric nurse -Drink alcoholic beverages -Take any medication unless instructed by your physician -Make any legal decisions or sign important papers.  Meals: Start with liquid foods such as gelatin or soup. Progress to regular foods as tolerated. Avoid greasy, spicy, heavy foods. If nausea and/or vomiting occur, drink only clear liquids until the nausea and/or vomiting subsides. Call your physician if vomiting continues.  Special Instructions/Symptoms: Your throat may feel dry or sore from the anesthesia or the breathing tube placed in your throat during surgery. If this causes discomfort, gargle with warm salt water. The discomfort should disappear within 24 hours. [facility] [phone] Post Ureteroscopy With or Without Stent Instructions  Definitions:  Ureter: The duct that transports urine from the kidney to the bladder. Stent:   A plastic hollow tube that is placed into the ureter, from the kidney to the                 bladder to prevent the ureter from swelling shut.  GENERAL INSTRUCTIONS:  Despite the fact that no skin incisions were used, the area around the ureter and bladder is raw and irritated. The stent is a foreign body which will further irritate the bladder wall. This irritation is manifested by increased  frequency of urination, both day and night, and by an increase in the urge to urinate. In some, the urge to urinate is present almost always. Sometimes the urge is strong enough that you may not be able to stop yourself from urinating. The only real cure is to remove the stent and then give time for the bladder wall to heal which can't be done until the danger of the ureter swelling shut has passed, which varies.  You may see some blood in your urine while the stent is in place and a few days afterwards. Do not be alarmed, even if the urine was clear for a while. Get off your feet and drink lots of fluids until clearing occurs. If you start to pass clots or don't improve, call us.  DIET: You may return to your normal diet immediately. Because of the raw surface of your bladder, alcohol, spicy foods, acid type foods and drinks with caffeine may cause irritation or frequency and should be used in moderation. To keep your urine flowing freely and to avoid constipation, drink plenty of fluids during the day ( 8-10 glasses ). Tip: Avoid cranberry juice because it is very acidic.  ACTIVITY: Your physical activity doesn't need to be restricted. However, if you are very active, you may see some blood in your urine. We suggest that you reduce your activity under these circumstances until the bleeding has stopped.  BOWELS: It is important to keep your bowels regular during the postoperative period. Straining with bowel movements can  cause bleeding. A bowel movement every other day is reasonable. Use a mild laxative if needed, such as Milk of Magnesia 2-3 tablespoons, or 2 Dulcolax tablets. Call if you continue to have problems. If you have been taking narcotics for pain, before, during or after your surgery, you may be constipated. Take a laxative if necessary.   MEDICATION: You should resume your pre-surgery medications unless told not to. In addition you will often be given an antibiotic to prevent  infection. These should be taken as prescribed until the bottles are finished unless you are having an unusual reaction to one of the drugs.  PROBLEMS YOU SHOULD REPORT TO [place]:  Fevers over 100.5 Fahrenheit.  Heavy bleeding, or clots ( See above notes about blood in urine ).  Inability to urinate.  Drug reactions ( hives, rash, nausea, vomiting, diarrhea ).  Severe burning or pain with urination that is not improving.  FOLLOW-UP: You will need a follow-up appointment to monitor your progress. Call for this appointment at the number listed above. Usually the first appointment will be about three to fourteen days after your surgery.

## 2013-10-26 NOTE — Transfer of Care (Signed)
Immediate Anesthesia Transfer of Care Note  Patient: Caitlin Walker  Procedure(s) Performed: Procedure(s) (LRB): CYSTOSCOPY/RETROGRADE/DIAGNOSTIC DIGITAL URETEROSCOPY WITH LEFT URETERAL STENT PLACEMENT. (Left)  Patient Location: PACU  Anesthesia Type: General  Level of Consciousness: awake, sedated, patient cooperative and responds to stimulation  Airway & Oxygen Therapy: Patient Spontanous Breathing and Patient connected to face mask oxygen  Post-op Assessment: Report given to PACU RN, Post -op Vital signs reviewed and stable and Patient moving all extremities  Post vital signs: Reviewed and stable  Complications: No apparent anesthesia complications

## 2013-10-26 NOTE — Brief Op Note (Signed)
10/26/2013  12:52 PM  PATIENT:  Caitlin Walker  68 y.o. female  PRE-OPERATIVE DIAGNOSIS:  LEFT URETEROCELE, hematuria  POST-OPERATIVE DIAGNOSIS:  LEFT URETEROCELE, hematuria  PROCEDURE:  Procedure(s): CYSTOSCOPY/RETROGRADE/DIAGNOSTIC DIGITAL URETEROSCOPY WITH LEFT URETERAL STENT PLACEMENT. (Left)  SURGEON:  Surgeon(s) and Role:    * Alexis Frock, MD - Primary  PHYSICIAN ASSISTANT:   ASSISTANTS: none   ANESTHESIA:   general  EBL:  Total I/O In: 1000 [I.V.:1000] Out: -   BLOOD ADMINISTERED:none  DRAINS: none   LOCAL MEDICATIONS USED:  NONE  SPECIMEN:  No Specimen  DISPOSITION OF SPECIMEN:  N/A  COUNTS:  YES  TOURNIQUET:  * No tourniquets in log *  DICTATION: .Other Dictation: Dictation Number [id number]  PLAN OF CARE: Discharge to home after PACU  PATIENT DISPOSITION:  PACU - hemodynamically stable.   Delay start of Pharmacological VTE agent (>24hrs) due to surgical blood loss or risk of bleeding: not applicable

## 2013-10-27 NOTE — Op Note (Signed)
NAME:  [name], [name] NO.:  [id number]  MEDICAL RECORD NO.:  [id number]  LOCATION:                                 FACILITY:  PHYSICIAN:  Alexis Frock, MD     DATE OF BIRTH:  09-22-1945  DATE OF PROCEDURE:  10/26/2013 DATE OF DISCHARGE:  10/26/2013                              OPERATIVE REPORT   PREOPERATIVE DIAGNOSIS:  Recurrent microscopic hematuria, left ureteral abnormality.  POSTOPERATIVE DIAGNOSIS:  Microscopic hematuria, left non-obstructing ureterocele.  PROCEDURE: 1. Cystoscopy, left retrograde pyelogram interpretation. 2. Left diagnostic ureteroscopy. 3. Insertion of left ureteral stent, 5 x 22 with tether to the thigh.  ESTIMATED BLOOD LOSS:  Nil.  COMPLICATIONS:  None.  SPECIMENS:  None.  FINDINGS: 1. Mounding of the left ureteral orifice with some ballooning with     ureteral peristalsis consistent likely ureterocele. 2. Confirmation of left nonobstructing distal ureterocele by     diagnostic ureteroscopy. 3. No filling defects or abnormalities in the entire length of the     left ureter or left kidney. 4. Placement of left ureteral stent, proximal curl in the mid pole     distal and urinary bladder. 5. Ureterocele easily accommodating 2 wires and 6-French ureteroscope.     No hydronephrosis thus confirming, again nonobstructive.  INDICATIONS:  [name] is a very pleasant 68 year old lady with a long history of microscopic hematuria and several questionable episodes of gross hematuria.  She was referred for repeat evaluation of this after a questionable gross hematuria episode.  Evaluation with exam, labs, CT urogram, and office cystoscopy only revealed a left ureteral abnormality consistent with likely ureterocele versus mass, to rule out neoplasm so that cysto with left retrograde and diagnostic ureteroscopy was warranted and she wished to proceed.  Informed consent was obtained and placed in medical record.  PROCEDURE IN  DETAIL:  The patient being Caitlin Walker, procedure being cysto with left retrograde, left diagnostic ureteroscopy was confirmed.  Procedures carried out.  Time-out was performed. Intravenous antibiotics were administered.  General LMA anesthesia was induced.  Patient was placed into a low lithotomy position.  Sterile field was created prepping and draping patient's vagina, introitus, and proximal thighs using iodine x3.  Next, cystourethroscopy performed using a 22-French rigid cystoscope 12-degree offset lens.  Inspection of the urinary bladder revealed no diverticula, calcifications, papular lesions.  The left ureteral orifice had a large mounding look to it that ballooned somewhat with peristalsis.  This was felt to be most consistent with likely ureterocele.  The left ureteral orifice was cannulated 1st with a angle-tip Glidewire and then subsequently a 6- French end-hole catheter and left retrograde pyelogram was obtained.  Left retrograde pyelogram demonstrates a single left ureter, single system left kidney.  No filling defects or narrowing noted.  The Glidewire was advanced at the level of the upper pole and set aside as a safety wire.  Next, semi-rigid ureteroscopy was performed of the distal four-fifths of the left ureter alongside a separate sensor working wire. Additional care was taken to provide a complete inspection of the distal ureter, and indeed this was felt to represent a ureterocele.  There were no intraluminal filling defects in this location,  and ureteral orifice did easily accommodate 2 wires and a semi-rigid scope.  Next, to rule out left-sided urothelial lesions, the semi-rigid ureteroscope was exchanged for a 6-French digital ureteroscope over the sensor working wire to the level of the renal pelvis, and flexible digital ureteroscopy was performed.  Systematic inspection of each calix x2 revealed no calcifications, no papular lesions.  No evidence  of perforation.  The ureteroscope was slowly withdrawn.  The entire length of left ureter was once again inspected and no newer worrisome findings were seen.  A decision was made to proceed with temporary stenting.  As such a 5 x 22 Polaris type stent was carefully advanced using fluoroscopic guidance over the remaining safety wire, proximal and deployed in the renal pelvis and distal in the urinary bladder.  Bladder was then emptied per cystoscope, tether of the stent was fashioned at the time procedure was terminated.  The patient tolerated the procedure well.  There were no immediate periprocedural complications.  The patient was taken to the postanesthesia care unit in stable condition.          ______________________________ Alexis Frock, MD     TM/MEDQ  D:  10/26/2013  T:  10/27/2013  Job:  [id number]

## 2013-10-27 NOTE — Anesthesia Postprocedure Evaluation (Signed)
  Anesthesia Post-op Note  Patient: Caitlin Walker  Procedure(s) Performed: Procedure(s) (LRB): CYSTOSCOPY/RETROGRADE/DIAGNOSTIC DIGITAL URETEROSCOPY WITH LEFT URETERAL STENT PLACEMENT. (Left)  Patient Location: PACU  Anesthesia Type: General  Level of Consciousness: awake and alert   Airway and Oxygen Therapy: Patient Spontanous Breathing  Post-op Pain: mild  Post-op Assessment: Post-op Vital signs reviewed, Patient's Cardiovascular Status Stable, Respiratory Function Stable, Patent Airway and No signs of Nausea or vomiting  Last Vitals:  Filed Vitals:   10/26/13 1517  BP: 129/80  Pulse: 55  Temp: 36.1 C  Resp: 18    Post-op Vital Signs: stable   Complications: No apparent anesthesia complications

## 2013-10-30 ENCOUNTER — Encounter (HOSPITAL_BASED_OUTPATIENT_CLINIC_OR_DEPARTMENT_OTHER): Payer: Self-pay | Admitting: Urology

## 2013-11-17 ENCOUNTER — Other Ambulatory Visit: Payer: Self-pay | Admitting: Cardiovascular Disease

## 2013-12-06 ENCOUNTER — Other Ambulatory Visit: Payer: Self-pay

## 2013-12-06 MED ORDER — LOSARTAN POTASSIUM 100 MG PO TABS: 100.0000 mg | ORAL_TABLET | Freq: Every evening | ORAL | Status: DC

## 2013-12-06 MED ORDER — POTASSIUM CHLORIDE CRYS ER 10 MEQ PO TBCR: EXTENDED_RELEASE_TABLET | ORAL | Status: DC

## 2014-03-12 ENCOUNTER — Ambulatory Visit (INDEPENDENT_AMBULATORY_CARE_PROVIDER_SITE_OTHER): Payer: Commercial Managed Care - HMO | Admitting: Cardiovascular Disease

## 2014-03-12 ENCOUNTER — Encounter: Payer: Self-pay | Admitting: Cardiovascular Disease

## 2014-03-12 ENCOUNTER — Telehealth: Payer: Self-pay | Admitting: Cardiovascular Disease

## 2014-03-12 VITALS — BP 140/90 | HR 56 | Ht 64.5 in | Wt 161.4 lb

## 2014-03-12 DIAGNOSIS — E785 Hyperlipidemia, unspecified: Secondary | ICD-10-CM | POA: Insufficient documentation

## 2014-03-12 DIAGNOSIS — I10 Essential (primary) hypertension: Secondary | ICD-10-CM

## 2014-03-12 LAB — LIPID PANEL
Cholesterol: 189 mg/dL (ref 0–200)
HDL: 74.2 mg/dL
LDL Cholesterol: 90 mg/dL (ref 0–99)
NonHDL: 114.8
Total CHOL/HDL Ratio: 3
Triglycerides: 125 mg/dL (ref 0.0–149.0)
VLDL: 25 mg/dL (ref 0.0–40.0)

## 2014-03-12 LAB — HEPATIC FUNCTION PANEL
ALT: 14 U/L (ref 0–35)
AST: 17 U/L (ref 0–37)
Albumin: 3.6 g/dL (ref 3.5–5.2)
Alkaline Phosphatase: 68 U/L (ref 39–117)
Bilirubin, Direct: 0 mg/dL (ref 0.0–0.3)
Total Bilirubin: 0.4 mg/dL (ref 0.2–1.2)
Total Protein: 7.2 g/dL (ref 6.0–8.3)

## 2014-03-12 LAB — BASIC METABOLIC PANEL WITH GFR
BUN: 12 mg/dL (ref 6–23)
CO2: 28 meq/L (ref 19–32)
Calcium: 9.5 mg/dL (ref 8.4–10.5)
Chloride: 101 meq/L (ref 96–112)
Creatinine, Ser: 0.6 mg/dL (ref 0.4–1.2)
GFR: 126.09 mL/min
Glucose, Bld: 86 mg/dL (ref 70–99)
Potassium: 3.8 meq/L (ref 3.5–5.1)
Sodium: 139 meq/L (ref 135–145)

## 2014-03-12 MED ORDER — LOSARTAN POTASSIUM 100 MG PO TABS: 100.0000 mg | ORAL_TABLET | Freq: Every evening | ORAL | Status: DC

## 2014-03-12 NOTE — Telephone Encounter (Signed)
New message      OK to call in valsartan 160mg  to [place].  This is a new presc

## 2014-03-12 NOTE — Assessment & Plan Note (Signed)
Her blood pressure is a little elevated today. She'll check the price of Valsartan. Continue same meds for now. May change to valsartan if the price is similar to Losartan.

## 2014-03-12 NOTE — Assessment & Plan Note (Signed)
Check fasting labs today. 

## 2014-03-12 NOTE — Patient Instructions (Addendum)
Check the price of Valsartan 160 mg or 320 mg  Your physician recommends that you have lab work:  Caitlin Walker, lipid,liver  Your physician wants you to follow-up in: 6 months with Dr. Acie Fredrickson.  You will receive a reminder letter in the mail two months in advance. If you don't receive a letter, please call our office to schedule the follow-up appointment.

## 2014-03-12 NOTE — Progress Notes (Signed)
Caitlin Walker Date of Birth  12/12/1945 [facility] [address]    [place]   [place], [place]  [address]    [place], [place]  [address] [phone]  Fax  [phone]  [phone]  Fax [phone]   Problem list: 1. Hypertension 2. Insomnia  History of Present Illness:  Caitlin Walker is a 68 year old female with a history of hypertension. She has done very well. She's not having episodes of chest pain or shortness of breath. She avoids eating any extra salt.  She exercises several times a week.  She is having trouble with insomnia.  May 12, 2012: She has been doing well from a cardiac standpoint.  She has had some lightheadness.  She also has had some leg cramps.  She has not been exercising.  She has been watching her salt intake.   She still works full time in the [employer].  Oct. 1, 2014:  Caitlin Walker is doing ok.  Still has leg cramps.  She has been busy - her was diagnosed with breast cancer.  She is running around lots .  No CP or dyspnea.  Has occasional indigestion.   She works in the [employer].    Nov. 12, 2014 She is doing ok  Sep 18, 2013:  Nov. 9, 2015:  Caitlin Walker is doing well.   She has retired.   Has developed a neuroma on the bottom of her foot.  BP has been a little higher.   Current Outpatient Prescriptions on File Prior to Visit  Medication Sig Dispense Refill  . aspirin 81 MG tablet Take 81 mg by mouth every evening.     . calcium carbonate (OS-CAL) 600 MG TABS Take 600 mg by mouth daily with breakfast.     . hydrochlorothiazide (MICROZIDE) 12.5 MG capsule TAKE 2 CAPSULES BY MOUTH EVERY MORNING. 180 capsule 0  . losartan (COZAAR) 100 MG tablet Take 1 tablet (100 mg total) by mouth every evening. 90 tablet 1  . magnesium oxide (MAG-OX) 400 MG tablet Take 400 mg by mouth every evening.     . potassium chloride (K-DUR,KLOR-CON) 10 MEQ tablet TAKE 1 TABLET BY MOUTH ONCE DAILY 90 tablet 1  . zolpidem (AMBIEN  CR) 12.5 MG CR tablet Take 1 tablet (12.5 mg total) by mouth at bedtime as needed for sleep. 30 tablet 1   No current facility-administered medications on file prior to visit.    Allergies  Allergen Reactions  . Prednisone Swelling  . Bactrim Rash  . Flomax [Tamsulosin Hcl] Rash  . Sulfa Antibiotics Rash    Past Medical History  Diagnosis Date  . Hypertension   . Ureterocele     LEFT  . Chronic leukopenia DX  2011    MILD--  HEMATOLOGIST  --  DR Beryle Beams  . Hyperlipidemia   . History of colon polyps     BENIGN  . Hematuria   . Wears contact lenses   . H/O hiatal hernia     TINY PER EGD 2007  . Hemorrhoids     INTERNAL AND EXTERNAL     Past Surgical History  Procedure Laterality Date  . Colonoscopy N/A 08/01/2012    Procedure: COLONOSCOPY;  Surgeon: Jeryl Columbia, MD;  Location: [facility];  Service: Endoscopy;  Laterality: N/A;  pt. would like dan to do procedure  . Supracervical abdominal hysterectomy  02-25-2000    W/   BILATERAL SALPINGOOPHORECTOMY AND RECTOCELE  REPAIR  . Cysto/  urethral dilation /  right retrograde pyelogram/ right ureteroscopy/  instillation therapy  06-18-2008  . Dilation and curettage of uterus  2001  . Appendectomy  1989  . Cystoscopy/retrograde/ureteroscopy Left 10/26/2013    Procedure: CYSTOSCOPY/RETROGRADE/DIAGNOSTIC DIGITAL URETEROSCOPY WITH LEFT URETERAL STENT PLACEMENT.;  Surgeon: Alexis Frock, MD;  Location: [facility];  Service: Urology;  Laterality: Left;    History  Smoking status  . Never Smoker   Smokeless tobacco  . Never Used    History  Alcohol Use No    Family History  Problem Relation Age of Onset  . Heart disease Father   . Hypertension Father   . Hypertension Sister   . Hyperlipidemia Sister   . Hypertension Brother   . Hypertension Sister   . Colon cancer Sister   . Hypertension Sister   . Hypertension Sister   . Hypertension Sister   . Hyperlipidemia Sister   . Hypertension Brother    . Hypertension Brother   . Hyperlipidemia Brother     Reviw of Systems:  Reviewed in the HPI.  All other systems are negative.  Physical Exam: BP 140/90 mmHg  Pulse 56  Ht 5' 4.5" (1.638 m)  Wt 161 lb 6.4 oz (73.211 kg)  BMI 27.29 kg/m2 The patient is alert and oriented x 3.  The mood and affect are normal.   Skin: warm and dry.  Color is normal.    HEENT:   Normocephalic/atraumatic. Her carotids are normal. There is no JVD.  Lungs: Lungs are clear.   Heart: Regular S1-S2.    Abdomen: Good bowel sounds. No hepatosplenomegaly.  Extremities:  No clubbing cyanosis or edema.  Neuro:  There exam is nonfocal. Her gait is normal. Cranial nerves are intact.    ECG: Nov. 9, 2015:  Sinus brady.  HR 56 .  Otherwise normal  Assessment / Plan:

## 2014-03-12 NOTE — Telephone Encounter (Signed)
So is the Valsartan 160mg  daily? The note didn't really specify.

## 2014-03-13 MED ORDER — VALSARTAN 160 MG PO TABS: 160.0000 mg | ORAL_TABLET | Freq: Every day | ORAL | Status: DC

## 2014-03-13 NOTE — Telephone Encounter (Signed)
I called and notified [facility] Caitlin Walker) of the change in patient's medication.  Per Dr. Acie Fredrickson, Losartan has been discontinued and a new Rx for Valsartan 160 mg has been sent.   Judene Companion Pharmacist verified the receipt of the new Rx and will dispense Valsartan to the patient

## 2014-06-27 ENCOUNTER — Telehealth: Payer: Self-pay | Admitting: Cardiovascular Disease

## 2014-06-27 NOTE — Telephone Encounter (Signed)
New message    Pt c/o medication issue:  1. Name of Medication: valsartan  160 mg   2. How are you currently taking this medication (dosage and times per day)? Pm only    3. Are you having a reaction (difficulty breathing--STAT)? Legs feeling funny , change in the way she feel . B/p yesterday  172/95 , this am  140/93   4. What is your medication issue? Can she be switch back to losartan.

## 2014-06-27 NOTE — Telephone Encounter (Signed)
I spoke with the pt and in her chart Dr Acie Fredrickson switched her from Losartan to Valsartan in November because the pt's BP was not controlled.  The pt said that she did not start the Valsartan until the beginning of this month. The pt states she has not felt well while taking this medication and her BP has been higher on this medicine. The pt is going to stop Valsartan and tonight she plans on taking Losartan 100mg .  The pt would like Dr Acie Fredrickson to review her chart and make further recommendations in regards to her medications.

## 2014-06-27 NOTE — Telephone Encounter (Signed)
I cannot tell if she was not feeling well because her BP was up.  We can increase her dose up to 320 if needed or it's ok with me if she would rather go back to Losartan.

## 2014-06-28 MED ORDER — LOSARTAN POTASSIUM 100 MG PO TABS: 100.0000 mg | ORAL_TABLET | Freq: Every day | ORAL | Status: DC

## 2014-06-28 NOTE — Telephone Encounter (Signed)
Called and spoke with patient who states she would like to switch back from Valsartan to Losartan.  Patient states the Valsartan is making her legs feel strange and she did not have any problems while taking Losartan.  I advised her that Dr. Acie Fredrickson agrees that she can switch back, however the change was made due to elevated blood pressure so she should continue to monitor BP and call back if BP is elevated.  Patient verbalized understanding and agreement and her 6 month follow-up appointment was made while we were on the phone.

## 2014-06-29 ENCOUNTER — Telehealth: Payer: Self-pay | Admitting: Cardiovascular Disease

## 2014-06-29 NOTE — Telephone Encounter (Signed)
New message      Pt c/o medication issue:  1. Name of Medication: diovan/losartan 2. How are you currently taking this medication (dosage and times per day)? Losartan 100mg  daily 3. Are you having a reaction (difficulty breathing--STAT)? Not sure  4. What is your medication issue?  bp is still going up----do not feel right---took last pill (diovan) tues.  Started losartan Wednesday.  Today, pt is seeing "wavy" lines when she looks towards light but she feel ok ---bp 150/85. Could she be having a reaction from the medication?

## 2014-06-29 NOTE — Telephone Encounter (Signed)
lmptcb on home # to go over recommendations from Dr. Acie Fredrickson to add amlodipine 5 mg daily and to monitor bp regulary.

## 2014-06-29 NOTE — Telephone Encounter (Signed)
Add amlodipine 5 mg a day Continue to take BP regularly

## 2014-06-29 NOTE — Telephone Encounter (Signed)
Emmaline Life, RN at 06/28/2014 2:23 PM     Status: Signed       Expand All Collapse All   Called and spoke with patient who states she would like to switch back from Valsartan to Losartan. Patient states the Valsartan is making her legs feel strange and she did not have any problems while taking Losartan. I advised her that Dr. Acie Fredrickson agrees that she can switch back, however the change was made due to elevated blood pressure so she should continue to monitor BP and call back if BP is elevated. Patient verbalized understanding and agreement and her 6 month follow-up appointment was made while we were on the phone.        Looking at previous note this week, patient's blood pressure is back up while taking Losartan. BP 150/85 and patient is seeing wavy lines when she looks towards light. Will forward to Dr. Acie Fredrickson for further instructions.

## 2014-06-29 NOTE — Telephone Encounter (Signed)
I reached pt at her work # which I then advised pt per Dr. Acie Fredrickson add amlodipine 5 mg daily. Pt asked if that was norvasc, I said yes. Pt stated she cannot take norvasc due to leg edema. Pt also said she will try to stick it out with the valsartan. I stated I will let Dr. Acie Fredrickson know of the norvasc and the leg edema and said that someone should probably call her back Monday with recommendation from Dr. Acie Fredrickson. Pt said ok and thank you.

## 2014-07-02 ENCOUNTER — Telehealth: Payer: Self-pay | Admitting: Cardiovascular Disease

## 2014-07-02 ENCOUNTER — Other Ambulatory Visit: Payer: Self-pay | Admitting: *Deleted

## 2014-07-02 MED ORDER — LOSARTAN POTASSIUM 100 MG PO TABS: 100.0000 mg | ORAL_TABLET | Freq: Every day | ORAL | Status: DC

## 2014-07-02 NOTE — Telephone Encounter (Signed)
Spoke with pt in regards to Dr. Elmarie Shiley recommendations. Informed pt to continue to monitor her BP's and to call our office in about a week with her readings. Reminded pt of appt on 09/17/14 and informed pt that if she needed to she could be seen by Dr. Acie Fredrickson sooner. Pt verbalized understanding and was in agreement with this plan.

## 2014-07-02 NOTE — Telephone Encounter (Signed)
Have her continue to record her BP. I note that she is intolerant to amlodipine  I can see her sooner if needed.

## 2014-07-02 NOTE — Telephone Encounter (Signed)
Pt states that she spoke with Sharyn Lull last week who advised her that Dr. Acie Fredrickson said it was ok to start taking the Losartan 100mg  daily again and stop the Diovan. Pt states that yesterday, Sunday, she having slightly blurred vision and was seeing spots. Pt made appt to see eye doctor Tuesday but wanted to call our office and make [place] aware because she knows that BP meds can cause these symptoms. Pt states that she does not have any other symptoms- no chest pain, no dizziness, no lightheadedness. Previous message states that Dr. Acie Fredrickson wanted pt to start taking Amlodipine 5mg  daily but pt stated that she could not take that medication due to leg edema. Informed pt that I would forward this information to Dr. Acie Fredrickson for review and advisement. Pt verbalized understanding and was in agreement with this plan.

## 2014-07-02 NOTE — Telephone Encounter (Signed)
Pt states that she spoke with Sharyn Lull

## 2014-07-02 NOTE — Telephone Encounter (Signed)
New Msg       Pt c/o medication issue:  1. Name of Medication: Diovan  2. How are you currently taking this medication (dosage and times per day)? 160 mg once daily  3. Are you having a reaction (difficulty breathing--STAT)? Blurred vision and seeing spots  4. What is your medication issue? Pt states a prescription for Losartin 100 mg was supposed to be written for her. Pt states prescription isn't avail as of today.  Please return pt call.

## 2014-07-09 ENCOUNTER — Telehealth: Payer: Self-pay | Admitting: Cardiovascular Disease

## 2014-07-09 ENCOUNTER — Telehealth: Payer: Self-pay

## 2014-07-09 NOTE — Telephone Encounter (Signed)
These look ok The diastolic remains a bit elevated.  Continue current meds ( she does not tolerate many medications )  Will address at her office visit.

## 2014-07-09 NOTE — Telephone Encounter (Signed)
Pt aware MD reviewed Blood Pressures.  No questions at this time.

## 2014-07-09 NOTE — Telephone Encounter (Signed)
Pt called with below BP readings after medication change on 2/29.

## 2014-07-09 NOTE — Telephone Encounter (Signed)
New Msg           Pt c/o BP issue:  1. What are your last 5 BP readings? 133/96 127/90  119/87 125/91  134/96 2. Are you having any other symptoms (ex. Dizziness, headache, blurred vision, passed out)? No 3. What is your medication issue? none Pt saw dark spots with BP of 133/96 and has found out there is a hole is retina which is being corrected tomorrow morning.   Surgery completed by Dr. Tempie Hoist at [facility].   Please call if any concerns with readings.

## 2014-07-10 ENCOUNTER — Encounter (INDEPENDENT_AMBULATORY_CARE_PROVIDER_SITE_OTHER): Payer: Commercial Managed Care - HMO | Admitting: Ophthalmology

## 2014-07-10 DIAGNOSIS — H2513 Age-related nuclear cataract, bilateral: Secondary | ICD-10-CM | POA: Diagnosis not present

## 2014-07-10 DIAGNOSIS — I10 Essential (primary) hypertension: Secondary | ICD-10-CM

## 2014-07-10 DIAGNOSIS — H33311 Horseshoe tear of retina without detachment, right eye: Secondary | ICD-10-CM

## 2014-07-10 DIAGNOSIS — H43813 Vitreous degeneration, bilateral: Secondary | ICD-10-CM

## 2014-07-10 DIAGNOSIS — H35033 Hypertensive retinopathy, bilateral: Secondary | ICD-10-CM | POA: Diagnosis not present

## 2014-07-11 NOTE — Telephone Encounter (Signed)
Left message for patient to call me at the office to report blood pressure readings

## 2014-07-11 NOTE — Telephone Encounter (Signed)
Spoke with patient who states she is feeling well and denies complaints.  Patient reports BP has been: 120/89 last night 129/90 today She states her BP is higher in her right arm than in her left.  Reports left arm BP today was 113/83.   Patient currently takes Losartan 100 mg and HCTZ 25 mg.  I advised patient I will forward to Dr. Acie Fredrickson for review and will let her know if he has any additional treatment recommendations.  I advised patient to call back sooner with questions or concerns.  Patient verbalized understanding and agreement.

## 2014-07-11 NOTE — Telephone Encounter (Signed)
These BP readings look ok

## 2014-07-17 ENCOUNTER — Ambulatory Visit (INDEPENDENT_AMBULATORY_CARE_PROVIDER_SITE_OTHER): Payer: Commercial Managed Care - HMO | Admitting: Ophthalmology

## 2014-07-17 DIAGNOSIS — H33301 Unspecified retinal break, right eye: Secondary | ICD-10-CM

## 2014-08-04 ENCOUNTER — Other Ambulatory Visit: Payer: Self-pay | Admitting: Cardiovascular Disease

## 2014-09-03 ENCOUNTER — Other Ambulatory Visit: Payer: Self-pay

## 2014-09-03 DIAGNOSIS — Z1231 Encounter for screening mammogram for malignant neoplasm of breast: Secondary | ICD-10-CM

## 2014-09-17 ENCOUNTER — Ambulatory Visit (INDEPENDENT_AMBULATORY_CARE_PROVIDER_SITE_OTHER): Payer: Commercial Managed Care - HMO | Admitting: Cardiovascular Disease

## 2014-09-17 ENCOUNTER — Encounter: Payer: Self-pay | Admitting: Cardiovascular Disease

## 2014-09-17 VITALS — BP 122/84 | HR 64 | Ht 64.5 in | Wt 155.4 lb

## 2014-09-17 DIAGNOSIS — I10 Essential (primary) hypertension: Secondary | ICD-10-CM

## 2014-09-17 DIAGNOSIS — E785 Hyperlipidemia, unspecified: Secondary | ICD-10-CM

## 2014-09-17 NOTE — Patient Instructions (Signed)
Medication Instructions:  Your physician recommends that you continue on your current medications as directed. Please refer to the Current Medication list given to you today.   Labwork: Your physician recommends that you return for lab work in: 6 months on the day of or a few days before your office visit with Dr. Nahser.  You will need to FAST for this appointment - nothing to eat or drink after midnight the night before except water.    Testing/Procedures: None Ordered   Follow-Up: Your physician wants you to follow-up in: 6 months with Dr. Nahser.  You will receive a reminder letter in the mail two months in advance. If you don't receive a letter, please call our office to schedule the follow-up appointment.    

## 2014-09-17 NOTE — Progress Notes (Signed)
Cardiology Office Note   Date:  09/17/2014   ID:  CHRISTMAS FARACI, DOB [birth year], MRN [id number]  PCP:   Melinda Crutch, MD  Cardiologist:   Acie Fredrickson Wonda Cheng, MD   Chief Complaint  Patient presents with  . Hypertension   1. Hypertension 2. Insomnia  History of Present Illness:  Caitlin Walker is a 70 year old female with a history of hypertension. She has done very well. She's not having episodes of chest pain or shortness of breath. She avoids eating any extra salt. She exercises several times a week.  She is having trouble with insomnia.  May 12, 2012: She has been doing well from a cardiac standpoint. She has had some lightheadness. She also has had some leg cramps. She has not been exercising. She has been watching her salt intake. She still works full time in the [place].  Oct. 1, 2014:  Kieren is doing ok. Still has leg cramps. She has been busy - her was diagnosed with breast cancer. She is running around lots . No CP or dyspnea. Has occasional indigestion. She works in the [place].   Nov. 12, 2014 She is doing ok  Sep 18, 2013:  Nov. 9, 2015:  Tashawn is doing well.  She has retired. Has developed a neuroma on the bottom of her foot. BP has been a little higher.   Sep 17, 2014:   Caitlin Walker is a 69 y.o. female who presents for HTN She is doing well. No CP,  BP has been well controlled.    Past Medical History  Diagnosis Date  . Hypertension   . Ureterocele     LEFT  . Chronic leukopenia DX  2011    MILD--  HEMATOLOGIST  --  DR Beryle Beams  . Hyperlipidemia   . History of colon polyps     BENIGN  . Hematuria   . Wears contact lenses   . H/O hiatal hernia     TINY PER EGD 2007  . Hemorrhoids     INTERNAL AND EXTERNAL     Past Surgical History  Procedure Laterality Date  . Colonoscopy N/A 08/01/2012    Procedure: COLONOSCOPY;  Surgeon: Jeryl Columbia, MD;  Location: [facility];  Service: Endoscopy;  Laterality: N/A;  pt.  would like dan to do procedure  . Supracervical abdominal hysterectomy  02-25-2000    W/   BILATERAL SALPINGOOPHORECTOMY AND RECTOCELE REPAIR  . Cysto/  urethral dilation /  right retrograde pyelogram/ right ureteroscopy/  instillation therapy  06-18-2008  . Dilation and curettage of uterus  2001  . Appendectomy  1989  . Cystoscopy/retrograde/ureteroscopy Left 10/26/2013    Procedure: CYSTOSCOPY/RETROGRADE/DIAGNOSTIC DIGITAL URETEROSCOPY WITH LEFT URETERAL STENT PLACEMENT.;  Surgeon: Alexis Frock, MD;  Location: [facility];  Service: Urology;  Laterality: Left;     Current Outpatient Prescriptions  Medication Sig Dispense Refill  . aspirin 81 MG tablet Take 81 mg by mouth every evening.     . calcium carbonate (OS-CAL) 600 MG TABS Take 600 mg by mouth daily with breakfast.     . hydrochlorothiazide (MICROZIDE) 12.5 MG capsule TAKE 2 CAPSULES BY MOUTH EVERY MORNING. 180 capsule 0  . losartan (COZAAR) 100 MG tablet Take 1 tablet (100 mg total) by mouth daily. 30 tablet 6  . magnesium oxide (MAG-OX) 400 MG tablet Take 400 mg by mouth every evening.     . potassium chloride (K-DUR,KLOR-CON) 10 MEQ tablet TAKE 1 TABLET EVERY DAY 90 tablet 3  .  zolpidem (AMBIEN CR) 12.5 MG CR tablet Take 1 tablet (12.5 mg total) by mouth at bedtime as needed for sleep. 30 tablet 1   No current facility-administered medications for this visit.    Allergies:   Prednisone; Bactrim; Flomax; and Sulfa antibiotics    Social History:  The patient  reports that she has never smoked. She has never used smokeless tobacco. She reports that she does not drink alcohol or use illicit drugs.   Family History:  The patient's family history includes Colon cancer in her sister; Heart disease in her father; Hyperlipidemia in her brother, sister, and sister; Hypertension in her brother, brother, brother, father, sister, sister, sister, sister, and sister.    ROS:  Please see the history of present illness.     Review of Systems: Constitutional:  denies fever, chills, diaphoresis, appetite change and fatigue.  HEENT: denies photophobia, eye pain, redness, hearing loss, ear pain, congestion, sore throat, rhinorrhea, sneezing, neck pain, neck stiffness and tinnitus.  Respiratory: denies SOB, DOE, cough, chest tightness, and wheezing.  Cardiovascular: denies chest pain, palpitations and leg swelling.  Gastrointestinal: denies nausea, vomiting, abdominal pain, diarrhea, constipation, blood in stool.  Genitourinary: denies dysuria, urgency, frequency, hematuria, flank pain and difficulty urinating.  Musculoskeletal: denies  myalgias, back pain, joint swelling, arthralgias and gait problem.   Skin: denies pallor, rash and wound.  Neurological: denies dizziness, seizures, syncope, weakness, light-headedness, numbness and headaches.   Hematological: denies adenopathy, easy bruising, personal or family bleeding history.  Psychiatric/ Behavioral: denies suicidal ideation, mood changes, confusion, nervousness, sleep disturbance and agitation.       All other systems are reviewed and negative.    PHYSICAL EXAM: VS:  BP 122/84 mmHg  Pulse 64  Ht 5' 4.5" (1.638 m)  Wt 70.489 kg (155 lb 6.4 oz)  BMI 26.27 kg/m2 , BMI Body mass index is 26.27 kg/(m^2). GEN: Well nourished, well developed, in no acute distress HEENT: normal Neck: no JVD, carotid bruits, or masses Cardiac: RRR; no murmurs, rubs, or gallops,no edema  Respiratory:  clear to auscultation bilaterally, normal work of breathing GI: soft, nontender, nondistended, + BS MS: no deformity or atrophy Skin: warm and dry, no rash Neuro:  Strength and sensation are intact Psych: normal   EKG:  EKG is not ordered today.    Recent Labs: 10/26/2013: Hemoglobin 13.3 03/12/2014: ALT 14; BUN 12; Creatinine 0.6; Potassium 3.8; Sodium 139    Lipid Panel    Component Value Date/Time   CHOL 189 03/12/2014 1215   TRIG 125.0 03/12/2014 1215   HDL  74.20 03/12/2014 1215   CHOLHDL 3 03/12/2014 1215   VLDL 25.0 03/12/2014 1215   LDLCALC 90 03/12/2014 1215      Wt Readings from Last 3 Encounters:  09/17/14 70.489 kg (155 lb 6.4 oz)  03/12/14 73.211 kg (161 lb 6.4 oz)  10/26/13 71.442 kg (157 lb 8 oz)      Other studies Reviewed: Additional studies/ records that were reviewed today include: . Review of the above records demonstrates:    ASSESSMENT AND PLAN:  1.  Essential hypertension: Cicely is doing very well. Her blood pressure is very well-controlled. Continue current medications. She has been exercising on a regular basis. She's also been watching her diet. I'll see her again in 6 months.    Current medicines are reviewed at length with the patient today.  The patient does not have concerns regarding medicines.  The following changes have been made:  no change  Labs/ tests  ordered today include: No orders of the defined types were placed in this encounter.     Disposition:   FU with me in 6 months       , Wonda Cheng, MD  09/17/2014 11:10 AM    [facility] [place], [place], [place]  [address] Phone: [phone]; Fax: [phone]   [facility]  [address] [place], [place]  [address] [phone]    Fax [phone]

## 2014-10-08 ENCOUNTER — Ambulatory Visit
Admission: RE | Admit: 2014-10-08 | Discharge: 2014-10-08 | Disposition: A | Discharge status: Home / Self Care | Payer: Commercial Managed Care - HMO | Source: Ambulatory Visit

## 2014-10-08 DIAGNOSIS — Z1231 Encounter for screening mammogram for malignant neoplasm of breast: Secondary | ICD-10-CM

## 2014-10-10 ENCOUNTER — Other Ambulatory Visit: Payer: Self-pay | Admitting: Family Medicine

## 2014-10-10 DIAGNOSIS — R928 Other abnormal and inconclusive findings on diagnostic imaging of breast: Secondary | ICD-10-CM

## 2014-10-16 ENCOUNTER — Ambulatory Visit
Admission: RE | Admit: 2014-10-16 | Discharge: 2014-10-16 | Disposition: A | Discharge status: Home / Self Care | Payer: Commercial Managed Care - HMO | Source: Ambulatory Visit | Attending: Family Medicine | Admitting: Family Medicine

## 2014-10-16 DIAGNOSIS — R928 Other abnormal and inconclusive findings on diagnostic imaging of breast: Secondary | ICD-10-CM

## 2014-11-16 ENCOUNTER — Ambulatory Visit (INDEPENDENT_AMBULATORY_CARE_PROVIDER_SITE_OTHER): Payer: Commercial Managed Care - HMO | Admitting: Ophthalmology

## 2014-11-16 DIAGNOSIS — H33301 Unspecified retinal break, right eye: Secondary | ICD-10-CM

## 2014-11-16 DIAGNOSIS — I10 Essential (primary) hypertension: Secondary | ICD-10-CM | POA: Diagnosis not present

## 2014-11-16 DIAGNOSIS — H35033 Hypertensive retinopathy, bilateral: Secondary | ICD-10-CM | POA: Diagnosis not present

## 2014-11-16 DIAGNOSIS — H43813 Vitreous degeneration, bilateral: Secondary | ICD-10-CM | POA: Diagnosis not present

## 2015-02-26 ENCOUNTER — Other Ambulatory Visit: Payer: Self-pay | Admitting: Gastroenterology

## 2015-02-26 DIAGNOSIS — R1084 Generalized abdominal pain: Secondary | ICD-10-CM

## 2015-02-26 DIAGNOSIS — K921 Melena: Secondary | ICD-10-CM

## 2015-02-28 ENCOUNTER — Ambulatory Visit
Admission: RE | Admit: 2015-02-28 | Discharge: 2015-02-28 | Disposition: A | Discharge status: Home / Self Care | Payer: Commercial Managed Care - HMO | Source: Ambulatory Visit | Attending: Gastroenterology | Admitting: Gastroenterology

## 2015-02-28 DIAGNOSIS — K921 Melena: Secondary | ICD-10-CM

## 2015-02-28 DIAGNOSIS — R1084 Generalized abdominal pain: Secondary | ICD-10-CM

## 2015-02-28 MED ORDER — IOPAMIDOL (ISOVUE-300) INJECTION 61%
100.0000 mL | Freq: Once | INTRAVENOUS | Status: AC | PRN
  Administered 2015-02-28: 100 mL via INTRAVENOUS

## 2015-03-15 ENCOUNTER — Other Ambulatory Visit (INDEPENDENT_AMBULATORY_CARE_PROVIDER_SITE_OTHER): Payer: Commercial Managed Care - HMO | Admitting: *Deleted

## 2015-03-15 DIAGNOSIS — E785 Hyperlipidemia, unspecified: Secondary | ICD-10-CM | POA: Diagnosis not present

## 2015-03-15 LAB — HEPATIC FUNCTION PANEL
ALT: 21 U/L (ref 6–29)
AST: 26 U/L (ref 10–35)
Albumin: 4.1 g/dL (ref 3.6–5.1)
Alkaline Phosphatase: 80 U/L (ref 33–130)
Bilirubin, Direct: 0.1 mg/dL
Indirect Bilirubin: 0.3 mg/dL (ref 0.2–1.2)
Total Bilirubin: 0.4 mg/dL (ref 0.2–1.2)
Total Protein: 7 g/dL (ref 6.1–8.1)

## 2015-03-15 LAB — BASIC METABOLIC PANEL WITH GFR
BUN: 15 mg/dL (ref 7–25)
CO2: 28 mmol/L (ref 20–31)
Calcium: 9.9 mg/dL (ref 8.6–10.4)
Chloride: 101 mmol/L (ref 98–110)
Creat: 0.69 mg/dL (ref 0.50–0.99)
Glucose, Bld: 84 mg/dL (ref 65–99)
Potassium: 3.9 mmol/L (ref 3.5–5.3)
Sodium: 138 mmol/L (ref 135–146)

## 2015-03-15 LAB — LIPID PANEL
Cholesterol: 187 mg/dL (ref 125–200)
HDL: 89 mg/dL
LDL Cholesterol: 81 mg/dL
Total CHOL/HDL Ratio: 2.1 ratio
Triglycerides: 83 mg/dL
VLDL: 17 mg/dL

## 2015-03-15 NOTE — Addendum Note (Signed)
Addended by: Eulis Foster on: 03/15/2015 08:09 AM   Modules accepted: Orders

## 2015-03-15 NOTE — Addendum Note (Signed)
Addended by: Eulis Foster on: 03/15/2015 08:10 AM   Modules accepted: Orders

## 2015-03-15 NOTE — Addendum Note (Signed)
Addended by: Eulis Foster on: 03/15/2015 12:19 PM   Modules accepted: Orders

## 2015-03-18 ENCOUNTER — Encounter: Payer: Self-pay | Admitting: Cardiovascular Disease

## 2015-03-18 ENCOUNTER — Ambulatory Visit (INDEPENDENT_AMBULATORY_CARE_PROVIDER_SITE_OTHER): Payer: Commercial Managed Care - HMO | Admitting: Cardiovascular Disease

## 2015-03-18 VITALS — BP 120/80 | HR 63 | Ht 64.5 in | Wt 156.8 lb

## 2015-03-18 DIAGNOSIS — I10 Essential (primary) hypertension: Secondary | ICD-10-CM

## 2015-03-18 NOTE — Patient Instructions (Signed)
Medication Instructions:  Your physician recommends that you continue on your current medications as directed. Please refer to the Current Medication list given to you today.   Labwork: None Ordered   Testing/Procedures: None Ordered   Follow-Up: Your physician wants you to follow-up in: 1 year with Dr. Nahser.  You will receive a reminder letter in the mail two months in advance. If you don't receive a letter, please call our office to schedule the follow-up appointment.   If you need a refill on your cardiac medications before your next appointment, please call your pharmacy.   Thank you for choosing [facility]!  , RN [phone]    

## 2015-03-18 NOTE — Progress Notes (Signed)
Cardiology Office Note   Date:  03/18/2015   ID:  Caitlin Walker, DOB 1945/12/20, MRN [id number]  PCP:   Caitlin Crutch, MD  Cardiologist:   Caitlin Fredrickson Wonda Cheng, MD   Chief Complaint  Patient presents with  . Follow-up    htn   1. Hypertension 2. Insomnia  History of Present Illness:  Caitlin Walker is a 69 year old female with a history of hypertension. She has done very well. She's not having episodes of chest pain or shortness of breath. She avoids eating any extra salt. She exercises several times a week.  She is having trouble with insomnia.  May 12, 2012: She has been doing well from a cardiac standpoint. She has had some lightheadness. She also has had some leg cramps. She has not been exercising. She has been watching her salt intake. She still works full time in the [place].  Oct. 1, 2014:  Caitlin Walker is doing ok. Still has leg cramps. She has been busy - her was diagnosed with breast cancer. She is running around lots . No CP or dyspnea. Has occasional indigestion. She works in the [employer].   Nov. 12, 2014 She is doing ok  Sep 18, 2013:  Nov. 9, 2015:  Caitlin Walker is doing well.  She has retired. Has developed a neuroma on the bottom of her foot. BP has been a little higher.   Sep 17, 2014:   [name] is a 69 y.o. female who presents for HTN She is doing well. No CP,  BP has been well controlled.   Nov. 14, 2016:  Doing well.  Has had some GI bleeding - has not found the source yet.  Has had upper ECG.  CT , barium swallow .  Lipids have improved significantly   Past Medical History  Diagnosis Date  . Hypertension   . Ureterocele     LEFT  . Chronic leukopenia DX  2011    MILD--  HEMATOLOGIST  --  DR Beryle Beams  . Hyperlipidemia   . History of colon polyps     BENIGN  . Hematuria   . Wears contact lenses   . H/O hiatal hernia     TINY PER EGD 2007  . Hemorrhoids     INTERNAL AND EXTERNAL     Past Surgical History    Procedure Laterality Date  . Colonoscopy N/A 08/01/2012    Procedure: COLONOSCOPY;  Surgeon: Jeryl Columbia, MD;  Location: [facility];  Service: Endoscopy;  Laterality: N/A;  pt. would like dan to do procedure  . Supracervical abdominal hysterectomy  02-25-2000    W/   BILATERAL SALPINGOOPHORECTOMY AND RECTOCELE REPAIR  . Cysto/  urethral dilation /  right retrograde pyelogram/ right ureteroscopy/  instillation therapy  06-18-2008  . Dilation and curettage of uterus  2001  . Appendectomy  1989  . Cystoscopy/retrograde/ureteroscopy Left 10/26/2013    Procedure: CYSTOSCOPY/RETROGRADE/DIAGNOSTIC DIGITAL URETEROSCOPY WITH LEFT URETERAL STENT PLACEMENT.;  Surgeon: Alexis Frock, MD;  Location: [facility];  Service: Urology;  Laterality: Left;     Current Outpatient Prescriptions  Medication Sig Dispense Refill  . aspirin 81 MG tablet Take 81 mg by mouth every evening.     . calcium carbonate (OS-CAL) 600 MG TABS Take 600 mg by mouth daily with breakfast.     . hydrochlorothiazide (MICROZIDE) 12.5 MG capsule TAKE 2 CAPSULES BY MOUTH EVERY MORNING. 180 capsule 0  . losartan (COZAAR) 100 MG tablet Take 1 tablet (100 mg total) by  mouth daily. 30 tablet 6  . magnesium oxide (MAG-OX) 400 MG tablet Take 400 mg by mouth every evening.     . potassium chloride (K-DUR,KLOR-CON) 10 MEQ tablet TAKE 1 TABLET EVERY DAY 90 tablet 3  . zolpidem (AMBIEN CR) 12.5 MG CR tablet Take 1 tablet (12.5 mg total) by mouth at bedtime as needed for sleep. 30 tablet 1   No current facility-administered medications for this visit.    Allergies:   Prednisone; Bactrim; Flomax; and Sulfa antibiotics    Social History:  The patient  reports that she has never smoked. She has never used smokeless tobacco. She reports that she does not drink alcohol or use illicit drugs.   Family History:  The patient's family history includes Colon cancer in her sister; Heart disease in her father; Hyperlipidemia in her  brother, sister, and sister; Hypertension in her brother, brother, brother, father, sister, sister, sister, sister, and sister.    ROS:  Please see the history of present illness.    Review of Systems: Constitutional:  denies fever, chills, diaphoresis, appetite change and fatigue.  HEENT: denies photophobia, eye pain, redness, hearing loss, ear pain, congestion, sore throat, rhinorrhea, sneezing, neck pain, neck stiffness and tinnitus.  Respiratory: denies SOB, DOE, cough, chest tightness, and wheezing.  Cardiovascular: denies chest pain, palpitations and leg swelling.  Gastrointestinal: denies nausea, vomiting, abdominal pain, diarrhea, constipation, blood in stool.  Genitourinary: denies dysuria, urgency, frequency, hematuria, flank pain and difficulty urinating.  Musculoskeletal: denies  myalgias, back pain, joint swelling, arthralgias and gait problem.   Skin: denies pallor, rash and wound.  Neurological: denies dizziness, seizures, syncope, weakness, light-headedness, numbness and headaches.   Hematological: denies adenopathy, easy bruising, personal or family bleeding history.  Psychiatric/ Behavioral: denies suicidal ideation, mood changes, confusion, nervousness, sleep disturbance and agitation.       All other systems are reviewed and negative.    PHYSICAL EXAM: VS:  BP 120/80 mmHg  Pulse 63  Ht 5' 4.5" (1.638 m)  Wt 156 lb 12.8 oz (71.124 kg)  BMI 26.51 kg/m2  SpO2 99% , BMI Body mass index is 26.51 kg/(m^2). GEN: Well nourished, well developed, in no acute distress HEENT: normal Neck: no JVD, carotid bruits, or masses Cardiac: RRR; no murmurs, rubs, or gallops,no edema  Respiratory:  clear to auscultation bilaterally, normal work of breathing GI: soft, nontender, nondistended, + BS MS: no deformity or atrophy Skin: warm and dry, no rash Neuro:  Strength and sensation are intact Psych: normal   EKG:  EKG is not ordered today.    Recent Labs: 03/15/2015: ALT  21; BUN 15; Creat 0.69; Potassium 3.9; Sodium 138    Lipid Panel    Component Value Date/Time   CHOL 187 03/15/2015 1219   TRIG 83 03/15/2015 1219   HDL 89 03/15/2015 1219   CHOLHDL 2.1 03/15/2015 1219   VLDL 17 03/15/2015 1219   LDLCALC 81 03/15/2015 1219      Wt Readings from Last 3 Encounters:  03/18/15 156 lb 12.8 oz (71.124 kg)  09/17/14 155 lb 6.4 oz (70.489 kg)  03/12/14 161 lb 6.4 oz (73.211 kg)      Other studies Reviewed: Additional studies/ records that were reviewed today include: . Review of the above records demonstrates:    ASSESSMENT AND PLAN:  1.  Essential hypertension: [place] is doing very well. Her blood pressure is very well-controlled. Continue current medications. She has been exercising on a regular basis. She's also been watching her diet. I'll  see her again in 6 months.    Current medicines are reviewed at length with the patient today.  The patient does not have concerns regarding medicines.  The following changes have been made:  no change  Labs/ tests ordered today include: No orders of the defined types were placed in this encounter.     Disposition:   FU with me in 1 year      Nahser, Wonda Cheng, MD  03/18/2015 10:46 AM    [place], [place], [place]  [address] Phone: [phone]; Fax: [phone]   [facility]  [address] [place], [place]  [address] [phone]    Fax [phone]

## 2015-04-16 ENCOUNTER — Other Ambulatory Visit: Payer: Self-pay | Admitting: Cardiovascular Disease

## 2015-05-08 ENCOUNTER — Telehealth: Payer: Self-pay | Admitting: Cardiovascular Disease

## 2015-05-08 ENCOUNTER — Other Ambulatory Visit: Payer: Self-pay | Admitting: Nurse Practitioner

## 2015-05-08 MED ORDER — POTASSIUM CHLORIDE CRYS ER 10 MEQ PO TBCR: 10.0000 meq | EXTENDED_RELEASE_TABLET | Freq: Every day | ORAL | Status: DC

## 2015-05-08 MED ORDER — LOSARTAN POTASSIUM 100 MG PO TABS: 100.0000 mg | ORAL_TABLET | Freq: Every day | ORAL | Status: DC

## 2015-05-08 NOTE — Telephone Encounter (Signed)
Walk in pt form-mail order prescriptions-dropped off by patient gave to Kelby Aline

## 2015-05-09 DIAGNOSIS — K921 Melena: Secondary | ICD-10-CM | POA: Diagnosis not present

## 2015-07-01 DIAGNOSIS — J069 Acute upper respiratory infection, unspecified: Secondary | ICD-10-CM | POA: Diagnosis not present

## 2015-07-08 ENCOUNTER — Other Ambulatory Visit: Payer: Self-pay | Admitting: Gastroenterology

## 2015-07-08 DIAGNOSIS — D122 Benign neoplasm of ascending colon: Secondary | ICD-10-CM | POA: Diagnosis not present

## 2015-07-08 DIAGNOSIS — K5730 Diverticulosis of large intestine without perforation or abscess without bleeding: Secondary | ICD-10-CM | POA: Diagnosis not present

## 2015-07-08 DIAGNOSIS — K921 Melena: Secondary | ICD-10-CM | POA: Diagnosis not present

## 2015-07-12 DIAGNOSIS — R05 Cough: Secondary | ICD-10-CM | POA: Diagnosis not present

## 2015-07-12 DIAGNOSIS — G479 Sleep disorder, unspecified: Secondary | ICD-10-CM | POA: Diagnosis not present

## 2015-07-12 DIAGNOSIS — K922 Gastrointestinal hemorrhage, unspecified: Secondary | ICD-10-CM | POA: Diagnosis not present

## 2015-07-19 DIAGNOSIS — J209 Acute bronchitis, unspecified: Secondary | ICD-10-CM | POA: Diagnosis not present

## 2015-07-19 DIAGNOSIS — R05 Cough: Secondary | ICD-10-CM | POA: Diagnosis not present

## 2015-08-05 DIAGNOSIS — J45909 Unspecified asthma, uncomplicated: Secondary | ICD-10-CM | POA: Diagnosis not present

## 2015-08-12 DIAGNOSIS — H2513 Age-related nuclear cataract, bilateral: Secondary | ICD-10-CM | POA: Diagnosis not present

## 2015-08-12 DIAGNOSIS — H40013 Open angle with borderline findings, low risk, bilateral: Secondary | ICD-10-CM | POA: Diagnosis not present

## 2015-08-12 DIAGNOSIS — H35033 Hypertensive retinopathy, bilateral: Secondary | ICD-10-CM | POA: Diagnosis not present

## 2015-08-12 DIAGNOSIS — H33321 Round hole, right eye: Secondary | ICD-10-CM | POA: Diagnosis not present

## 2015-08-29 DIAGNOSIS — R05 Cough: Secondary | ICD-10-CM | POA: Diagnosis not present

## 2015-09-11 DIAGNOSIS — I10 Essential (primary) hypertension: Secondary | ICD-10-CM | POA: Diagnosis not present

## 2015-09-11 DIAGNOSIS — R05 Cough: Secondary | ICD-10-CM | POA: Diagnosis not present

## 2015-09-11 DIAGNOSIS — R300 Dysuria: Secondary | ICD-10-CM | POA: Diagnosis not present

## 2015-09-23 ENCOUNTER — Other Ambulatory Visit: Payer: Self-pay

## 2015-09-23 DIAGNOSIS — Z1231 Encounter for screening mammogram for malignant neoplasm of breast: Secondary | ICD-10-CM

## 2015-10-01 ENCOUNTER — Encounter: Payer: Self-pay | Admitting: Cardiovascular Disease

## 2015-10-01 ENCOUNTER — Ambulatory Visit (INDEPENDENT_AMBULATORY_CARE_PROVIDER_SITE_OTHER): Payer: PPO | Admitting: Cardiovascular Disease

## 2015-10-01 VITALS — BP 136/90 | HR 63 | Ht 64.5 in | Wt 157.0 lb

## 2015-10-01 DIAGNOSIS — I10 Essential (primary) hypertension: Secondary | ICD-10-CM

## 2015-10-01 MED ORDER — LOSARTAN POTASSIUM 100 MG PO TABS: 100.0000 mg | ORAL_TABLET | Freq: Every day | ORAL | Status: DC

## 2015-10-01 NOTE — Patient Instructions (Signed)
Medication Instructions:  Your physician recommends that you continue on your current medications as directed. Please refer to the Current Medication list given to you today. A refill of your Losartan has been sent to your pharmacy   Labwork: None Ordered   Testing/Procedures: None Ordered   Follow-Up: Your physician wants you to follow-up in: 6 months with Dr. Acie Fredrickson.  You will receive a reminder letter in the mail two months in advance. If you don't receive a letter, please call our office to schedule the follow-up appointment.   If you need a refill on your cardiac medications before your next appointment, please call your pharmacy.   Thank you for choosing [facility]! Christen Bame, RN [phone]

## 2015-10-01 NOTE — Progress Notes (Signed)
Cardiology Office Note   Date:  10/01/2015   ID:  Caitlin Walker, DOB 03-May-1946, MRN [id number]  PCP:   Melinda Crutch, MD  Cardiologist:   Mertie Moores, MD   No chief complaint on file.  1. Hypertension 2. Insomnia  History of Present Illness:  Caitlin Walker is a 70 year old female with a history of hypertension. She has done very well. She's not having episodes of chest pain or shortness of breath. She avoids eating any extra salt. She exercises several times a week.  She is having trouble with insomnia.  May 12, 2012: She has been doing well from a cardiac standpoint. She has had some lightheadness. She also has had some leg cramps. She has not been exercising. She has been watching her salt intake. She still works full time in the [employer].  Oct. 1, 2014:  Caitlin Walker is doing ok. Still has leg cramps. She has been busy - her was diagnosed with breast cancer. She is running around lots . No CP or dyspnea. Has occasional indigestion. She works in the [place].   Nov. 12, 2014 She is doing ok  Sep 18, 2013:  Nov. 9, 2015:  Caitlin Walker is doing well.  She has retired. Has developed a neuroma on the bottom of her foot. BP has been a little higher.   Sep 17, 2014:   Caitlin Walker is a 70 y.o. female who presents for HTN She is doing well. No CP,  BP has been well controlled.   Nov. 14, 2016:  Doing well.  Has had some GI bleeding - has not found the source yet.  Has had upper ECG.  CT , barium swallow .  Lipids have improved significantly   Oct 01, 2015: Caitlin Walker is seen back today for eval of her HTN.  Was diagnosed with Purtussis in Feb.   Still coughing  BP is well controlled.  Remains active   Past Medical History  Diagnosis Date  . Hypertension   . Ureterocele     LEFT  . Chronic leukopenia DX  2011    MILD--  HEMATOLOGIST  --  DR Beryle Beams  . Hyperlipidemia   . History of colon polyps     BENIGN  . Hematuria   . Wears contact lenses    . H/O hiatal hernia     TINY PER EGD 2007  . Hemorrhoids     INTERNAL AND EXTERNAL     Past Surgical History  Procedure Laterality Date  . Colonoscopy N/A 08/01/2012    Procedure: COLONOSCOPY;  Surgeon: Jeryl Columbia, MD;  Location: [facility];  Service: Endoscopy;  Laterality: N/A;  pt. would like dan to do procedure  . Supracervical abdominal hysterectomy  02-25-2000    W/   BILATERAL SALPINGOOPHORECTOMY AND RECTOCELE REPAIR  . Cysto/  urethral dilation /  right retrograde pyelogram/ right ureteroscopy/  instillation therapy  06-18-2008  . Dilation and curettage of uterus  2001  . Appendectomy  1989  . Cystoscopy/retrograde/ureteroscopy Left 10/26/2013    Procedure: CYSTOSCOPY/RETROGRADE/DIAGNOSTIC DIGITAL URETEROSCOPY WITH LEFT URETERAL STENT PLACEMENT.;  Surgeon: Alexis Frock, MD;  Location: [facility];  Service: Urology;  Laterality: Left;     Current Outpatient Prescriptions  Medication Sig Dispense Refill  . aspirin 81 MG tablet Take 81 mg by mouth every evening.     . calcium carbonate (OS-CAL) 600 MG TABS Take 600 mg by mouth daily with breakfast.     . hydrochlorothiazide (MICROZIDE) 12.5 MG capsule  TAKE 2 CAPSULES BY MOUTH EVERY MORNING. 180 capsule 0  . losartan (COZAAR) 100 MG tablet Take 1 tablet (100 mg total) by mouth daily. 90 tablet 3  . magnesium oxide (MAG-OX) 400 MG tablet Take 400 mg by mouth every evening.     . potassium chloride (K-DUR,KLOR-CON) 10 MEQ tablet Take 1 tablet (10 mEq total) by mouth daily. 90 tablet 3  . zolpidem (AMBIEN CR) 12.5 MG CR tablet Take 1 tablet (12.5 mg total) by mouth at bedtime as needed for sleep. 30 tablet 1   No current facility-administered medications for this visit.    Allergies:   Prednisone; Bactrim; Flomax; and Sulfa antibiotics    Social History:  The patient  reports that she has never smoked. She has never used smokeless tobacco. She reports that she does not drink alcohol or use illicit drugs.    Family History:  The patient's family history includes Colon cancer in her sister; Heart disease in her father; Hyperlipidemia in her brother, sister, and sister; Hypertension in her brother, brother, brother, father, sister, sister, sister, sister, and sister.    ROS:  Please see the history of present illness.    Review of Systems: Constitutional:  denies fever, chills, diaphoresis, appetite change and fatigue.  HEENT: denies photophobia, eye pain, redness, hearing loss, ear pain, congestion, sore throat, rhinorrhea, sneezing, neck pain, neck stiffness and tinnitus.  Respiratory: denies SOB, DOE, cough, chest tightness, and wheezing.  Cardiovascular: denies chest pain, palpitations and leg swelling.  Gastrointestinal: denies nausea, vomiting, abdominal pain, diarrhea, constipation, blood in stool.  Genitourinary: denies dysuria, urgency, frequency, hematuria, flank pain and difficulty urinating.  Musculoskeletal: denies  myalgias, back pain, joint swelling, arthralgias and gait problem.   Skin: denies pallor, rash and wound.  Neurological: denies dizziness, seizures, syncope, weakness, light-headedness, numbness and headaches.   Hematological: denies adenopathy, easy bruising, personal or family bleeding history.  Psychiatric/ Behavioral: denies suicidal ideation, mood changes, confusion, nervousness, sleep disturbance and agitation.       All other systems are reviewed and negative.    PHYSICAL EXAM: VS:  BP 136/90 mmHg  Pulse 63  Ht 5' 4.5" (1.638 m)  Wt 157 lb (71.215 kg)  BMI 26.54 kg/m2 , BMI Body mass index is 26.54 kg/(m^2). GEN: Well nourished, well developed, in no acute distress HEENT: normal Neck: no JVD, carotid bruits, or masses Cardiac: RRR; no murmurs, rubs, or gallops,no edema  Respiratory:  clear to auscultation bilaterally, normal work of breathing GI: soft, nontender, nondistended, + BS MS: no deformity or atrophy Skin: warm and dry, no rash Neuro:   Strength and sensation are intact Psych: normal   EKG:  EKG is not ordered today.    Recent Labs: 03/15/2015: ALT 21; BUN 15; Creat 0.69; Potassium 3.9; Sodium 138    Lipid Panel    Component Value Date/Time   CHOL 187 03/15/2015 1219   TRIG 83 03/15/2015 1219   HDL 89 03/15/2015 1219   CHOLHDL 2.1 03/15/2015 1219   VLDL 17 03/15/2015 1219   LDLCALC 81 03/15/2015 1219      Wt Readings from Last 3 Encounters:  10/01/15 157 lb (71.215 kg)  03/18/15 156 lb 12.8 oz (71.124 kg)  09/17/14 155 lb 6.4 oz (70.489 kg)      Other studies Reviewed: Additional studies/ records that were reviewed today include: . Review of the above records demonstrates:    ASSESSMENT AND PLAN:  1.  Essential hypertension: Caitlin Walker is doing very well. Her blood pressure  is very well-controlled. Continue current medications. She has been exercising on a regular basis. She's also been watching her diet. I'll see her again in 6 months.    Current medicines are reviewed at length with the patient today.  The patient does not have concerns regarding medicines.  The following changes have been made:  no change  Labs/ tests ordered today include: No orders of the defined types were placed in this encounter.      Mertie Moores, MD  10/01/2015 9:20 AM    [facility] [address], [place], [place]  [address] Phone: [phone]; Fax: [phone]

## 2015-10-15 DIAGNOSIS — Q142 Congenital malformation of optic disc: Secondary | ICD-10-CM | POA: Diagnosis not present

## 2015-10-15 DIAGNOSIS — H43812 Vitreous degeneration, left eye: Secondary | ICD-10-CM | POA: Diagnosis not present

## 2015-10-15 DIAGNOSIS — H47323 Drusen of optic disc, bilateral: Secondary | ICD-10-CM | POA: Diagnosis not present

## 2015-10-15 DIAGNOSIS — H40013 Open angle with borderline findings, low risk, bilateral: Secondary | ICD-10-CM | POA: Diagnosis not present

## 2015-11-01 ENCOUNTER — Ambulatory Visit: Payer: Self-pay

## 2015-11-04 ENCOUNTER — Encounter: Payer: Self-pay | Admitting: Internal Medicine

## 2015-11-04 ENCOUNTER — Ambulatory Visit (INDEPENDENT_AMBULATORY_CARE_PROVIDER_SITE_OTHER): Payer: PPO | Admitting: Internal Medicine

## 2015-11-04 VITALS — BP 108/64 | HR 80 | Ht 65.0 in | Wt 165.0 lb

## 2015-11-04 DIAGNOSIS — J45991 Cough variant asthma: Secondary | ICD-10-CM | POA: Diagnosis not present

## 2015-11-04 LAB — NITRIC OXIDE: Nitric Oxide: 13

## 2015-11-04 MED ORDER — BENZONATATE 200 MG PO CAPS: ORAL_CAPSULE | ORAL | Status: DC

## 2015-11-04 MED ORDER — METHYLPREDNISOLONE ACETATE 80 MG/ML IJ SUSP
120.0000 mg | Freq: Once | INTRAMUSCULAR | Status: AC
  Administered 2015-11-04: 120 mg via INTRAMUSCULAR

## 2015-11-04 NOTE — Assessment & Plan Note (Signed)
Onset 06/2015 with ? Viral uri vs B pertussis   The acute cough to point of vomiting in the early phases is suggestive of B pertussis but certainly is non-specific  The most common causes of chronic cough in immunocompetent adults include the following: upper airway cough syndrome (UACS), previously referred to as postnasal drip syndrome (PNDS), which is caused by variety of rhinosinus conditions; (2) asthma; (3) GERD; (4) chronic bronchitis from cigarette smoking or other inhaled environmental irritants; (5) nonasthmatic eosinophilic bronchitis; and (6) bronchiectasis.   These conditions, singly or in combination, have accounted for up to 94% of the causes of chronic cough in prospective studies.   Other conditions have constituted no >6% of the causes in prospective studies These have included bronchogenic carcinoma, chronic interstitial pneumonia, sarcoidosis, left ventricular failure, ACEI-induced cough, and aspiration from a condition associated with pharyngeal dysfunction.    Chronic cough is often simultaneously caused by more than one condition. A single cause has been found from 38 to 82% of the time, multiple causes from 18 to 62%. Multiply caused cough has been the result of three diseases up to 42% of the time.       Based on hx and exam, this is most likely:  Classic Upper airway cough syndrome, so named because it's frequently impossible to sort out how much is  CR/sinusitis with freq throat clearing (which can be related to primary GERD)   vs  causing  secondary (" extra esophageal")  GERD from wide swings in gastric pressure that occur with throat clearing, often  promoting self use of mint and menthol lozenges that reduce the lower esophageal sphincter tone and exacerbate the problem further in a cyclical fashion.   These are the same pts (now being labeled as having "irritable larynx syndrome" by some cough centers) who not infrequently have a history of having failed to tolerate ace  inhibitors,  dry powder inhalers or biphosphonates or report having atypical reflux symptoms that don't respond to standard doses of PPI , and are easily confused as having aecopd or asthma flares by even experienced allergists/ pulmonologists.   The first step is to maximize acid suppression and eliminate cyclical coughing then regroup if the cough persists.   Reviewed: Of the three most common causes of chronic cough, only one (GERD)  can actually cause the other two (asthma and post nasal drip syndrome)  and perpetuate the cylce of cough inducing airway trauma, inflammation, heightened sensitivity to reflux which is prompted by the cough itself via a cyclical mechanism.    This may partially respond to steroids and look like asthma and post nasal drainage but never erradicated completely unless the cough and the secondary reflux are eliminated, preferably both at the same time.  While not intuitively obvious, many patients with chronic low grade reflux do not cough until there is a secondary insult that disturbs the protective epithelial barrier and exposes sensitive nerve endings.  This can be viral or direct physical injury such as with an endotracheal tube.   The point is that once this occurs, it is difficult to eliminate using anything but a maximally effective acid suppression regimen at least in the short run, accompanied by an appropriate diet to address non acid GERD.     Total time devoted to counseling  = 35/18m review case with pt/ discussion of options/alternatives/ personally creating written instructions  in presence of pt  then going over those specific  Instructions directly with the pt including how to  use all of the meds but in particular covering each new medication in detail and the difference between the maintenance/automatic meds and the prns using an action plan format for the latter.

## 2015-11-04 NOTE — Progress Notes (Signed)
Subjective:    Patient ID: Caitlin Walker, female    DOB: 1946/01/12   MRN: [id number]  HPI   39 yobf never smoker acutely ill with head cold early Feb 2017 then 2 weeks settled win chest with severe cough to vomiting > rx tessalon / albuterol / phenergan codeine / erthythromicin / amoxicillin/ zpak all ineffective / depomedrol injection might have helped for a week or so referred to pulmonary clinic 11/04/2015 by Dr Melinda Crutch  11/04/2015 1st [facility] office visit/    Chief Complaint  Patient presents with  . Pulmonary Consult    Referred by Dr. Melinda Crutch. Pt c/o cough since Feb 2017. Cough is esp worse at night and is non prod.   cough is not as violoent now on tessilon which may have helped some as well as zyretc/flonase  Cough is more often day than noct, worse with talking / laughter  Not assoc with food   Was taking ppi before onset x 20 mg and has tried on vs off no difference.  No sob unless coughing  Offered gabapentin by ENT but declined to take it   No obvious other patterns in day to day or daytime variabilty or assoc sob or cp or chest tightness, subjective wheeze overt sinus or active hb symptoms. No unusual exp hx or h/o childhood pna/ asthma or knowledge of premature birth.  Sleeping ok without nocturnal  or early am exacerbation  of respiratory  c/o's or need for noct saba. Also denies any obvious fluctuation of symptoms with weather or environmental changes or other aggravating or alleviating factors except as outlined above   Current Medications, Allergies, Complete Past Medical History, Past Surgical History, Family History, and Social History were reviewed in [employer] record.         Review of Systems  Constitutional: Negative for fever, chills and unexpected weight change.  HENT: Negative for congestion, dental problem, ear pain, nosebleeds, postnasal drip, rhinorrhea, sinus pressure, sneezing, sore throat, trouble swallowing  and voice change.   Eyes: Negative for visual disturbance.  Respiratory: Positive for cough and shortness of breath. Negative for choking.   Cardiovascular: Negative for chest pain and leg swelling.  Gastrointestinal: Negative for vomiting, abdominal pain and diarrhea.       Acid heartburn  Genitourinary: Negative for difficulty urinating.  Musculoskeletal: Negative for arthralgias.  Skin: Negative for rash.  Neurological: Negative for tremors, syncope and headaches.  Hematological: Does not bruise/bleed easily.       Objective:   Physical Exam   Very pleasant amb bf nad   Wt Readings from Last 3 Encounters:  11/04/15 165 lb (74.844 kg)  10/01/15 157 lb (71.215 kg)  03/18/15 156 lb 12.8 oz (71.124 kg)    Vital signs reviewed   HEENT: nl dentition, turbinates, and oropharynx. Nl external ear canals without cough reflex   NECK :  without JVD/Nodes/TM/ nl carotid upstrokes bilaterally   LUNGS: no acc muscle use,  Nl contour chest which is clear to A and P bilaterally without cough on insp or exp maneuvers   CV:  RRR  no s3 or murmur or increase in P2, no edema   ABD:  soft and nontender with nl inspiratory excursion in the supine position. No bruits or organomegaly, bowel sounds nl  MS:  Nl gait/ ext warm without deformities, calf tenderness, cyanosis or clubbing No obvious joint restrictions   SKIN: warm and dry without lesions    NEURO:  alert, approp, nl sensorium with  no motor deficits    cxr in March 2017 ok per pt not available on epic or care everywhere       Assessment & Plan:

## 2015-11-04 NOTE — Patient Instructions (Addendum)
Depomedrol 120 mg IM given  Prilosec 20 mg x 2 Take 30- 60 min before your first and last meals of the day   For cough > tessalon 200 mg up to four times daily to completely stop all coughing  GERD (REFLUX)  is an extremely common cause of respiratory symptoms just like yours , many times with no obvious heartburn at all.    It can be treated with medication, but also with lifestyle changes including elevation of the head of your bed (ideally with 6 inch  bed blocks),  Smoking cessation, avoidance of late meals, excessive alcohol, and avoid fatty foods, chocolate, peppermint, colas, red wine, and acidic juices such as orange juice.  NO MINT OR MENTHOL PRODUCTS SO NO COUGH DROPS  USE SUGARLESS CANDY INSTEAD (Jolley ranchers or Stover's or Life Savers) or even ice chips will also do - the key is to swallow to prevent all throat clearing. NO OIL BASED VITAMINS - use powdered substitutes.    If not better in 2 weeks return here, if better tell your friends

## 2015-11-13 ENCOUNTER — Other Ambulatory Visit: Payer: Self-pay | Admitting: Family Medicine

## 2015-11-13 DIAGNOSIS — Z1231 Encounter for screening mammogram for malignant neoplasm of breast: Secondary | ICD-10-CM

## 2015-11-14 DIAGNOSIS — R252 Cramp and spasm: Secondary | ICD-10-CM | POA: Diagnosis not present

## 2015-11-28 ENCOUNTER — Ambulatory Visit
Admission: RE | Admit: 2015-11-28 | Discharge: 2015-11-28 | Disposition: A | Discharge status: Home / Self Care | Payer: PPO | Source: Ambulatory Visit | Attending: Family Medicine | Admitting: Family Medicine

## 2015-11-28 DIAGNOSIS — Z1231 Encounter for screening mammogram for malignant neoplasm of breast: Secondary | ICD-10-CM | POA: Diagnosis not present

## 2015-11-28 DIAGNOSIS — Z113 Encounter for screening for infections with a predominantly sexual mode of transmission: Secondary | ICD-10-CM | POA: Diagnosis not present

## 2015-11-28 DIAGNOSIS — Z1159 Encounter for screening for other viral diseases: Secondary | ICD-10-CM | POA: Diagnosis not present

## 2015-11-28 DIAGNOSIS — Z114 Encounter for screening for human immunodeficiency virus [HIV]: Secondary | ICD-10-CM | POA: Diagnosis not present

## 2015-11-28 DIAGNOSIS — Z124 Encounter for screening for malignant neoplasm of cervix: Secondary | ICD-10-CM | POA: Diagnosis not present

## 2015-11-28 DIAGNOSIS — Z6827 Body mass index (BMI) 27.0-27.9, adult: Secondary | ICD-10-CM | POA: Diagnosis not present

## 2015-11-30 ENCOUNTER — Emergency Department
Admission: EM | Admit: 2015-11-30 | Discharge: 2015-11-30 | Disposition: A | Discharge status: Home / Self Care | Payer: PPO | Attending: Emergency Medicine | Admitting: Emergency Medicine

## 2015-11-30 ENCOUNTER — Encounter: Payer: Self-pay | Admitting: Emergency Medicine

## 2015-11-30 DIAGNOSIS — Z79899 Other long term (current) drug therapy: Secondary | ICD-10-CM | POA: Insufficient documentation

## 2015-11-30 DIAGNOSIS — F419 Anxiety disorder, unspecified: Secondary | ICD-10-CM | POA: Diagnosis not present

## 2015-11-30 DIAGNOSIS — I10 Essential (primary) hypertension: Secondary | ICD-10-CM | POA: Diagnosis not present

## 2015-11-30 DIAGNOSIS — R252 Cramp and spasm: Secondary | ICD-10-CM | POA: Insufficient documentation

## 2015-11-30 DIAGNOSIS — Z7982 Long term (current) use of aspirin: Secondary | ICD-10-CM | POA: Diagnosis not present

## 2015-11-30 DIAGNOSIS — E785 Hyperlipidemia, unspecified: Secondary | ICD-10-CM | POA: Diagnosis not present

## 2015-11-30 LAB — COMPREHENSIVE METABOLIC PANEL WITH GFR
ALT: 16 U/L (ref 14–54)
AST: 23 U/L (ref 15–41)
Albumin: 4.2 g/dL (ref 3.5–5.0)
Alkaline Phosphatase: 89 U/L (ref 38–126)
Anion gap: 8 (ref 5–15)
BUN: 9 mg/dL (ref 6–20)
CO2: 27 mmol/L (ref 22–32)
Calcium: 8.9 mg/dL (ref 8.9–10.3)
Chloride: 97 mmol/L — ABNORMAL LOW (ref 101–111)
Creatinine, Ser: 0.62 mg/dL (ref 0.44–1.00)
GFR calc Af Amer: >60 mL/min
GFR calc non Af Amer: >60 mL/min
Glucose, Bld: 93 mg/dL (ref 65–99)
Potassium: 3.9 mmol/L (ref 3.5–5.1)
Sodium: 132 mmol/L — ABNORMAL LOW (ref 135–145)
Total Bilirubin: 0.4 mg/dL (ref 0.3–1.2)
Total Protein: 7.2 g/dL (ref 6.5–8.1)

## 2015-11-30 LAB — MAGNESIUM: Magnesium: 2 mg/dL (ref 1.7–2.4)

## 2015-11-30 LAB — CBC WITH DIFFERENTIAL/PLATELET
Basophils Absolute: 0 10*3/uL (ref 0–0.1)
Basophils Relative: 1 %
Eosinophils Absolute: 0.3 10*3/uL (ref 0–0.7)
Eosinophils Relative: 5 %
HCT: 36.8 % (ref 35.0–47.0)
Hemoglobin: 12.6 g/dL (ref 12.0–16.0)
Lymphocytes Relative: 20 %
Lymphs Abs: 1.1 10*3/uL (ref 1.0–3.6)
MCH: 29.8 pg (ref 26.0–34.0)
MCHC: 34.3 g/dL (ref 32.0–36.0)
MCV: 86.8 fL (ref 80.0–100.0)
Monocytes Absolute: 0.5 10*3/uL (ref 0.2–0.9)
Monocytes Relative: 8 %
Neutro Abs: 3.6 10*3/uL (ref 1.4–6.5)
Neutrophils Relative %: 66 %
Platelets: 283 10*3/uL (ref 150–440)
RBC: 4.24 MIL/uL (ref 3.80–5.20)
RDW: 14.7 % — ABNORMAL HIGH (ref 11.5–14.5)
WBC: 5.5 10*3/uL (ref 3.6–11.0)

## 2015-11-30 NOTE — ED Notes (Signed)
Pt in via triage w/ complaints of muscle cramps to bilateral legs and toes x approximately 1 week in addition to feeling "jittery" and "palpitations."  Pt reports being placed on a high dose of prilosec recently and is concerned that she may be experiencing negative side effects of that medication.  Pt A/Ox4, no immediate distress noted at this time.

## 2015-11-30 NOTE — ED Triage Notes (Signed)
States had whooping cough and the pulmonologist put her on high dose of prilosec and now feels jittery, and like her heart racing and cramps in hands and legs

## 2015-11-30 NOTE — ED Provider Notes (Signed)
[facility] Emergency Department Provider Note  ____________________________________________   First MD Initiated Contact with Patient 11/30/15 Caitlin Walker     (approximate)  I have reviewed the triage vital signs and the nursing notes.   HISTORY  Chief Complaint Hand Pain (hand and foot cramps) and Tachycardia (feels like heart racing)    HPI Caitlin Walker is a 70 y.o. female who presents for evaluation of a variety of symptoms including muscle cramps in her lower extremities, specifically bilateral feet and great toes, generalized fatigue, and feeling jittery, like her heart is racing.  She occasionally feels cramps and bilateral hands.  She states that this all started after suffering from a dry cough for several weeks that her primary care doctor felt was whooping cough.  She finished her lengthy course of azithromycin.  She then went to a pulmonologist when her cough continued and he started her on Prilosec, thinking that her acid reflux was contributing to or causing her cough.  She states that after going on the higher doses of Prilosec (40 mg twice a day), she has been feeling these somewhat random symptoms of jitteriness and muscle cramping.  She reports that she had some labs done as an outpatient and was told that her chloride was low.  She does not know if this may be contributing to her symptoms as well.  She describes the cramps as severe at times, most frequently mild, intermittent, and nothing makes it better nor worse.  She does note that she is under increased stress at home because her mother-in-law recently passed away and "I am the one that has to be strong".  She has been suffering from insomnia as well.   Past Medical History:  Diagnosis Date  . Chronic leukopenia DX  2011   MILD--  HEMATOLOGIST  --  DR Beryle Beams  . H/O hiatal hernia    TINY PER EGD 2007  . Hematuria   . Hemorrhoids    INTERNAL AND EXTERNAL   . History of colon polyps      BENIGN  . Hyperlipidemia   . Hypertension   . Ureterocele    LEFT  . Wears contact lenses     Patient Active Problem List   Diagnosis Date Noted  . Cough variant asthma vs UCAS 11/04/2015  . Leukopenia 11/02/2012  . Chest pain 11/20/2010  . Hypertension 08/14/2010    Past Surgical History:  Procedure Laterality Date  . APPENDECTOMY  1989  . COLONOSCOPY N/A 08/01/2012   Procedure: COLONOSCOPY;  Surgeon: Jeryl Columbia, MD;  Location: [facility];  Service: Endoscopy;  Laterality: N/A;  pt. would like dan to do procedure  . CYSTO/  URETHRAL DILATION /  RIGHT RETROGRADE PYELOGRAM/ RIGHT URETEROSCOPY/  INSTILLATION THERAPY  06-18-2008  . CYSTOSCOPY/RETROGRADE/URETEROSCOPY Left 10/26/2013   Procedure: CYSTOSCOPY/RETROGRADE/DIAGNOSTIC DIGITAL URETEROSCOPY WITH LEFT URETERAL STENT PLACEMENT.;  Surgeon: Alexis Frock, MD;  Location: [facility];  Service: Urology;  Laterality: Left;  . DILATION AND CURETTAGE OF UTERUS  2001  . SUPRACERVICAL ABDOMINAL HYSTERECTOMY  02-25-2000   W/   BILATERAL SALPINGOOPHORECTOMY AND RECTOCELE REPAIR    Prior to Admission medications   Medication Sig Start Date End Date Taking? Authorizing Provider  albuterol (VENTOLIN HFA) 108 (90 Base) MCG/ACT inhaler Inhale 2 puffs into the lungs every 6 (six) hours as needed for wheezing or shortness of breath.    Historical Provider, MD  aspirin 81 MG tablet Take 81 mg by mouth every evening.  Historical Provider, MD  benzonatate (TESSALON) 200 MG capsule Up to 4 x daily for complete cough elimination goal 11/04/15   Tanda Rockers, MD  calcium carbonate (OS-CAL) 600 MG TABS Take 600 mg by mouth daily with breakfast.     Historical Provider, MD  hydrochlorothiazide (MICROZIDE) 12.5 MG capsule TAKE 2 CAPSULES BY MOUTH EVERY MORNING. 11/17/13   Thayer Headings, MD  losartan (COZAAR) 100 MG tablet Take 1 tablet (100 mg total) by mouth daily. 10/01/15   Thayer Headings, MD  magnesium oxide (MAG-OX) 400 MG  tablet Take 400 mg by mouth every evening.     Historical Provider, MD  potassium chloride (K-DUR,KLOR-CON) 10 MEQ tablet Take 1 tablet (10 mEq total) by mouth daily. 05/08/15   Thayer Headings, MD  zolpidem (AMBIEN CR) 12.5 MG CR tablet Take 1 tablet (12.5 mg total) by mouth at bedtime as needed for sleep. 08/14/10   Thayer Headings, MD    Allergies Diovan [valsartan]; Prednisone; Bactrim; Flomax [tamsulosin hcl]; and Sulfa antibiotics  Family History  Problem Relation Age of Onset  . Heart disease Father   . Hypertension Father   . Emphysema Father     smoked  . Hypertension Sister   . Hyperlipidemia Sister   . Hypertension Brother   . Hypertension Sister   . Colon cancer Sister   . Hypertension Sister   . Hypertension Sister   . Hypertension Sister   . Hyperlipidemia Sister   . Hypertension Brother   . Hypertension Brother   . Hyperlipidemia Brother     Social History Social History  Substance Use Topics  . Smoking status: Never Smoker  . Smokeless tobacco: Never Used  . Alcohol use No    Review of Systems Constitutional: No fever/chills.  Generalized fatigue. Eyes: No visual changes. ENT: No sore throat. Cardiovascular: Denies chest pain.  Occasionally has felt jittery and like she is having a racing heartbeat Respiratory: Denies shortness of breath. Gastrointestinal: No abdominal pain.  No nausea, no vomiting.  No diarrhea.  No constipation. Genitourinary: Negative for dysuria. Musculoskeletal: Intermittent cramping pain in her hands bilaterally and feet bilaterally Skin: Negative for rash. Neurological: Negative for headaches, focal weakness or numbness.  10-point ROS otherwise negative.  ____________________________________________   PHYSICAL EXAM:  VITAL SIGNS: ED Triage Vitals [11/30/15 1736]  Enc Vitals Group     BP (!) 151/92     Pulse Rate 74     Resp 18     Temp      Temp src      SpO2 100 %     Weight 158 lb (71.7 kg)     Height 5\' 4"  (1.626  m)     Head Circumference      Peak Flow      Pain Score 0     Pain Loc      Pain Edu?      Excl. in [place]?     Constitutional: Alert and oriented. Well appearing and in no acute distress. Eyes: Conjunctivae are normal. PERRL. EOMI. Head: Atraumatic. Nose: No congestion/rhinnorhea. Mouth/Throat: Mucous membranes are moist.  Oropharynx non-erythematous. Neck: No stridor.  No meningeal signs.   Cardiovascular: Normal rate, regular rhythm. Good peripheral circulation. Grossly normal heart sounds.   Respiratory: Normal respiratory effort.  No retractions. Lungs CTAB. Gastrointestinal: Soft and nontender. No distention.  Musculoskeletal: No lower extremity tenderness nor edema. No gross deformities of extremities. Neurologic:  Normal speech and language. No gross focal neurologic deficits  are appreciated.  Patellar and triceps reflexes are equal and normal bilaterally with no clonus. Skin:  Skin is warm, dry and intact. No rash noted. Psychiatric: Mood and affect are normal. Speech and behavior are normal.  ____________________________________________   LABS (all labs ordered are listed, but only abnormal results are displayed)  Labs Reviewed  CBC WITH DIFFERENTIAL/PLATELET - Abnormal; Notable for the following:       Result Value   RDW 14.7 (*)    All other components within normal limits  COMPREHENSIVE METABOLIC PANEL - Abnormal; Notable for the following:    Sodium 132 (*)    Chloride 97 (*)    All other components within normal limits  MAGNESIUM   ____________________________________________  EKG  ED ECG REPORT I, , , the attending physician, personally viewed and interpreted this ECG.  Date: 11/30/2015 EKG Time: 17:34 Rate: 67 Rhythm: normal sinus rhythm QRS Axis: normal Intervals: normal ST/T Wave abnormalities: normal Conduction Disturbances: none Narrative Interpretation: unremarkable  ____________________________________________  RADIOLOGY   No  results found.  ____________________________________________   PROCEDURES  Procedure(s) performed:   Procedures   ____________________________________________   INITIAL IMPRESSION / ASSESSMENT AND PLAN / ED COURSE  Pertinent labs & imaging results that were available during my care of the patient were reviewed by me and considered in my medical decision making (see chart for details).  The patient's workup is unremarkable and reassuring.  She has no left-sided abnormalities.  There is no suggestion that she has been hyperventilating which also could have caused the cramping that she describes.  I had 2 long talks with her about how the stress she is under an all of the added anxiety could be contributing to her causing her symptoms.  I explained her reassuring medical workup and encourage close outpatient follow-up.  She understands and agrees with this plan at this time.  I gave my usual and customary return precautions.     Clinical Course    ____________________________________________  FINAL CLINICAL IMPRESSION(S) / ED DIAGNOSES  Final diagnoses:  Muscle cramps  Anxiety     MEDICATIONS GIVEN DURING THIS VISIT:  Medications - No data to display   NEW OUTPATIENT MEDICATIONS STARTED DURING THIS VISIT:  New Prescriptions   No medications on file      Note:  This document was prepared using Dragon voice recognition software and may include unintentional dictation errors.    Hinda Kehr, MD 11/30/15 2023

## 2015-11-30 NOTE — Discharge Instructions (Signed)
As we discussed, your workup today was reassuring.  Though we do not know exactly what is causing your symptoms, it appears that you have no emergent medical condition at this time are safe to go home and follow up as recommended in this paperwork.  Please return immediately to the Emergency Department if you develop any new or worsening symptoms that concern you.

## 2015-12-03 DIAGNOSIS — F430 Acute stress reaction: Secondary | ICD-10-CM | POA: Diagnosis not present

## 2015-12-31 DIAGNOSIS — F430 Acute stress reaction: Secondary | ICD-10-CM | POA: Diagnosis not present

## 2016-01-01 ENCOUNTER — Encounter: Payer: Self-pay | Admitting: Cardiovascular Disease

## 2016-01-27 DIAGNOSIS — Z0000 Encounter for general adult medical examination without abnormal findings: Secondary | ICD-10-CM | POA: Diagnosis not present

## 2016-01-27 DIAGNOSIS — G479 Sleep disorder, unspecified: Secondary | ICD-10-CM | POA: Diagnosis not present

## 2016-01-27 DIAGNOSIS — I10 Essential (primary) hypertension: Secondary | ICD-10-CM | POA: Diagnosis not present

## 2016-01-27 DIAGNOSIS — E7800 Pure hypercholesterolemia, unspecified: Secondary | ICD-10-CM | POA: Diagnosis not present

## 2016-02-05 DIAGNOSIS — H43813 Vitreous degeneration, bilateral: Secondary | ICD-10-CM | POA: Diagnosis not present

## 2016-03-30 ENCOUNTER — Ambulatory Visit (INDEPENDENT_AMBULATORY_CARE_PROVIDER_SITE_OTHER): Payer: PPO | Admitting: Cardiovascular Disease

## 2016-03-30 ENCOUNTER — Encounter: Payer: Self-pay | Admitting: Cardiovascular Disease

## 2016-03-30 VITALS — BP 142/90 | HR 70 | Ht 64.0 in | Wt 152.2 lb

## 2016-03-30 DIAGNOSIS — I10 Essential (primary) hypertension: Secondary | ICD-10-CM | POA: Diagnosis not present

## 2016-03-30 MED ORDER — POTASSIUM CHLORIDE CRYS ER 10 MEQ PO TBCR: 10.0000 meq | EXTENDED_RELEASE_TABLET | Freq: Every day | ORAL | 3 refills | Status: DC

## 2016-03-30 NOTE — Progress Notes (Signed)
Cardiology Office Note   Date:  03/30/2016   ID:  Caitlin Walker, DOB 26-Aug-1945, MRN [id number]  PCP:  Melinda Crutch, MD  Cardiologist:   Mertie Moores, MD   Chief Complaint  Patient presents with  . Hypertension   1. Hypertension 2. Insomnia  History of Present Illness:  Caitlin Walker is a 70 year old female with a history of hypertension. She has done very well. She's not having episodes of chest pain or shortness of breath. She avoids eating any extra salt. She exercises several times a week.  She is having trouble with insomnia.  May 12, 2012: She has been doing well from a cardiac standpoint. She has had some lightheadness. She also has had some leg cramps. She has not been exercising. She has been watching her salt intake. She still works full time in the [employer].  Oct. 1, 2014:  [place] is doing ok. Still has leg cramps. She has been busy - her was diagnosed with breast cancer. She is running around lots . No CP or dyspnea. Has occasional indigestion. She works in the [employer].   Nov. 12, 2014 She is doing ok  Sep 18, 2013:  Nov. 9, 2015:  Caitlin Walker is doing well.  She has retired. Has developed a neuroma on the bottom of her foot. BP has been a little higher.   Sep 17, 2014:   Caitlin Walker is a 70 y.o. female who presents for HTN She is doing well. No CP,  BP has been well controlled.   Nov. 14, 2016:  Doing well.  Has had some GI bleeding - has not found the source yet.  Has had upper ECG.  CT , barium swallow .  Lipids have improved significantly   Oct 01, 2015: Caitlin Walker is seen back today for eval of her HTN.  Was diagnosed with Purtussis in Feb.   Still coughing  BP is well controlled.  Remains active   03/30/2016:  Has been a rough year.  Is separated from her husband . Has had lots of anxiety  BP has been well controlled.  Occasional , rare episodes of CP   Past Medical History:  Diagnosis Date  . Chronic leukopenia DX   2011   MILD--  HEMATOLOGIST  --  DR Beryle Beams  . H/O hiatal hernia    TINY PER EGD 2007  . Hematuria   . Hemorrhoids    INTERNAL AND EXTERNAL   . History of colon polyps    BENIGN  . Hyperlipidemia   . Hypertension   . Ureterocele    LEFT  . Wears contact lenses     Past Surgical History:  Procedure Laterality Date  . APPENDECTOMY  1989  . COLONOSCOPY N/A 08/01/2012   Procedure: COLONOSCOPY;  Surgeon: Jeryl Columbia, MD;  Location: [facility];  Service: Endoscopy;  Laterality: N/A;  pt. would like dan to do procedure  . CYSTO/  URETHRAL DILATION /  RIGHT RETROGRADE PYELOGRAM/ RIGHT URETEROSCOPY/  INSTILLATION THERAPY  06-18-2008  . CYSTOSCOPY/RETROGRADE/URETEROSCOPY Left 10/26/2013   Procedure: CYSTOSCOPY/RETROGRADE/DIAGNOSTIC DIGITAL URETEROSCOPY WITH LEFT URETERAL STENT PLACEMENT.;  Surgeon: Alexis Frock, MD;  Location: [facility];  Service: Urology;  Laterality: Left;  . DILATION AND CURETTAGE OF UTERUS  2001  . SUPRACERVICAL ABDOMINAL HYSTERECTOMY  02-25-2000   W/   BILATERAL SALPINGOOPHORECTOMY AND RECTOCELE REPAIR     Current Outpatient Prescriptions  Medication Sig Dispense Refill  . aspirin 81 MG tablet Take 81 mg by mouth every  evening.     . calcium carbonate (OS-CAL) 600 MG TABS Take 600 mg by mouth daily with breakfast.     . hydrochlorothiazide (MICROZIDE) 12.5 MG capsule TAKE 2 CAPSULES BY MOUTH EVERY MORNING. 180 capsule 0  . losartan (COZAAR) 100 MG tablet Take 1 tablet (100 mg total) by mouth daily. 90 tablet 3  . magnesium oxide (MAG-OX) 400 MG tablet Take 400 mg by mouth every evening.     . potassium chloride (K-DUR,KLOR-CON) 10 MEQ tablet Take 1 tablet (10 mEq total) by mouth daily. 90 tablet 3  . zolpidem (AMBIEN CR) 12.5 MG CR tablet Take 1 tablet (12.5 mg total) by mouth at bedtime as needed for sleep. 30 tablet 1   No current facility-administered medications for this visit.     Allergies:   Diovan [valsartan]; Prednisone;  Bactrim; Flomax [tamsulosin hcl]; and Sulfa antibiotics    Social History:  The patient  reports that she has never smoked. She has never used smokeless tobacco. She reports that she does not drink alcohol or use drugs.   Family History:  The patient's family history includes Colon cancer in her sister; Emphysema in her father; Heart disease in her father; Hyperlipidemia in her brother, sister, and sister; Hypertension in her brother, brother, brother, father, sister, sister, sister, sister, and sister.    ROS:  Please see the history of present illness.    Review of Systems: Constitutional:  denies fever, chills, diaphoresis, appetite change and fatigue.  HEENT: denies photophobia, eye pain, redness, hearing loss, ear pain, congestion, sore throat, rhinorrhea, sneezing, neck pain, neck stiffness and tinnitus.  Respiratory: denies SOB, DOE, cough, chest tightness, and wheezing.  Cardiovascular: denies chest pain, palpitations and leg swelling.  Gastrointestinal: denies nausea, vomiting, abdominal pain, diarrhea, constipation, blood in stool.  Genitourinary: denies dysuria, urgency, frequency, hematuria, flank pain and difficulty urinating.  Musculoskeletal: denies  myalgias, back pain, joint swelling, arthralgias and gait problem.   Skin: denies pallor, rash and wound.  Neurological: denies dizziness, seizures, syncope, weakness, light-headedness, numbness and headaches.   Hematological: denies adenopathy, easy bruising, personal or family bleeding history.  Psychiatric/ Behavioral: denies suicidal ideation, mood changes, confusion, nervousness, sleep disturbance and agitation.       All other systems are reviewed and negative.    PHYSICAL EXAM: VS:  BP (!) 142/90 (BP Location: Left Arm, Patient Position: Sitting, Cuff Size: Normal)   Pulse 70   Ht 5\' 4"  (1.626 m)   Wt 152 lb 3.2 oz (69 kg)   BMI 26.13 kg/m  , BMI Body mass index is 26.13 kg/m. GEN: Well nourished, well  developed, in no acute distress  HEENT: normal  Neck: no JVD, carotid bruits, or masses Cardiac: RRR; no murmurs, rubs, or gallops,no edema  Respiratory:  clear to auscultation bilaterally, normal work of breathing GI: soft, nontender, nondistended, + BS MS: no deformity or atrophy  Skin: warm and dry, no rash Neuro:  Strength and sensation are intact Psych: normal   EKG:  EKG is not ordered today.    Recent Labs: 11/30/2015: ALT 16; BUN 9; Creatinine, Ser 0.62; Hemoglobin 12.6; Magnesium 2.0; Platelets 283; Potassium 3.9; Sodium 132    Lipid Panel    Component Value Date/Time   CHOL 187 03/15/2015 1219   TRIG 83 03/15/2015 1219   HDL 89 03/15/2015 1219   CHOLHDL 2.1 03/15/2015 1219   VLDL 17 03/15/2015 1219   LDLCALC 81 03/15/2015 1219      Wt Readings from Last 3  Encounters:  03/30/16 152 lb 3.2 oz (69 kg)  11/30/15 158 lb (71.7 kg)  11/04/15 165 lb (74.8 kg)      Other studies Reviewed: Additional studies/ records that were reviewed today include: . Review of the above records demonstrates:    ASSESSMENT AND PLAN:  1.  Essential hypertension: Voilet is doing very well. Her blood pressure is very well-controlled. Continue current medications. She has been exercising on a regular basis. She's also been watching her diet. I'll see her again in 1 year .   Current medicines are reviewed at length with the patient today.  The patient does not have concerns regarding medicines.  The following changes have been made:  no change  Labs/ tests ordered today include: No orders of the defined types were placed in this encounter.   Mertie Moores, MD  03/30/2016 11:42 AM    [facility] [address], [place], [place]  [address] Phone: [phone]; Fax: [phone]

## 2016-03-30 NOTE — Patient Instructions (Signed)
Medication Instructions:  Your physician recommends that you continue on your current medications as directed. Please refer to the Current Medication list given to you today.   Labwork: None Ordered   Testing/Procedures: None Ordered   Follow-Up: Your physician wants you to follow-up in: 1 year with Dr. Nahser.  You will receive a reminder letter in the mail two months in advance. If you don't receive a letter, please call our office to schedule the follow-up appointment.   If you need a refill on your cardiac medications before your next appointment, please call your pharmacy.   Thank you for choosing [facility]!  , RN [phone]    

## 2016-04-14 DIAGNOSIS — H40013 Open angle with borderline findings, low risk, bilateral: Secondary | ICD-10-CM | POA: Diagnosis not present

## 2016-07-18 ENCOUNTER — Encounter: Payer: Self-pay | Admitting: Gynecology

## 2016-07-18 ENCOUNTER — Ambulatory Visit
Admission: EM | Admit: 2016-07-18 | Discharge: 2016-07-18 | Disposition: A | Discharge status: Home / Self Care | Payer: PPO | Attending: Emergency Medicine | Admitting: Emergency Medicine

## 2016-07-18 DIAGNOSIS — J040 Acute laryngitis: Secondary | ICD-10-CM

## 2016-07-18 DIAGNOSIS — J069 Acute upper respiratory infection, unspecified: Secondary | ICD-10-CM

## 2016-07-18 LAB — RAPID STREP SCREEN (MED CTR MEBANE ONLY): Streptococcus, Group A Screen (Direct): NEGATIVE

## 2016-07-18 MED ORDER — HYDROCOD POLST-CPM POLST ER 10-8 MG/5ML PO SUER: 5.0000 mL | Freq: Two times a day (BID) | ORAL | 0 refills | Status: DC

## 2016-07-18 NOTE — ED Triage Notes (Signed)
Patient stated cough / sore throat fever oa 100 at home x today and headache.

## 2016-07-18 NOTE — ED Provider Notes (Signed)
CSN: [id number]     Arrival date & time 07/18/16  1506 History   First MD Initiated Contact with Patient 07/18/16 1636     Chief Complaint  Patient presents with  . Cough  . Sore Throat   (Consider location/radiation/quality/duration/timing/severity/associated sxs/prior Treatment) HPI  This a 71 year old female who presents with 3 day history of cough and sore throat  And fever over 100 at home. Has laryngitis and a headache as well. Presently temperature is 99, pulse of 85, blood pressure 134/70 ,respirations 16 O2 sats at 98%. 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 Taking Coricidin for her antihistamine.       Past Medical History:  Diagnosis Date  . Chronic leukopenia DX  2011   MILD--  HEMATOLOGIST  --  DR Beryle Beams  . H/O hiatal hernia    TINY PER EGD 2007  . Hematuria   . Hemorrhoids    INTERNAL AND EXTERNAL   . History of colon polyps    BENIGN  . Hyperlipidemia   . Hypertension   . Ureterocele    LEFT  . Wears contact lenses    Past Surgical History:  Procedure Laterality Date  . APPENDECTOMY  1989  . COLONOSCOPY N/A 08/01/2012   Procedure: COLONOSCOPY;  Surgeon: Jeryl Columbia, MD;  Location: [facility];  Service: Endoscopy;  Laterality: N/A;  pt. would like dan to do procedure  . CYSTO/  URETHRAL DILATION /  RIGHT RETROGRADE PYELOGRAM/ RIGHT URETEROSCOPY/  INSTILLATION THERAPY  06-18-2008  . CYSTOSCOPY/RETROGRADE/URETEROSCOPY Left 10/26/2013   Procedure: CYSTOSCOPY/RETROGRADE/DIAGNOSTIC DIGITAL URETEROSCOPY WITH LEFT URETERAL STENT PLACEMENT.;  Surgeon: Alexis Frock, MD;  [facility]: [facility];  Service: Urology;  Laterality: Left;  . DILATION AND CURETTAGE OF UTERUS  2001  . SUPRACERVICAL ABDOMINAL HYSTERECTOMY  02-25-2000   W/   BILATERAL SALPINGOOPHORECTOMY AND RECTOCELE REPAIR   Family History  Problem Relation Age of Onset  . Heart disease Father   . Hypertension Father   . Emphysema  Father     smoked  . Hypertension Sister   . Hyperlipidemia Sister   . Hypertension Brother   . Hypertension Sister   . Colon cancer Sister   . Hypertension Sister   . Hypertension Sister   . Hypertension Sister   . Hyperlipidemia Sister   . Hypertension Brother   . Hypertension Brother   . Hyperlipidemia Brother    Social History  Substance Use Topics  . Smoking status: Never Smoker  . Smokeless tobacco: Never Used  . Alcohol use No   OB History    No data available     Review of Systems  Constitutional: Positive for activity change, chills, fatigue and fever.  HENT: Positive for congestion, sore throat and voice change.   Respiratory: Positive for cough. Negative for shortness of breath, wheezing and stridor.   All other systems reviewed and are negative.   Allergies  Diovan [valsartan]; Prednisone; Bactrim; Flomax [tamsulosin hcl]; and Sulfa antibiotics  Home Medications   Prior to Admission medications   Medication Sig Start Date End Date Taking? Authorizing Provider  aspirin 81 MG tablet Take 81 mg by mouth every evening.    Yes Historical Provider, MD  calcium carbonate (OS-CAL) 600 MG TABS Take 600 mg by mouth daily with breakfast.    Yes Historical Provider, MD  hydrochlorothiazide (MICROZIDE) 12.5 MG capsule TAKE 2 CAPSULES BY MOUTH EVERY MORNING. 11/17/13  Yes Thayer Headings, MD  losartan (COZAAR) 100  MG tablet Take 1 tablet (100 mg total) by mouth daily. 10/01/15  Yes Thayer Headings, MD  magnesium oxide (MAG-OX) 400 MG tablet Take 400 mg by mouth every evening.    Yes Historical Provider, MD  potassium chloride (K-DUR,KLOR-CON) 10 MEQ tablet Take 1 tablet (10 mEq total) by mouth daily. 03/30/16  Yes Thayer Headings, MD  zolpidem (AMBIEN CR) 12.5 MG CR tablet Take 1 tablet (12.5 mg total) by mouth at bedtime as needed for sleep. 08/14/10  Yes Thayer Headings, MD  chlorpheniramine-HYDROcodone [facility] PENNKINETIC ER) 10-8 MG/5ML SUER Take 5 mLs by mouth 2 (two)  times daily. 07/18/16   Lorin Picket, PA-C   Meds Ordered and Administered this Visit  Medications - No data to display  BP 134/78 (BP Location: Left Arm)   Pulse 85   Temp 99 F (37.2 C) (Oral)   Resp 16   Ht 5\' 4"  (1.626 m)   Wt 150 lb (68 kg)   SpO2 98%   BMI 25.75 kg/m  No data found.   Physical Exam  Constitutional: She is oriented to person, place, and time. She appears well-developed and well-nourished. No distress.  HENT:  Head: Normocephalic and atraumatic.  Right Ear: External ear normal.  Left Ear: External ear normal.  Nose: Nose normal.  Mouth/Throat: Oropharynx is clear and moist. No oropharyngeal exudate.  Eyes: Pupils are equal, round, and reactive to light. Right eye exhibits no discharge. Left eye exhibits no discharge.  Neck: Normal range of motion.  Pulmonary/Chest: Effort normal and breath sounds normal. No respiratory distress. She has no wheezes. She has no rales.  Musculoskeletal: Normal range of motion.  Neurological: She is alert and oriented to person, place, and time.  Skin: Skin is warm and dry. She is not diaphoretic.  Psychiatric: She has a normal mood and affect. Her behavior is normal. Judgment and thought content normal.  Nursing note and vitals reviewed.   Urgent Care Course     Procedures (including critical care time)  Labs Review Labs Reviewed  RAPID STREP SCREEN (NOT AT [facility])  CULTURE, GROUP A STREP [facility])    Imaging Review No results found.   Visual Acuity Review  Right Eye Distance:   Left Eye Distance:   Bilateral Distance:    Right Eye Near:   Left Eye Near:    Bilateral Near:         MDM   1. Upper respiratory tract infection, unspecified type   2. Laryngitis    Discharge Medication List as of 07/18/2016  4:54 PM    START taking these medications   Details  chlorpheniramine-HYDROcodone (TUSSIONEX PENNKINETIC ER) 10-8 MG/5ML SUER Take 5 mLs by mouth 2 (two) times daily., Starting Sat 07/18/2016,  Print      Plan: 1. Test/x-ray results and diagnosis reviewed with patient 2. rx as per orders; risks, benefits, potential side effects reviewed with patient 3. Recommend supportive treatment with Rest and fluids. Use Tylenol for fever and pains. I told her this is most likely a viral illness and does not require antibiotic therapy. She continues to have symptoms or if it worsens we may consider antibiotics at that time. 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 Today off if she needs it but expect  she should be able to return to work on Wednesday. 4. F/u prn if symptoms worsen or don't improve     Lorin Picket, PA-C 07/18/16 [place]  Chilton Si, PA-C 07/18/16 1702

## 2016-07-21 LAB — CULTURE, GROUP A STREP (THRC)

## 2016-07-27 DIAGNOSIS — G4700 Insomnia, unspecified: Secondary | ICD-10-CM | POA: Diagnosis not present

## 2016-07-27 DIAGNOSIS — R05 Cough: Secondary | ICD-10-CM | POA: Diagnosis not present

## 2016-08-18 DIAGNOSIS — H40013 Open angle with borderline findings, low risk, bilateral: Secondary | ICD-10-CM | POA: Diagnosis not present

## 2016-11-16 ENCOUNTER — Other Ambulatory Visit: Payer: Self-pay | Admitting: Family Medicine

## 2016-11-16 DIAGNOSIS — Z1231 Encounter for screening mammogram for malignant neoplasm of breast: Secondary | ICD-10-CM

## 2016-12-03 ENCOUNTER — Ambulatory Visit
Admission: RE | Admit: 2016-12-03 | Discharge: 2016-12-03 | Disposition: A | Discharge status: Home / Self Care | Payer: Self-pay | Source: Ambulatory Visit | Attending: Family Medicine | Admitting: Family Medicine

## 2016-12-03 DIAGNOSIS — Z1231 Encounter for screening mammogram for malignant neoplasm of breast: Secondary | ICD-10-CM

## 2016-12-07 ENCOUNTER — Other Ambulatory Visit: Payer: Self-pay | Admitting: Family Medicine

## 2016-12-07 DIAGNOSIS — R928 Other abnormal and inconclusive findings on diagnostic imaging of breast: Secondary | ICD-10-CM

## 2016-12-07 DIAGNOSIS — N958 Other specified menopausal and perimenopausal disorders: Secondary | ICD-10-CM | POA: Diagnosis not present

## 2016-12-07 DIAGNOSIS — Z01419 Encounter for gynecological examination (general) (routine) without abnormal findings: Secondary | ICD-10-CM | POA: Diagnosis not present

## 2016-12-07 DIAGNOSIS — M816 Localized osteoporosis [Lequesne]: Secondary | ICD-10-CM | POA: Diagnosis not present

## 2016-12-07 DIAGNOSIS — Z6826 Body mass index (BMI) 26.0-26.9, adult: Secondary | ICD-10-CM | POA: Diagnosis not present

## 2016-12-10 ENCOUNTER — Ambulatory Visit: Payer: PPO

## 2016-12-10 ENCOUNTER — Ambulatory Visit
Admission: RE | Admit: 2016-12-10 | Discharge: 2016-12-10 | Disposition: A | Discharge status: Home / Self Care | Payer: PPO | Source: Ambulatory Visit | Attending: Family Medicine | Admitting: Family Medicine

## 2016-12-10 ENCOUNTER — Other Ambulatory Visit: Payer: Self-pay | Admitting: Family Medicine

## 2016-12-10 DIAGNOSIS — N6489 Other specified disorders of breast: Secondary | ICD-10-CM | POA: Diagnosis not present

## 2016-12-10 DIAGNOSIS — R928 Other abnormal and inconclusive findings on diagnostic imaging of breast: Secondary | ICD-10-CM

## 2016-12-10 DIAGNOSIS — N6320 Unspecified lump in the left breast, unspecified quadrant: Secondary | ICD-10-CM

## 2016-12-11 ENCOUNTER — Other Ambulatory Visit: Payer: Self-pay | Admitting: Family Medicine

## 2016-12-11 DIAGNOSIS — N6320 Unspecified lump in the left breast, unspecified quadrant: Secondary | ICD-10-CM

## 2016-12-14 ENCOUNTER — Ambulatory Visit
Admission: RE | Admit: 2016-12-14 | Discharge: 2016-12-14 | Disposition: A | Discharge status: Home / Self Care | Payer: PPO | Source: Ambulatory Visit | Attending: Family Medicine | Admitting: Family Medicine

## 2016-12-14 DIAGNOSIS — N6321 Unspecified lump in the left breast, upper outer quadrant: Secondary | ICD-10-CM | POA: Diagnosis not present

## 2016-12-14 DIAGNOSIS — N6489 Other specified disorders of breast: Secondary | ICD-10-CM

## 2016-12-14 DIAGNOSIS — N6012 Diffuse cystic mastopathy of left breast: Secondary | ICD-10-CM | POA: Diagnosis not present

## 2016-12-14 DIAGNOSIS — N6320 Unspecified lump in the left breast, unspecified quadrant: Secondary | ICD-10-CM

## 2017-02-19 DIAGNOSIS — I10 Essential (primary) hypertension: Secondary | ICD-10-CM | POA: Diagnosis not present

## 2017-02-19 DIAGNOSIS — E7800 Pure hypercholesterolemia, unspecified: Secondary | ICD-10-CM | POA: Diagnosis not present

## 2017-02-22 DIAGNOSIS — Z0000 Encounter for general adult medical examination without abnormal findings: Secondary | ICD-10-CM | POA: Diagnosis not present

## 2017-02-22 DIAGNOSIS — G4700 Insomnia, unspecified: Secondary | ICD-10-CM | POA: Diagnosis not present

## 2017-02-22 DIAGNOSIS — I10 Essential (primary) hypertension: Secondary | ICD-10-CM | POA: Diagnosis not present

## 2017-02-23 DIAGNOSIS — H40013 Open angle with borderline findings, low risk, bilateral: Secondary | ICD-10-CM | POA: Diagnosis not present

## 2017-04-05 ENCOUNTER — Ambulatory Visit: Payer: PPO | Admitting: Cardiovascular Disease

## 2017-04-05 ENCOUNTER — Encounter: Payer: Self-pay | Admitting: Cardiovascular Disease

## 2017-04-05 VITALS — BP 148/86 | HR 59 | Ht 65.0 in | Wt 160.0 lb

## 2017-04-05 DIAGNOSIS — I10 Essential (primary) hypertension: Secondary | ICD-10-CM

## 2017-04-05 MED ORDER — POTASSIUM CHLORIDE CRYS ER 10 MEQ PO TBCR: 10.0000 meq | EXTENDED_RELEASE_TABLET | Freq: Every day | ORAL | 3 refills | Status: DC

## 2017-04-05 MED ORDER — LOSARTAN POTASSIUM 100 MG PO TABS: 100.0000 mg | ORAL_TABLET | Freq: Every day | ORAL | 3 refills | Status: DC

## 2017-04-05 MED ORDER — HYDROCHLOROTHIAZIDE 12.5 MG PO CAPS: 25.0000 mg | ORAL_CAPSULE | Freq: Every morning | ORAL | 3 refills | Status: DC

## 2017-04-05 NOTE — Progress Notes (Signed)
Cardiology Office Note   Date:  04/05/2017   ID:  Caitlin Walker, DOB 1945-07-08, MRN [id number]  PCP:  Lawerance Cruel, MD  Cardiologist:   Mertie Moores, MD   Chief Complaint  Patient presents with  . Hypertension   1. Hypertension 2. Insomnia  History of Present Illness:  Caitlin Walker is a 71 year old female with a history of hypertension. She has done very well. She's not having episodes of chest pain or shortness of breath. She avoids eating any extra salt. She exercises several times a week.  She is having trouble with insomnia.  May 12, 2012: She has been doing well from a cardiac standpoint. She has had some lightheadness. She also has had some leg cramps. She has not been exercising. She has been watching her salt intake. She still works full time in the [place].  Oct. 1, 2014:  Caitlin Walker is doing ok. Still has leg cramps. She has been busy - her was diagnosed with breast cancer. She is running around lots . No CP or dyspnea. Has occasional indigestion. She works in the [place].   Nov. 12, 2014 She is doing ok  Sep 18, 2013:  Nov. 9, 2015:  Caitlin Walker is doing well.  She has retired. Has developed a neuroma on the bottom of her foot. BP has been a little higher.   Sep 17, 2014:   Caitlin Walker is a 71 y.o. female who presents for HTN She is doing well. No CP,  BP has been well controlled.   Nov. 14, 2016:  Doing well.  Has had some GI bleeding - has not found the source yet.  Has had upper ECG.  CT , barium swallow .  Lipids have improved significantly   Oct 01, 2015: Caitlin Walker is seen back today for eval of her HTN.  Was diagnosed with Purtussis in Feb.   Still coughing  BP is well controlled.  Remains active   03/30/2016:  Has been a rough year.  Is separated from her husband . Has had lots of anxiety  BP has been well controlled.  Occasional , rare episodes of CP   April 05, 2017:  Doing well from a cardiac  standpoint .    BP has been well controled.    Just did a 6 night stretch of work  ( 52 hours )  Feels well   Past Medical History:  Diagnosis Date  . Chronic leukopenia DX  2011   MILD--  HEMATOLOGIST  --  DR Beryle Beams  . H/O hiatal hernia    TINY PER EGD 2007  . Hematuria   . Hemorrhoids    INTERNAL AND EXTERNAL   . History of colon polyps    BENIGN  . Hyperlipidemia   . Hypertension   . Ureterocele    LEFT  . Wears contact lenses     Past Surgical History:  Procedure Laterality Date  . APPENDECTOMY  1989  . COLONOSCOPY N/A 08/01/2012   Procedure: COLONOSCOPY;  Surgeon: Jeryl Columbia, MD;  Location: [facility];  Service: Endoscopy;  Laterality: N/A;  pt. would like dan to do procedure  . CYSTO/  URETHRAL DILATION /  RIGHT RETROGRADE PYELOGRAM/ RIGHT URETEROSCOPY/  INSTILLATION THERAPY  06-18-2008  . CYSTOSCOPY/RETROGRADE/URETEROSCOPY Left 10/26/2013   Procedure: CYSTOSCOPY/RETROGRADE/DIAGNOSTIC DIGITAL URETEROSCOPY WITH LEFT URETERAL STENT PLACEMENT.;  Surgeon: Alexis Frock, MD;  Location: [facility];  Service: Urology;  Laterality: Left;  . DILATION AND CURETTAGE OF UTERUS  2001  .  SUPRACERVICAL ABDOMINAL HYSTERECTOMY  02-25-2000   W/   BILATERAL SALPINGOOPHORECTOMY AND RECTOCELE REPAIR     Current Outpatient Medications  Medication Sig Dispense Refill  . aspirin 81 MG tablet Take 81 mg by mouth every evening.     . calcium carbonate (OS-CAL) 600 MG TABS Take 600 mg by mouth daily with breakfast.     . hydrochlorothiazide (MICROZIDE) 12.5 MG capsule TAKE 2 CAPSULES BY MOUTH EVERY MORNING. 180 capsule 0  . losartan (COZAAR) 100 MG tablet Take 1 tablet (100 mg total) by mouth daily. 90 tablet 3  . magnesium oxide (MAG-OX) 400 MG tablet Take 400 mg by mouth every evening.     . potassium chloride (K-DUR,KLOR-CON) 10 MEQ tablet Take 1 tablet (10 mEq total) by mouth daily. 90 tablet 3  . zolpidem (AMBIEN CR) 12.5 MG CR tablet Take 1 tablet (12.5 mg total)  by mouth at bedtime as needed for sleep. 30 tablet 1   No current facility-administered medications for this visit.     Allergies:   Diovan [valsartan]; Prednisone; Bactrim; Flomax [tamsulosin hcl]; and Sulfa antibiotics    Social History:  The patient  reports that  has never smoked. she has never used smokeless tobacco. She reports that she does not drink alcohol or use drugs.   Family History:  The patient's family history includes Colon cancer in her sister; Emphysema in her father; Heart disease in her father; Hyperlipidemia in her brother, sister, and sister; Hypertension in her brother, brother, brother, father, sister, sister, sister, sister, and sister.    ROS: As noted in the current history.  Otherwise the review of systems is negative.  Physical Exam: Blood pressure (!) 148/86, pulse (!) 59, height 5\' 5"  (1.651 m), weight 160 lb (72.6 kg), SpO2 98 %.  GEN:  Well nourished, well developed in no acute distress HEENT: Normal NECK: No JVD; No carotid bruits LYMPHATICS: No lymphadenopathy CARDIAC: RR, no murmurs, rubs, gallops RESPIRATORY:  Clear to auscultation without rales, wheezing or rhonchi  ABDOMEN: Soft, non-tender, non-distended MUSCULOSKELETAL:  No edema; No deformity  SKIN: Warm and dry NEUROLOGIC:  Alert and oriented x 3   EKG: April 05, 2017: Sinus bradycardia at 59 beats a minute.  Otherwise normal EKG.  Recent Labs: No results found for requested labs within last 8760 hours.    Lipid Panel    Component Value Date/Time   CHOL 187 03/15/2015 1219   TRIG 83 03/15/2015 1219   HDL 89 03/15/2015 1219   CHOLHDL 2.1 03/15/2015 1219   VLDL 17 03/15/2015 1219   LDLCALC 81 03/15/2015 1219      Wt Readings from Last 3 Encounters:  04/05/17 160 lb (72.6 kg)  07/18/16 150 lb (68 kg)  03/30/16 152 lb 3.2 oz (69 kg)      Other studies Reviewed: Additional studies/ records that were reviewed today include: . Review of the above records demonstrates:     ASSESSMENT AND PLAN:  1.  Essential hypertension:    Blood pressures typically well controlled.  Continue current medications. Refill meds.  She had a reaction to the HCTZ tablet - she uses the microzide capsules and can tolerate those   We will see her again in 1 year.  Labs/ tests ordered today include: No orders of the defined types were placed in this encounter.   Mertie Moores, MD  04/05/2017 9:18 AM    [facility] [address], [place], [place]  [address] Phone: [phone]; Fax: [phone]    

## 2017-04-05 NOTE — Patient Instructions (Signed)

## 2017-05-10 ENCOUNTER — Other Ambulatory Visit: Payer: Self-pay | Admitting: Family Medicine

## 2017-05-10 DIAGNOSIS — N6012 Diffuse cystic mastopathy of left breast: Secondary | ICD-10-CM

## 2017-06-28 ENCOUNTER — Ambulatory Visit
Admission: RE | Admit: 2017-06-28 | Discharge: 2017-06-28 | Disposition: A | Discharge status: Home / Self Care | Payer: PPO | Source: Ambulatory Visit | Attending: Family Medicine | Admitting: Family Medicine

## 2017-06-28 DIAGNOSIS — R928 Other abnormal and inconclusive findings on diagnostic imaging of breast: Secondary | ICD-10-CM | POA: Diagnosis not present

## 2017-06-28 DIAGNOSIS — N6012 Diffuse cystic mastopathy of left breast: Secondary | ICD-10-CM

## 2017-08-19 ENCOUNTER — Telehealth: Payer: Self-pay | Admitting: Cardiovascular Disease

## 2017-08-19 DIAGNOSIS — I10 Essential (primary) hypertension: Secondary | ICD-10-CM

## 2017-08-19 NOTE — Telephone Encounter (Signed)
New Message:      Pt c/o BP issue: STAT if pt c/o blurred vision, one-sided weakness or slurred speech  1. What are your last 5 BP readings? 153/102: this afternoon 157/102:last night 153/93: this morning  2. Are you having any other symptoms (ex. Dizziness, headache, blurred vision, passed out)? Severe headache  3. What is your BP issue? Pt states her bp has been rising for the last couple of weeks.

## 2017-08-19 NOTE — Telephone Encounter (Signed)
The patient states her BP has been steadily increasing for the last few days. She has been checking her BP when she first wakes up in the morning (prior to taking her medications) because she "just knows it's going to be elevated."  She also says she headaches due to her elevated BP. The headaches are being treated and controlled with Tylenol.  Her BP has been running 150s/100-102.  Lately, she has been splitting her Microzide and taking 12.5 mg in the morning and 12.5 mg in the afternoon to see if this will help her morning BP readings, but she is up all night using the bathroom. The patient took Losartan 100 mg and HCTZ 12.5 mg this morning.  She will take HCTZ 25 mg now.  She will take entire HCTZ 25 mg dose in the morning and will try taking Losartan at night to have better BP control in the mornings.   She will continue to monitor BP and symptoms and will await Dr. Elmarie Shiley medication recommendations.

## 2017-08-20 NOTE — Telephone Encounter (Signed)
Spoke with pt to check on her today after changing up her medicine times.  Pt states BP has been good today (did not have readings nearby).  Also states HA has improved today and she is feeling better.  Advised pt to continue with that plan and once we hear back from Dr. Acie Fredrickson we would call back with his recommendations.  Pt appreciative for call.

## 2017-08-22 NOTE — Telephone Encounter (Signed)
I agree with Lenice Llamas , RN about taking the Losartan at night and the HCTZ in the am We need a current BMP.  ( last one was in 2017) If her potassium is low, that is a hint that she has a high renin/ high aldosterone  HTN and may respond well  to Spironolactone

## 2017-08-23 NOTE — Telephone Encounter (Signed)
Called patient to see how she is doing since medication therapy adjustment and she states she is feeling better. She states taking the HCTZ 25 mg in the morning seems to be better for her and her BP readings have been better and no headache. I advised her that Dr. Acie Fredrickson would like a bmet and she will come by tomorrow for lab work. She thanked me for the call.

## 2017-08-24 ENCOUNTER — Other Ambulatory Visit: Payer: PPO | Admitting: *Deleted

## 2017-08-24 DIAGNOSIS — I10 Essential (primary) hypertension: Secondary | ICD-10-CM

## 2017-08-24 DIAGNOSIS — G4700 Insomnia, unspecified: Secondary | ICD-10-CM | POA: Diagnosis not present

## 2017-08-24 LAB — BASIC METABOLIC PANEL WITH GFR
BUN/Creatinine Ratio: 17 (ref 12–28)
BUN: 11 mg/dL (ref 8–27)
CO2: 27 mmol/L (ref 20–29)
Calcium: 9.7 mg/dL (ref 8.7–10.3)
Chloride: 100 mmol/L (ref 96–106)
Creatinine, Ser: 0.66 mg/dL (ref 0.57–1.00)
GFR calc Af Amer: 104 mL/min/{1.73_m2}
GFR calc non Af Amer: 90 mL/min/{1.73_m2}
Glucose: 84 mg/dL (ref 65–99)
Potassium: 4.3 mmol/L (ref 3.5–5.2)
Sodium: 139 mmol/L (ref 134–144)

## 2017-08-26 NOTE — Telephone Encounter (Signed)
Patient's lab work is stable, no changes in medical therapy per Dr. Acie Fredrickson

## 2017-08-30 DIAGNOSIS — H40013 Open angle with borderline findings, low risk, bilateral: Secondary | ICD-10-CM | POA: Diagnosis not present

## 2017-10-15 ENCOUNTER — Emergency Department
Admission: EM | Admit: 2017-10-15 | Discharge: 2017-10-15 | Disposition: A | Discharge status: Home / Self Care | Payer: PPO | Attending: Emergency Medicine | Admitting: Emergency Medicine

## 2017-10-15 ENCOUNTER — Other Ambulatory Visit: Payer: Self-pay

## 2017-10-15 ENCOUNTER — Emergency Department: Payer: PPO

## 2017-10-15 ENCOUNTER — Encounter: Payer: Self-pay | Admitting: Emergency Medicine

## 2017-10-15 DIAGNOSIS — R9431 Abnormal electrocardiogram [ECG] [EKG]: Secondary | ICD-10-CM | POA: Diagnosis not present

## 2017-10-15 DIAGNOSIS — R0789 Other chest pain: Secondary | ICD-10-CM

## 2017-10-15 DIAGNOSIS — M549 Dorsalgia, unspecified: Secondary | ICD-10-CM | POA: Diagnosis not present

## 2017-10-15 DIAGNOSIS — Z7982 Long term (current) use of aspirin: Secondary | ICD-10-CM | POA: Diagnosis not present

## 2017-10-15 DIAGNOSIS — R079 Chest pain, unspecified: Secondary | ICD-10-CM | POA: Diagnosis present

## 2017-10-15 DIAGNOSIS — I10 Essential (primary) hypertension: Secondary | ICD-10-CM | POA: Diagnosis not present

## 2017-10-15 DIAGNOSIS — R0602 Shortness of breath: Secondary | ICD-10-CM | POA: Diagnosis not present

## 2017-10-15 DIAGNOSIS — Z79899 Other long term (current) drug therapy: Secondary | ICD-10-CM | POA: Diagnosis not present

## 2017-10-15 DIAGNOSIS — R1012 Left upper quadrant pain: Secondary | ICD-10-CM | POA: Diagnosis not present

## 2017-10-15 LAB — CBC WITH DIFFERENTIAL/PLATELET
Basophils Absolute: 0 10*3/uL (ref 0–0.1)
Basophils Relative: 2 %
Eosinophils Absolute: 0.1 10*3/uL (ref 0–0.7)
Eosinophils Relative: 2 %
HCT: 37.9 % (ref 35.0–47.0)
Hemoglobin: 12.7 g/dL (ref 12.0–16.0)
Lymphocytes Relative: 45 %
Lymphs Abs: 1.4 10*3/uL (ref 1.0–3.6)
MCH: 29.5 pg (ref 26.0–34.0)
MCHC: 33.6 g/dL (ref 32.0–36.0)
MCV: 87.8 fL (ref 80.0–100.0)
Monocytes Absolute: 0.3 10*3/uL (ref 0.2–0.9)
Monocytes Relative: 9 %
Neutro Abs: 1.3 10*3/uL — ABNORMAL LOW (ref 1.4–6.5)
Neutrophils Relative %: 42 %
Platelets: 298 10*3/uL (ref 150–440)
RBC: 4.32 MIL/uL (ref 3.80–5.20)
RDW: 14.3 % (ref 11.5–14.5)
WBC: 3.1 10*3/uL — ABNORMAL LOW (ref 3.6–11.0)

## 2017-10-15 LAB — COMPREHENSIVE METABOLIC PANEL WITH GFR
ALT: 15 U/L (ref 14–54)
AST: 26 U/L (ref 15–41)
Albumin: 4.1 g/dL (ref 3.5–5.0)
Alkaline Phosphatase: 77 U/L (ref 38–126)
Anion gap: 9 (ref 5–15)
BUN: 10 mg/dL (ref 6–20)
CO2: 28 mmol/L (ref 22–32)
Calcium: 9.5 mg/dL (ref 8.9–10.3)
Chloride: 99 mmol/L — ABNORMAL LOW (ref 101–111)
Creatinine, Ser: 0.53 mg/dL (ref 0.44–1.00)
GFR calc Af Amer: >60 mL/min
GFR calc non Af Amer: >60 mL/min
Glucose, Bld: 92 mg/dL (ref 65–99)
Potassium: 4.1 mmol/L (ref 3.5–5.1)
Sodium: 136 mmol/L (ref 135–145)
Total Bilirubin: 0.8 mg/dL (ref 0.3–1.2)
Total Protein: 7.3 g/dL (ref 6.5–8.1)

## 2017-10-15 LAB — TROPONIN I: Troponin I: <0.03 ng/mL

## 2017-10-15 LAB — BRAIN NATRIURETIC PEPTIDE: B Natriuretic Peptide: 21 pg/mL (ref 0.0–100.0)

## 2017-10-15 MED ORDER — IBUPROFEN 600 MG PO TABS: 600.0000 mg | ORAL_TABLET | Freq: Three times a day (TID) | ORAL | 0 refills | Status: DC | PRN

## 2017-10-15 MED ORDER — FENTANYL CITRATE (PF) 100 MCG/2ML IJ SOLN
50.0000 ug | Freq: Once | INTRAMUSCULAR | Status: AC
  Administered 2017-10-15: 50 ug via INTRAVENOUS
  Filled 2017-10-15: qty 2

## 2017-10-15 MED ORDER — IOPAMIDOL (ISOVUE-370) INJECTION 76%
75.0000 mL | Freq: Once | INTRAVENOUS | Status: AC | PRN
  Administered 2017-10-15: 100 mL via INTRAVENOUS

## 2017-10-15 MED ORDER — METHOCARBAMOL 500 MG PO TABS: 500.0000 mg | ORAL_TABLET | Freq: Four times a day (QID) | ORAL | 0 refills | Status: DC

## 2017-10-15 MED ORDER — ASPIRIN 81 MG PO CHEW: 162.0000 mg | CHEWABLE_TABLET | Freq: Once | ORAL | Status: DC

## 2017-10-15 MED ORDER — KETOROLAC TROMETHAMINE 30 MG/ML IJ SOLN
15.0000 mg | Freq: Once | INTRAMUSCULAR | Status: AC
  Administered 2017-10-15: 15 mg via INTRAVENOUS
  Filled 2017-10-15: qty 1

## 2017-10-15 NOTE — ED Triage Notes (Signed)
Says left side pain goes to left upper back and pain is worse with breathing for 3 -4 weeks .  Went to [facility] and was brought here by ems for ekg changes.

## 2017-10-15 NOTE — ED Notes (Signed)

## 2017-10-15 NOTE — ED Provider Notes (Signed)
[facility] Emergency Department Provider Note  ____________________________________________   First MD Initiated Contact with Patient 10/15/17 1257     (approximate)  I have reviewed the triage vital signs and the nursing notes.   HISTORY  Chief Complaint Chest Pain   HPI Caitlin Walker is a 72 y.o. female who sent to the emergency department via EMS from urgent care for chest pain and abnormal EKG.  The patient's pain is in her left lateral chest and left low back.  The pain is moderate to severe is constant worse with deep inspiration and has been present for 2 to 3 weeks.  Associated with some mild shortness of breath.  According to chart review the abnormal EKG was ST elevation in V1 V2 V3.  Patient symptoms are nonexertional.  She is had no leg swelling.  She did travel recently but no recent surgery.  No hemoptysis.  No leg swelling.  No history of DVT or pulmonary embolism.  No cough.  No fevers or chills.  Her pain is nonradiating.  No abdominal pain nausea or vomiting.  No dysuria or hematuria.  Past Medical History:  Diagnosis Date  . Chronic leukopenia DX  2011   MILD--  HEMATOLOGIST  --  DR Beryle Beams  . H/O hiatal hernia    TINY PER EGD 2007  . Hematuria   . Hemorrhoids    INTERNAL AND EXTERNAL   . History of colon polyps    BENIGN  . Hyperlipidemia   . Hypertension   . Ureterocele    LEFT  . Wears contact lenses     Patient Active Problem List   Diagnosis Date Noted  . Cough variant asthma vs UCAS 11/04/2015  . Leukopenia 11/02/2012  . Chest pain 11/20/2010  . Hypertension 08/14/2010    Past Surgical History:  Procedure Laterality Date  . APPENDECTOMY  1989  . BREAST BIOPSY Left   . COLONOSCOPY N/A 08/01/2012   Procedure: COLONOSCOPY;  Surgeon: Jeryl Columbia, MD;  Location: [facility];  Service: Endoscopy;  Laterality: N/A;  pt. would like dan to do procedure  . CYSTO/  URETHRAL DILATION /  RIGHT RETROGRADE PYELOGRAM/ RIGHT  URETEROSCOPY/  INSTILLATION THERAPY  06-18-2008  . CYSTOSCOPY/RETROGRADE/URETEROSCOPY Left 10/26/2013   Procedure: CYSTOSCOPY/RETROGRADE/DIAGNOSTIC DIGITAL URETEROSCOPY WITH LEFT URETERAL STENT PLACEMENT.;  Surgeon: Alexis Frock, MD;  Location: [facility];  Service: Urology;  Laterality: Left;  . DILATION AND CURETTAGE OF UTERUS  2001  . SUPRACERVICAL ABDOMINAL HYSTERECTOMY  02-25-2000   W/   BILATERAL SALPINGOOPHORECTOMY AND RECTOCELE REPAIR    Prior to Admission medications   Medication Sig Start Date End Date Taking? Authorizing Provider  aspirin 81 MG tablet Take 81 mg by mouth every evening.     [provider]  calcium carbonate (OS-CAL) 600 MG TABS Take 600 mg by mouth daily with breakfast.     [provider]  hydrochlorothiazide (MICROZIDE) 12.5 MG capsule Take 2 capsules (25 mg total) by mouth every morning. 04/05/17   Nahser, Wonda Cheng, MD  ibuprofen (ADVIL,MOTRIN) 600 MG tablet Take 1 tablet (600 mg total) by mouth every 8 (eight) hours as needed. 10/15/17   Darel Hong, MD  losartan (COZAAR) 100 MG tablet Take 1 tablet (100 mg total) by mouth daily. 04/05/17   Nahser, Wonda Cheng, MD  magnesium oxide (MAG-OX) 400 MG tablet Take 400 mg by mouth every evening.     [provider]  methocarbamol (ROBAXIN) 500 MG tablet Take 1 tablet (  500 mg total) by mouth 4 (four) times daily. 10/15/17   Darel Hong, MD  potassium chloride (K-DUR,KLOR-CON) 10 MEQ tablet Take 1 tablet (10 mEq total) by mouth daily. 04/05/17   Nahser, Wonda Cheng, MD  zolpidem (AMBIEN CR) 12.5 MG CR tablet Take 1 tablet (12.5 mg total) by mouth at bedtime as needed for sleep. 08/14/10   Nahser, Wonda Cheng, MD    Allergies Amlodipine; Diovan [valsartan]; Prednisone; Bactrim; Flomax [tamsulosin hcl]; and Sulfa antibiotics  Family History  Problem Relation Age of Onset  . Heart disease Father   . Hypertension Father   . Emphysema Father        smoked  . Hypertension Sister   .  Hyperlipidemia Sister   . Hypertension Brother   . Hypertension Sister   . Colon cancer Sister   . Hypertension Sister   . Hypertension Sister   . Hypertension Sister   . Hyperlipidemia Sister   . Hypertension Brother   . Hypertension Brother   . Hyperlipidemia Brother     Social History Social History   Tobacco Use  . Smoking status: Never Smoker  . Smokeless tobacco: Never Used  Substance Use Topics  . Alcohol use: No  . Drug use: No    Review of Systems Constitutional: No fever/chills Eyes: No visual changes. ENT: No sore throat. Cardiovascular: Positive for chest pain. Respiratory: Positive for shortness of breath. Gastrointestinal: No abdominal pain.  No nausea, no vomiting.  No diarrhea.  No constipation. Genitourinary: Negative for dysuria. Musculoskeletal: Negative for back pain. Skin: Negative for rash. Neurological: Negative for headaches, focal weakness or numbness.   ____________________________________________   PHYSICAL EXAM:  VITAL SIGNS: ED Triage Vitals  Enc Vitals Group     BP 10/15/17 1237 135/81     Pulse Rate 10/15/17 1237 75     Resp 10/15/17 1237 16     Temp 10/15/17 1237 97.9 F (36.6 C)     Temp Source 10/15/17 1237 Oral     SpO2 10/15/17 1237 100 %     Weight 10/15/17 1234 160 lb (72.6 kg)     Height 10/15/17 1234 5\' 4"  (1.626 m)     Head Circumference --      Peak Flow --      Pain Score 10/15/17 1232 7     Pain Loc --      Pain Edu? --      Excl. in [place]? --     Constitutional: Alert and oriented x4 appears uncomfortable leaning forward splinting her left side Eyes: PERRL EOMI. Head: Atraumatic. Nose: No congestion/rhinnorhea. Mouth/Throat: No trismus Neck: No stridor.   Cardiovascular: Normal rate, regular rhythm. Grossly normal heart sounds.  Good peripheral circulation. Respiratory: Lightly increased respiratory effort.  No retractions. Lungs CTAB and moving good air Gastrointestinal: Soft nontender.  No peritonitis.   No costovertebral tenderness Musculoskeletal: No lower extremity edema   Neurologic:  Normal speech and language. No gross focal neurologic deficits are appreciated. Skin:  Skin is warm, dry and intact. No rash noted. Psychiatric: Mildly anxious appearing.    ____________________________________________   DIFFERENTIAL includes but not limited to  Pulmonary embolism, pneumothorax, pneumonia, acute coronary syndrome ____________________________________________   LABS (all labs ordered are listed, but only abnormal results are displayed)  Labs Reviewed  COMPREHENSIVE METABOLIC PANEL - Abnormal; Notable for the following components:      Result Value   Chloride 99 (*)    All other components within normal limits  CBC WITH DIFFERENTIAL/PLATELET -  Abnormal; Notable for the following components:   WBC 3.1 (*)    Neutro Abs 1.3 (*)    All other components within normal limits  TROPONIN I  BRAIN NATRIURETIC PEPTIDE    Lab work reviewed by me with no signs of acute ischemic __________________________________________  EKG  ED ECG REPORT I, Darel Hong, the attending physician, personally viewed and interpreted this ECG.  Date: 10/15/2017 EKG Time:  Rate: 68 Rhythm: normal sinus rhythm QRS Axis: normal Intervals: normal ST/T Wave abnormalities: normal Narrative Interpretation: no evidence of acute ischemia  ____________________________________________  RADIOLOGY  CT angiogram reviewed by me with no acute disease ____________________________________________   PROCEDURES  Procedure(s) performed: no  Procedures  Critical Care performed: no  Observation: no ____________________________________________   INITIAL IMPRESSION / ASSESSMENT AND PLAN / ED COURSE  Pertinent labs & imaging results that were available during my care of the patient were reviewed by me and considered in my medical decision making (see chart for details).  I reviewed the urgent care EKG  and I disagree with the physician's interpretation of ST elevation.  This is a normal EKG earlier today and a normal EKG now.  The patient's pain is 2 to 3 weeks and pleuritic I am actually more concerned about a pulmonary embolism.  She is too old to use the [facility] rule and she has at least moderate risk so I cannot use a d-dimer.  CT angiogram is pending.     ----------------------------------------- 4:24 PM on 10/15/2017 -----------------------------------------  The patient's pain is down to a 3 out of 10.  CT scan is reassuring.  I do lengthy discussion with the patient family regarding the diagnostic uncertainty however I felt that she was stable for outpatient management.  She verbalized understanding agreement the plan.  Strict return precautions have been given. ____________________________________________   FINAL CLINICAL IMPRESSION(S) / ED DIAGNOSES  Final diagnoses:  Atypical chest pain      NEW MEDICATIONS STARTED DURING THIS VISIT:  Discharge Medication List as of 10/15/2017  4:24 PM    START taking these medications   Details  ibuprofen (ADVIL,MOTRIN) 600 MG tablet Take 1 tablet (600 mg total) by mouth every 8 (eight) hours as needed., Starting Fri 10/15/2017, Print    methocarbamol (ROBAXIN) 500 MG tablet Take 1 tablet (500 mg total) by mouth 4 (four) times daily., Starting Fri 10/15/2017, Print         Note:  This document was prepared using Dragon voice recognition software and may include unintentional dictation errors.     Darel Hong, MD 10/16/17 1344

## 2017-10-15 NOTE — Discharge Instructions (Signed)
Alternately today your CT scan was reassuring.  Please begin taking your anti-inflammatory medication as prescribed and follow-up with your primary care physician this coming Monday for reevaluation.  Return to the emergency department sooner for any concerns.  It was a pleasure to take care of you today, and thank you for coming to our emergency department.  If you have any questions or concerns before leaving please ask the nurse to grab me and I'm more than happy to go through your aftercare instructions again.  If you were prescribed any opioid pain medication today such as Norco, Vicodin, Percocet, morphine, hydrocodone, or oxycodone please make sure you do not drive when you are taking this medication as it can alter your ability to drive safely.  If you have any concerns once you are home that you are not improving or are in fact getting worse before you can make it to your follow-up appointment, please do not hesitate to call 911 and come back for further evaluation.  Darel Hong, MD  Results for orders placed or performed during the hospital encounter of 10/15/17  Comprehensive metabolic panel  Result Value Ref Range   Sodium 136 135 - 145 mmol/L   Potassium 4.1 3.5 - 5.1 mmol/L   Chloride 99 (L) 101 - 111 mmol/L   CO2 28 22 - 32 mmol/L   Glucose, Bld 92 65 - 99 mg/dL   BUN 10 6 - 20 mg/dL   Creatinine, Ser 0.53 0.44 - 1.00 mg/dL   Calcium 9.5 8.9 - 10.3 mg/dL   Total Protein 7.3 6.5 - 8.1 g/dL   Albumin 4.1 3.5 - 5.0 g/dL   AST 26 15 - 41 U/L   ALT 15 14 - 54 U/L   Alkaline Phosphatase 77 38 - 126 U/L   Total Bilirubin 0.8 0.3 - 1.2 mg/dL   GFR calc non Af Amer >60 >60 mL/min   GFR calc Af Amer >60 >60 mL/min   Anion gap 9 5 - 15  Troponin I  Result Value Ref Range   Troponin I <0.03 <0.03 ng/mL  CBC with Differential  Result Value Ref Range   WBC 3.1 (L) 3.6 - 11.0 K/uL   RBC 4.32 3.80 - 5.20 MIL/uL   Hemoglobin 12.7 12.0 - 16.0 g/dL   HCT 37.9 35.0 - 47.0 %   MCV  87.8 80.0 - 100.0 fL   MCH 29.5 26.0 - 34.0 pg   MCHC 33.6 32.0 - 36.0 g/dL   RDW 14.3 11.5 - 14.5 %   Platelets 298 150 - 440 K/uL   Neutrophils Relative % 42 %   Neutro Abs 1.3 (L) 1.4 - 6.5 K/uL   Lymphocytes Relative 45 %   Lymphs Abs 1.4 1.0 - 3.6 K/uL   Monocytes Relative 9 %   Monocytes Absolute 0.3 0.2 - 0.9 K/uL   Eosinophils Relative 2 %   Eosinophils Absolute 0.1 0 - 0.7 K/uL   Basophils Relative 2 %   Basophils Absolute 0.0 0 - 0.1 K/uL  Brain natriuretic peptide  Result Value Ref Range   B Natriuretic Peptide 21.0 0.0 - 100.0 pg/mL   Dg Chest 2 View  Result Date: 10/15/2017 CLINICAL DATA:  Pt states she has had tightness and heaviness in her chest for 3 weeks. Sharp pain in her chest when taking in deep breaths. Hx HTN EXAM: CHEST - 2 VIEW COMPARISON:  08/29/2015 FINDINGS: Lungs clear, mildly hyperinflated. Heart size and mediastinal contours are within normal limits. No effusion.  No pneumothorax. Visualized bones unremarkable. IMPRESSION: No acute cardiopulmonary disease. Electronically Signed   By: Lucrezia Europe M.D.   On: 10/15/2017 13:33   Ct Angio Chest Pe W/cm &/or Wo Cm  Result Date: 10/15/2017 CLINICAL DATA:  Left chest pain radiating to the upper back, worse with inspiration. Symptoms for the past 3-4 weeks. EXAM: CT ANGIOGRAPHY CHEST WITH CONTRAST TECHNIQUE: Multidetector CT imaging of the chest was performed using the standard protocol during bolus administration of intravenous contrast. Multiplanar CT image reconstructions and MIPs were obtained to evaluate the vascular anatomy. CONTRAST:  143mL ISOVUE-370 IOPAMIDOL (ISOVUE-370) INJECTION 76% COMPARISON:  Chest x-ray from same day. CTA chest dated August 29, 2005. FINDINGS: Cardiovascular: Satisfactory opacification of the pulmonary arteries to the segmental level. No evidence of pulmonary embolism. Normal heart size. No pericardial effusion. Normal caliber thoracic aorta. No aortic dissection. Minimal atherosclerotic  calcification of the aortic arch. Mediastinum/Nodes: No enlarged mediastinal, hilar, or axillary lymph nodes. Thyroid gland, trachea, and esophagus demonstrate no significant findings. Lungs/Pleura: Mild bilateral lower lobe subsegmental atelectasis. No focal consolidation, pleural effusion, or pneumothorax. No suspicious pulmonary nodule. Upper Abdomen: No acute abnormality. Musculoskeletal: No chest wall abnormality. No acute or significant osseous findings. Review of the MIP images confirms the above findings. IMPRESSION: 1. No evidence of pulmonary embolism. No acute intrathoracic process. Electronically Signed   By: Titus Dubin M.D.   On: 10/15/2017 15:08

## 2017-10-21 DIAGNOSIS — R079 Chest pain, unspecified: Secondary | ICD-10-CM | POA: Diagnosis not present

## 2017-10-21 DIAGNOSIS — M6289 Other specified disorders of muscle: Secondary | ICD-10-CM | POA: Diagnosis not present

## 2017-11-05 ENCOUNTER — Other Ambulatory Visit: Payer: Self-pay | Admitting: Family Medicine

## 2017-11-05 DIAGNOSIS — Z1231 Encounter for screening mammogram for malignant neoplasm of breast: Secondary | ICD-10-CM

## 2017-11-26 DIAGNOSIS — N3001 Acute cystitis with hematuria: Secondary | ICD-10-CM | POA: Diagnosis not present

## 2017-12-09 DIAGNOSIS — Z6827 Body mass index (BMI) 27.0-27.9, adult: Secondary | ICD-10-CM | POA: Diagnosis not present

## 2017-12-09 DIAGNOSIS — Z124 Encounter for screening for malignant neoplasm of cervix: Secondary | ICD-10-CM | POA: Diagnosis not present

## 2017-12-24 ENCOUNTER — Ambulatory Visit
Admission: RE | Admit: 2017-12-24 | Discharge: 2017-12-24 | Disposition: A | Discharge status: Home / Self Care | Payer: PPO | Source: Ambulatory Visit | Attending: Family Medicine | Admitting: Family Medicine

## 2017-12-24 DIAGNOSIS — Z1231 Encounter for screening mammogram for malignant neoplasm of breast: Secondary | ICD-10-CM | POA: Diagnosis not present

## 2018-03-28 DIAGNOSIS — G4700 Insomnia, unspecified: Secondary | ICD-10-CM | POA: Diagnosis not present

## 2018-03-28 DIAGNOSIS — Z0000 Encounter for general adult medical examination without abnormal findings: Secondary | ICD-10-CM | POA: Diagnosis not present

## 2018-03-28 DIAGNOSIS — Z1389 Encounter for screening for other disorder: Secondary | ICD-10-CM | POA: Diagnosis not present

## 2018-03-28 DIAGNOSIS — Z1331 Encounter for screening for depression: Secondary | ICD-10-CM | POA: Diagnosis not present

## 2018-03-28 DIAGNOSIS — Z1159 Encounter for screening for other viral diseases: Secondary | ICD-10-CM | POA: Diagnosis not present

## 2018-03-28 DIAGNOSIS — E7800 Pure hypercholesterolemia, unspecified: Secondary | ICD-10-CM | POA: Diagnosis not present

## 2018-03-28 DIAGNOSIS — Z862 Personal history of diseases of the blood and blood-forming organs and certain disorders involving the immune mechanism: Secondary | ICD-10-CM | POA: Diagnosis not present

## 2018-03-28 DIAGNOSIS — Z13220 Encounter for screening for lipoid disorders: Secondary | ICD-10-CM | POA: Diagnosis not present

## 2018-03-28 DIAGNOSIS — I10 Essential (primary) hypertension: Secondary | ICD-10-CM | POA: Diagnosis not present

## 2018-04-05 ENCOUNTER — Other Ambulatory Visit: Payer: Self-pay | Admitting: Cardiovascular Disease

## 2018-04-11 ENCOUNTER — Encounter: Payer: Self-pay | Admitting: Cardiovascular Disease

## 2018-04-11 ENCOUNTER — Ambulatory Visit: Payer: PPO | Admitting: Cardiovascular Disease

## 2018-04-11 VITALS — BP 148/96 | HR 61 | Ht 64.0 in | Wt 160.0 lb

## 2018-04-11 DIAGNOSIS — I10 Essential (primary) hypertension: Secondary | ICD-10-CM | POA: Diagnosis not present

## 2018-04-11 MED ORDER — HYDRALAZINE HCL 10 MG PO TABS: 10.0000 mg | ORAL_TABLET | Freq: Three times a day (TID) | ORAL | 11 refills | Status: DC

## 2018-04-11 NOTE — Patient Instructions (Addendum)
Medication Instructions:  Your physician has recommended you make the following change in your medication:   START Hydralazine (Apresoline) 10 mg 3 times per day   If you need a refill on your cardiac medications before your next appointment, please call your pharmacy.   Lab work: None Ordered  If you have labs (blood work) drawn today and your tests are completely normal, you will receive your results only by: Marland Kitchen MyChart Message (if you have MyChart) OR . A paper copy in the mail If you have any lab test that is abnormal or we need to change your treatment, we will call you to review the results.  Testing/Procedures: None Ordered   Follow-Up: Your physician recommends that you schedule a follow-up appointment in: 1 month for BP Check/Nurse Visit on Friday January 10 at 11:00 am   At [facility], you and your health needs are our priority.  As part of our continuing mission to provide you with exceptional heart care, we have created designated Provider Care Teams.  These Care Teams include your primary Cardiologist (physician) and Advanced Practice Providers (APPs -  Physician Assistants and Nurse Practitioners) who all work together to provide you with the care you need, when you need it. You will need a follow up appointment in:  3 months.  Please call our office 2 months in advance to schedule this appointment.  You may see Mertie Moores, MD or one of the following Advanced Practice Providers on your designated Care Team: Richardson Dopp, PA-C [place], [place] . Daune Perch, NP

## 2018-04-11 NOTE — Progress Notes (Signed)
Cardiology Office Note   Date:  04/11/2018   ID:  CARESSE SEDIVY, DOB [birth year], MRN [id number]  PCP:  Lawerance Cruel, MD  Cardiologist:   Mertie Moores, MD   Chief Complaint  Patient presents with  . Hypertension   1. Hypertension 2. Insomnia   Caitlin Walker is a 72 year old female with a history of hypertension. She has done very well. She's not having episodes of chest pain or shortness of breath. She avoids eating any extra salt. She exercises several times a week.  She is having trouble with insomnia.  May 12, 2012: She has been doing well from a cardiac standpoint. She has had some lightheadness. She also has had some leg cramps. She has not been exercising. She has been watching her salt intake. She still works full time in the [employer].  Oct. 1, 2014:  Caitlin Walker is doing ok. Still has leg cramps. She has been busy - her was diagnosed with breast cancer. She is running around lots . No CP or dyspnea. Has occasional indigestion. She works in the [employer].   Nov. 12, 2014 She is doing ok  Sep 18, 2013:  Nov. 9, 2015:  Caitlin Walker is doing well.  She has retired. Has developed a neuroma on the bottom of her foot. BP has been a little higher.   Sep 17, 2014:   [name] is a 72 y.o. female who presents for HTN She is doing well. No CP,  BP has been well controlled.   Nov. 14, 2016:  Doing well.  Has had some GI bleeding - has not found the source yet.  Has had upper ECG.  CT , barium swallow .  Lipids have improved significantly   Oct 01, 2015: Caitlin Walker is seen back today for eval of her HTN.  Was diagnosed with Purtussis in Feb.   Still coughing  BP is well controlled.  Remains active   03/30/2016:  Has been a rough year.  Is separated from her husband . Has had lots of anxiety  BP has been well controlled.  Occasional , rare episodes of CP   April 05, 2017:  Doing well from a cardiac standpoint .    BP has been well controled.     Just did a 6 night stretch of work  ( 52 hours )  Feels well   April 11, 2018:  Caitlin Walker is seen today for follow-up of her hypertension.  Her blood pressure is mildly elevated today. Has not tolerated amlodipine, vasotec, Norvasc, bystolic,  Valsartan    Past Medical History:  Diagnosis Date  . Chronic leukopenia DX  2011   MILD--  HEMATOLOGIST  --  DR Beryle Beams  . H/O hiatal hernia    TINY PER EGD 2007  . Hematuria   . Hemorrhoids    INTERNAL AND EXTERNAL   . History of colon polyps    BENIGN  . Hyperlipidemia   . Hypertension   . Ureterocele    LEFT  . Wears contact lenses     Past Surgical History:  Procedure Laterality Date  . APPENDECTOMY  1989  . BREAST BIOPSY Left    benign  . COLONOSCOPY N/A 08/01/2012   Procedure: COLONOSCOPY;  Surgeon: Jeryl Columbia, MD;  Location: [facility];  Service: Endoscopy;  Laterality: N/A;  pt. would like dan to do procedure  . CYSTO/  URETHRAL DILATION /  RIGHT RETROGRADE PYELOGRAM/ RIGHT URETEROSCOPY/  INSTILLATION THERAPY  06-18-2008  . CYSTOSCOPY/RETROGRADE/URETEROSCOPY Left  10/26/2013   Procedure: CYSTOSCOPY/RETROGRADE/DIAGNOSTIC DIGITAL URETEROSCOPY WITH LEFT URETERAL STENT PLACEMENT.;  Surgeon: Alexis Frock, MD;  Location: [facility];  Service: Urology;  Laterality: Left;  . DILATION AND CURETTAGE OF UTERUS  2001  . SUPRACERVICAL ABDOMINAL HYSTERECTOMY  02-25-2000   W/   BILATERAL SALPINGOOPHORECTOMY AND RECTOCELE REPAIR     Current Outpatient Medications  Medication Sig Dispense Refill  . aspirin 81 MG tablet Take 81 mg by mouth every evening.     . calcium carbonate (OS-CAL) 600 MG TABS Take 600 mg by mouth daily with breakfast.     . hydrochlorothiazide (MICROZIDE) 12.5 MG capsule Take 12.5 mg by mouth 2 (two) times daily.    Marland Kitchen losartan (COZAAR) 100 MG tablet Take 1 tablet (100 mg total) by mouth daily. 90 tablet 3  . magnesium oxide (MAG-OX) 400 MG tablet Take 400 mg by mouth every evening.     .  potassium chloride (K-DUR,KLOR-CON) 10 MEQ tablet Take 1 tablet (10 mEq total) by mouth daily. 90 tablet 3  . zolpidem (AMBIEN) 10 MG tablet Take 1 tablet by mouth at bedtime as needed for sleep.  5  . hydrALAZINE (APRESOLINE) 10 MG tablet Take 1 tablet (10 mg total) by mouth 3 (three) times daily. 90 tablet 11   No current facility-administered medications for this visit.     Allergies:   Amlodipine; Bystolic [nebivolol hcl]; Diovan [valsartan]; Prednisone; Bactrim; Flomax [tamsulosin hcl]; and Sulfa antibiotics    Social History:  The patient  reports that she has never smoked. She has never used smokeless tobacco. She reports that she does not drink alcohol or use drugs.   Family History:  The patient's family history includes Colon cancer in her sister; Emphysema in her father; Heart disease in her father; Hyperlipidemia in her brother, sister, and sister; Hypertension in her brother, brother, brother, father, sister, sister, sister, sister, and sister.    ROS: As noted in the current history.  Otherwise the review of systems is negative.  Physical Exam: Blood pressure (!) 148/96, pulse 61, height 5\' 4"  (1.626 m), weight 160 lb (72.6 kg), SpO2 98 %.  GEN:  Well nourished, well developed in no acute distress HEENT: Normal NECK: No JVD; No carotid bruits LYMPHATICS: No lymphadenopathy CARDIAC: RRR  RESPIRATORY:  Clear to auscultation without rales, wheezing or rhonchi  ABDOMEN: Soft, non-tender, non-distended MUSCULOSKELETAL:  No edema; No deformity  SKIN: Warm and dry NEUROLOGIC:  Alert and oriented x 3   EKG:   Recent Labs: 10/15/2017: ALT 15; B Natriuretic Peptide 21.0; BUN 10; Creatinine, Ser 0.53; Hemoglobin 12.7; Platelets 298; Potassium 4.1; Sodium 136    Lipid Panel    Component Value Date/Time   CHOL 187 03/15/2015 1219   TRIG 83 03/15/2015 1219   HDL 89 03/15/2015 1219   CHOLHDL 2.1 03/15/2015 1219   VLDL 17 03/15/2015 1219   LDLCALC 81 03/15/2015 1219       Wt Readings from Last 3 Encounters:  04/11/18 160 lb (72.6 kg)  10/15/17 160 lb (72.6 kg)  04/05/17 160 lb (72.6 kg)      Other studies Reviewed: Additional studies/ records that were reviewed today include: . Review of the above records demonstrates:    ASSESSMENT AND PLAN:  1.  Essential hypertension:    Blood pressure is moderately elevated today.  She is intolerant to numerous medications.  We will try her on hydralazine 25 mg 3 times a day.  We can also consider adding Aldactone at her  next visit.  I encouraged her to watch her salt intake.  I encouraged her to exercise on a regular basis.  We will see her again in  3 months   Labs/ tests ordered today include: No orders of the defined types were placed in this encounter.   Mertie Moores, MD  04/11/2018 5:27 PM    [facility] [address], [place], [place]  [address] Phone: [phone]; Fax: [phone]

## 2018-04-22 DIAGNOSIS — L089 Local infection of the skin and subcutaneous tissue, unspecified: Secondary | ICD-10-CM | POA: Diagnosis not present

## 2018-04-22 DIAGNOSIS — W57XXXA Bitten or stung by nonvenomous insect and other nonvenomous arthropods, initial encounter: Secondary | ICD-10-CM | POA: Diagnosis not present

## 2018-05-13 ENCOUNTER — Ambulatory Visit (INDEPENDENT_AMBULATORY_CARE_PROVIDER_SITE_OTHER): Payer: PPO

## 2018-05-13 VITALS — BP 144/90 | HR 60 | Wt 163.0 lb

## 2018-05-13 DIAGNOSIS — I10 Essential (primary) hypertension: Secondary | ICD-10-CM

## 2018-05-13 MED ORDER — LOSARTAN POTASSIUM 25 MG PO TABS: ORAL_TABLET | ORAL | 3 refills | Status: DC

## 2018-05-13 NOTE — Patient Instructions (Addendum)
Medication Instructions:  Restart Losartan 50mg  a day... new dose  If you need a refill on your cardiac medications before your next appointment, please call your pharmacy.    Lab work: None ordered If you have labs (blood work) drawn today and your tests are completely normal, you will receive your results only by: Marland Kitchen MyChart Message (if you have MyChart) OR . A paper copy in the mail If you have any lab test that is abnormal or we need to change your treatment, we will call you to review the results.    Testing/Procedures:     Follow-Up:  Keep Follow up appointment with Pecolia Ades NP on July 12, 2018 at [place], you and your health needs are our priority.  As part of our continuing mission to provide you with exceptional heart care, we have created designated Provider Care Teams.  These Care Teams include your primary Cardiologist (physician) and Advanced Practice Providers (APPs -  Physician Assistants and Nurse Practitioners) who all work together to provide you with the care you need, when you need it.     Any Other Special Instructions Will Be Listed Below (If Applicable).

## 2018-05-13 NOTE — Progress Notes (Signed)
1.) Reason for visit: BP check from 04/11/18 OV   2.) Name of MD requesting visit: Dr. Acie Fredrickson*  3.) H&P: Pt seen 04/11/18 and due to elevated BP.. Dr. Acie Fredrickson added Hydralazine 10mg  po tid... Pt has stopped her Losartan 100 mg in error nit realizing that Dr. Acie Fredrickson was adding Hydralazine and not replacing Losartan.   4.) ROS related to problem: Pt denies headache, dizziness, chest pain.. says that she has been feeling well but her BP readings have been elevated..   5.) Assessment and plan per MD: per Dr. Acie Fredrickson.. pt instructed to restart new dose of Losartan 50mg  po qd and to continue to follow her BP readings and to let us know if still staying elevated.. to keep follow up appt with Pecolia Ades NP on 07/12/2018. Pt verbalized understanding and agreed.

## 2018-05-18 DIAGNOSIS — H5711 Ocular pain, right eye: Secondary | ICD-10-CM | POA: Diagnosis not present

## 2018-06-22 ENCOUNTER — Encounter: Payer: Self-pay | Admitting: Cardiology

## 2018-07-12 ENCOUNTER — Ambulatory Visit (INDEPENDENT_AMBULATORY_CARE_PROVIDER_SITE_OTHER): Payer: PPO | Admitting: Cardiology

## 2018-07-12 ENCOUNTER — Encounter: Payer: Self-pay | Admitting: Cardiology

## 2018-07-12 ENCOUNTER — Ambulatory Visit: Payer: PPO | Admitting: Cardiology

## 2018-07-12 VITALS — BP 124/84 | HR 70 | Ht 64.0 in | Wt 158.1 lb

## 2018-07-12 DIAGNOSIS — I10 Essential (primary) hypertension: Secondary | ICD-10-CM

## 2018-07-12 NOTE — Patient Instructions (Signed)
Medication Instructions:  Your physician recommends that you continue on your current medications as directed. Please refer to the Current Medication list given to you today.  If you need a refill on your cardiac medications before your next appointment, please call your pharmacy.   Lab work: None  If you have labs (blood work) drawn today and your tests are completely normal, you will receive your results only by: . MyChart Message (if you have MyChart) OR . A paper copy in the mail If you have any lab test that is abnormal or we need to change your treatment, we will call you to review the results.  Testing/Procedures: None  Follow-Up: At [facility], you and your health needs are our priority.  As part of our continuing mission to provide you with exceptional heart care, we have created designated Provider Care Teams.  These Care Teams include your primary Cardiologist (physician) and Advanced Practice Providers (APPs -  Physician Assistants and Nurse Practitioners) who all work together to provide you with the care you need, when you need it. You will need a follow up appointment in:  6 months.  Please call our office 2 months in advance to schedule this appointment.  You may see Philip Nahser, MD or one of the following Advanced Practice Providers on your designated Care Team: Scott Weaver, PA-C Vin Bhagat, PA-C . Janine Hammond, NP  Any Other Special Instructions Will Be Listed Below (If Applicable).   DASH Eating Plan DASH stands for "Dietary Approaches to Stop Hypertension." The DASH eating plan is a healthy eating plan that has been shown to reduce high blood pressure (hypertension). It may also reduce your risk for type 2 diabetes, heart disease, and stroke. The DASH eating plan may also help with weight loss. What are tips for following this plan?  General guidelines  Avoid eating more than 2,300 mg (milligrams) of salt (sodium) a day. If you have hypertension, you may  need to reduce your sodium intake to 1,500 mg a day.  Limit alcohol intake to no more than 1 drink a day for nonpregnant women and 2 drinks a day for men. One drink equals 12 oz of beer, 5 oz of wine, or 1 oz of hard liquor.  Work with your health care provider to maintain a healthy body weight or to lose weight. Ask what an ideal weight is for you.  Get at least 30 minutes of exercise that causes your heart to beat faster (aerobic exercise) most days of the week. Activities may include walking, swimming, or biking.  Work with your health care provider or diet and nutrition specialist (dietitian) to adjust your eating plan to your individual calorie needs. Reading food labels   Check food labels for the amount of sodium per serving. Choose foods with less than 5 percent of the Daily Value of sodium. Generally, foods with less than 300 mg of sodium per serving fit into this eating plan.  To find whole grains, look for the word "whole" as the first word in the ingredient list. Shopping  Buy products labeled as "low-sodium" or "no salt added."  Buy fresh foods. Avoid canned foods and premade or frozen meals. Cooking  Avoid adding salt when cooking. Use salt-free seasonings or herbs instead of table salt or sea salt. Check with your health care provider or pharmacist before using salt substitutes.  Do not fry foods. Cook foods using healthy methods such as baking, boiling, grilling, and broiling instead.  Cook with heart-healthy   oils, such as olive, canola, soybean, or sunflower oil. Meal planning  Eat a balanced diet that includes: ? 5 or more servings of fruits and vegetables each day. At each meal, try to fill half of your plate with fruits and vegetables. ? Up to 6-8 servings of whole grains each day. ? Less than 6 oz of lean meat, poultry, or fish each day. A 3-oz serving of meat is about the same size as a deck of cards. One egg equals 1 oz. ? 2 servings of low-fat dairy each day.  ? A serving of nuts, seeds, or beans 5 times each week. ? Heart-healthy fats. Healthy fats called Omega-3 fatty acids are found in foods such as flaxseeds and coldwater fish, like sardines, salmon, and mackerel.  Limit how much you eat of the following: ? Canned or prepackaged foods. ? Food that is high in trans fat, such as fried foods. ? Food that is high in saturated fat, such as fatty meat. ? Sweets, desserts, sugary drinks, and other foods with added sugar. ? Full-fat dairy products.  Do not salt foods before eating.  Try to eat at least 2 vegetarian meals each week.  Eat more home-cooked food and less restaurant, buffet, and fast food.  When eating at a restaurant, ask that your food be prepared with less salt or no salt, if possible. What foods are recommended? The items listed may not be a complete list. Talk with your dietitian about what dietary choices are best for you. Grains Whole-grain or whole-wheat bread. Whole-grain or whole-wheat pasta. Brown rice. Oatmeal. Quinoa. Bulgur. Whole-grain and low-sodium cereals. Pita bread. Low-fat, low-sodium crackers. Whole-wheat flour tortillas. Vegetables Fresh or frozen vegetables (raw, steamed, roasted, or grilled). Low-sodium or reduced-sodium tomato and vegetable juice. Low-sodium or reduced-sodium tomato sauce and tomato paste. Low-sodium or reduced-sodium canned vegetables. Fruits All fresh, dried, or frozen fruit. Canned fruit in natural juice (without added sugar). Meat and other protein foods Skinless chicken or turkey. Ground chicken or turkey. Pork with fat trimmed off. Fish and seafood. Egg whites. Dried beans, peas, or lentils. Unsalted nuts, nut butters, and seeds. Unsalted canned beans. Lean cuts of beef with fat trimmed off. Low-sodium, lean deli meat. Dairy Low-fat (1%) or fat-free (skim) milk. Fat-free, low-fat, or reduced-fat cheeses. Nonfat, low-sodium ricotta or cottage cheese. Low-fat or nonfat yogurt. Low-fat,  low-sodium cheese. Fats and oils Soft margarine without trans fats. Vegetable oil. Low-fat, reduced-fat, or light mayonnaise and salad dressings (reduced-sodium). Canola, safflower, olive, soybean, and sunflower oils. Avocado. Seasoning and other foods Herbs. Spices. Seasoning mixes without salt. Unsalted popcorn and pretzels. Fat-free sweets. What foods are not recommended? The items listed may not be a complete list. Talk with your dietitian about what dietary choices are best for you. Grains Baked goods made with fat, such as croissants, muffins, or some breads. Dry pasta or rice meal packs. Vegetables Creamed or fried vegetables. Vegetables in a cheese sauce. Regular canned vegetables (not low-sodium or reduced-sodium). Regular canned tomato sauce and paste (not low-sodium or reduced-sodium). Regular tomato and vegetable juice (not low-sodium or reduced-sodium). Pickles. Olives. Fruits Canned fruit in a light or heavy syrup. Fried fruit. Fruit in cream or butter sauce. Meat and other protein foods Fatty cuts of meat. Ribs. Fried meat. Bacon. Sausage. Bologna and other processed lunch meats. Salami. Fatback. Hotdogs. Bratwurst. Salted nuts and seeds. Canned beans with added salt. Canned or smoked fish. Whole eggs or egg yolks. Chicken or turkey with skin. Dairy Whole or 2% milk,   cream, and half-and-half. Whole or full-fat cream cheese. Whole-fat or sweetened yogurt. Full-fat cheese. Nondairy creamers. Whipped toppings. Processed cheese and cheese spreads. Fats and oils Butter. Stick margarine. Lard. Shortening. Ghee. Bacon fat. Tropical oils, such as coconut, palm kernel, or palm oil. Seasoning and other foods Salted popcorn and pretzels. Onion salt, garlic salt, seasoned salt, table salt, and sea salt. Worcestershire sauce. Tartar sauce. Barbecue sauce. Teriyaki sauce. Soy sauce, including reduced-sodium. Steak sauce. Canned and packaged gravies. Fish sauce. Oyster sauce. Cocktail sauce.  Horseradish that you find on the shelf. Ketchup. Mustard. Meat flavorings and tenderizers. Bouillon cubes. Hot sauce and Tabasco sauce. Premade or packaged marinades. Premade or packaged taco seasonings. Relishes. Regular salad dressings. Where to find more information:  National Heart, Lung, and Blood Institute: www.nhlbi.nih.gov  American Heart Association: www.heart.org Summary  The DASH eating plan is a healthy eating plan that has been shown to reduce high blood pressure (hypertension). It may also reduce your risk for type 2 diabetes, heart disease, and stroke.  With the DASH eating plan, you should limit salt (sodium) intake to 2,300 mg a day. If you have hypertension, you may need to reduce your sodium intake to 1,500 mg a day.  When on the DASH eating plan, aim to eat more fresh fruits and vegetables, whole grains, lean proteins, low-fat dairy, and heart-healthy fats.  Work with your health care provider or diet and nutrition specialist (dietitian) to adjust your eating plan to your individual calorie needs. This information is not intended to replace advice given to you by your health care provider. Make sure you discuss any questions you have with your health care provider. Document Released: 04/09/2011 Document Revised: 04/13/2016 Document Reviewed: 04/13/2016 Elsevier Interactive Patient Education  2019 [employer]  

## 2018-07-12 NOTE — Progress Notes (Signed)
Cardiology Office Note:    Date:  07/12/2018   ID:  Caitlin Walker, DOB Feb 18, 1946, MRN [id number]  PCP:  Caitlin Cruel, MD  Cardiologist:  Caitlin Moores, MD  Referring MD: Caitlin Cruel, MD   Chief Complaint  Patient presents with  . Follow-up    Hypertension    History of Present Illness:    Caitlin Walker is a 73 y.o. female with a past medical history significant for hypertension.  She was last seen in the office by Dr. Acie Fredrickson on 04/11/2018 and noted to have mildly elevated blood pressure.  The patient has not tolerated amlodipine, Vasotec, Norvasc, Bystolic, valsartan.  She was on HCTZ and losartan but BP running high. Dr. Acie Fredrickson added hydralazine 25 mg 3 times daily.  Dr. Acie Fredrickson recommended to consider adding Aldactone at follow-up if needed.  The patient was also encouraged to exercise.  [name] is here today for 41-month follow-up. She appears younger than stated age.  Home BPs usually 120's-130's/ 80's. Her BP occ goes up especially after she eats out, with increased salt intake. She feels like she is tolerating the hydralazine well.   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 She does well with work. She walks about 3 miles, 3 times per week with no exertional symptoms. She does not sleep well due to insomnia. She is trying to get off of ambien due to possibility of dementia in women. She has no orthopnea or trouble breathing when she sleeps. No palpitations or edema.   Past Medical History:  Diagnosis Date  . Chronic leukopenia DX  2011   MILD--  HEMATOLOGIST  --  DR Caitlin Walker  . H/O hiatal hernia    TINY PER EGD 2007  . Hematuria   . Hemorrhoids    INTERNAL AND EXTERNAL   . History of colon polyps    BENIGN  . Hyperlipidemia   . Hypertension   . Ureterocele    LEFT  . Wears contact lenses     Past Surgical History:  Procedure Laterality Date  . APPENDECTOMY  1989  . BREAST BIOPSY Left    benign  .  COLONOSCOPY N/A 08/01/2012   Procedure: COLONOSCOPY;  Surgeon: Caitlin Columbia, MD;  Location: [facility];  Service: Endoscopy;  Laterality: N/A;  pt. would like dan to do procedure  . CYSTO/  URETHRAL DILATION /  RIGHT RETROGRADE PYELOGRAM/ RIGHT URETEROSCOPY/  INSTILLATION THERAPY  06-18-2008  . CYSTOSCOPY/RETROGRADE/URETEROSCOPY Left 10/26/2013   Procedure: CYSTOSCOPY/RETROGRADE/DIAGNOSTIC DIGITAL URETEROSCOPY WITH LEFT URETERAL STENT PLACEMENT.;  Surgeon: Caitlin Frock, MD;  Location: [facility];  Service: Urology;  Laterality: Left;  . DILATION AND CURETTAGE OF UTERUS  2001  . SUPRACERVICAL ABDOMINAL HYSTERECTOMY  02-25-2000   W/   BILATERAL SALPINGOOPHORECTOMY AND RECTOCELE REPAIR    Current Medications: Current Meds  Medication Sig  . aspirin 81 MG tablet Take 81 mg by mouth every evening.   . calcium carbonate (OS-CAL) 600 MG TABS Take 600 mg by mouth daily with breakfast.   . hydrALAZINE (APRESOLINE) 10 MG tablet Take 1 tablet (10 mg total) by mouth 3 (three) times daily.  . hydrochlorothiazide (MICROZIDE) 12.5 MG capsule Take 12.5 mg by mouth 2 (two) times daily.  Marland Kitchen losartan (COZAAR) 25 MG tablet Take 2 tablets by mouth every day.  . magnesium oxide (MAG-OX) 400 MG tablet Take 400 mg by mouth every evening.   . potassium chloride (K-DUR,KLOR-CON) 10 MEQ tablet  Take 1 tablet (10 mEq total) by mouth daily.  Marland Kitchen zolpidem (AMBIEN) 10 MG tablet Take 1 tablet by mouth at bedtime as needed for sleep.     Allergies:   Amlodipine; Bystolic [nebivolol hcl]; Diovan [valsartan]; Prednisone; Bactrim; Flomax [tamsulosin hcl]; and Sulfa antibiotics   Social History   Socioeconomic History  . Marital status: Divorced    Spouse name: Not on file  . Number of children: Not on file  . Years of education: Not on file  . Highest education level: Not on file  Occupational History  . Not on file  Social Needs  . Financial resource strain: Not on file  . Food insecurity:    Worry:  Not on file    Inability: Not on file  . Transportation needs:    Medical: Not on file    Non-medical: Not on file  Tobacco Use  . Smoking status: Never Smoker  . Smokeless tobacco: Never Used  Substance and Sexual Activity  . Alcohol use: No  . Drug use: No  . Sexual activity: Not on file  Lifestyle  . Physical activity:    Days per week: Not on file    Minutes per session: Not on file  . Stress: Not on file  Relationships  . Social connections:    Talks on phone: Not on file    Gets together: Not on file    Attends religious service: Not on file    Active member of club or organization: Not on file    Attends meetings of clubs or organizations: Not on file    Relationship status: Not on file  Other Topics Concern  . Not on file  Social History Narrative  . Not on file     Family History: The patient's family history includes Colon cancer in her sister; Emphysema in her father; Heart disease in her father; Hyperlipidemia in her brother, sister, and sister; Hypertension in her brother, brother, brother, father, sister, sister, sister, sister, and sister. ROS:   Please see the history of present illness.     All other systems reviewed and are negative.  EKGs/Labs/Other Studies Reviewed:     EKG:  EKG is not ordered today.    Recent Labs: 10/15/2017: ALT 15; B Natriuretic Peptide 21.0; BUN 10; Creatinine, Ser 0.53; Hemoglobin 12.7; Platelets 298; Potassium 4.1; Sodium 136   Recent Lipid Panel    Component Value Date/Time   CHOL 187 03/15/2015 1219   TRIG 83 03/15/2015 1219   HDL 89 03/15/2015 1219   CHOLHDL 2.1 03/15/2015 1219   VLDL 17 03/15/2015 1219   LDLCALC 81 03/15/2015 1219    Physical Exam:    VS:  BP 124/84   Pulse 70   Ht 5\' 4"  (1.626 m)   Wt 158 lb 1.9 oz (71.7 kg)   SpO2 99%   BMI 27.14 kg/m     Wt Readings from Last 3 Encounters:  07/12/18 158 lb 1.9 oz (71.7 kg)  05/13/18 163 lb (73.9 kg)  04/11/18 160 lb (72.6 kg)     Physical Exam    Constitutional: She is oriented to person, place, and time. She appears well-developed and well-nourished. No distress.  Appears much younger than her age.   HENT:  Head: Normocephalic and atraumatic.  Neck: Normal range of motion. Neck supple. No JVD present.  Cardiovascular: Normal rate, regular rhythm, normal heart sounds and intact distal pulses. Exam reveals no gallop and no friction rub.  No murmur heard. Pulmonary/Chest: Effort  normal and breath sounds normal. No respiratory distress. She has no wheezes. She has no rales.  Abdominal: Soft. Bowel sounds are normal.  Musculoskeletal: Normal range of motion.        General: No deformity or edema.  Neurological: She is alert and oriented to person, place, and time.  Skin: Skin is warm and dry.  Psychiatric: She has a normal mood and affect. Her behavior is normal. Judgment and thought content normal.  Vitals reviewed.  ASSESSMENT:    1. Essential hypertension    PLAN:    In order of problems listed above:  1.  Essential hypertension -Patient is intolerant to numerous medications.  She has been on losartan 100 mg and HCTZ 12.5 mg BID but was running high BP in January so Dr. Acie Fredrickson added hydralazine 25 mg 3 times daily. -BP has improved. Continue current therapy. -Labs by [facility] 03/28/18: SCr 0.63, K+ 4.3 -Low-sodium, DASH diet   Medication Adjustments/Labs and Tests Ordered: Current medicines are reviewed at length with the patient today.  Concerns regarding medicines are outlined above. Labs and tests ordered and medication changes are outlined in the patient instructions below:  Patient Instructions  Medication Instructions:  Your physician recommends that you continue on your current medications as directed. Please refer to the Current Medication list given to you today.  If you need a refill on your cardiac medications before your next appointment, please call your pharmacy.   Lab work: None  If you have labs (blood  work) drawn today and your tests are completely normal, you will receive your results only by: Marland Kitchen MyChart Message (if you have MyChart) OR . A paper copy in the mail If you have any lab test that is abnormal or we need to change your treatment, we will call you to review the results.  Testing/Procedures: None  Follow-Up: At [facility], you and your health needs are our priority.  As part of our continuing mission to provide you with exceptional heart care, we have created designated Provider Care Teams.  These Care Teams include your primary Cardiologist (physician) and Advanced Practice Providers (APPs -  Physician Assistants and Nurse Practitioners) who all work together to provide you with the care you need, when you need it. You will need a follow up appointment in:  6 months.  Please call our office 2 months in advance to schedule this appointment.  You may see Caitlin Moores, MD or one of the following Advanced Practice Providers on your designated Care Team: Richardson Dopp, PA-C [place], [place] . Daune Perch, NP  Any Other Special Instructions Will Be Listed Below (If Applicable).  DASH Eating Plan DASH stands for "Dietary Approaches to Stop Hypertension." The DASH eating plan is a healthy eating plan that has been shown to reduce high blood pressure (hypertension). It may also reduce your risk for type 2 diabetes, heart disease, and stroke. The DASH eating plan may also help with weight loss. What are tips for following this plan?  General guidelines  Avoid eating more than 2,300 mg (milligrams) of salt (sodium) a day. If you have hypertension, you may need to reduce your sodium intake to 1,500 mg a day.  Limit alcohol intake to no more than 1 drink a day for nonpregnant women and 2 drinks a day for men. One drink equals 12 oz of beer, 5 oz of wine, or 1 oz of hard liquor.  Work with your health care provider to maintain a healthy body weight or  to lose weight. Ask what an  ideal weight is for you.  Get at least 30 minutes of exercise that causes your heart to beat faster (aerobic exercise) most days of the week. Activities may include walking, swimming, or biking.  Work with your health care provider or diet and nutrition specialist (dietitian) to adjust your eating plan to your individual calorie needs. Reading food labels   Check food labels for the amount of sodium per serving. Choose foods with less than 5 percent of the Daily Value of sodium. Generally, foods with less than 300 mg of sodium per serving fit into this eating plan.  To find whole grains, look for the word "whole" as the first word in the ingredient list. Shopping  Buy products labeled as "low-sodium" or "no salt added."  Buy fresh foods. Avoid canned foods and premade or frozen meals. Cooking  Avoid adding salt when cooking. Use salt-free seasonings or herbs instead of table salt or sea salt. Check with your health care provider or pharmacist before using salt substitutes.  Do not fry foods. Cook foods using healthy methods such as baking, boiling, grilling, and broiling instead.  Cook with heart-healthy oils, such as olive, canola, soybean, or sunflower oil. Meal planning  Eat a balanced diet that includes: ? 5 or more servings of fruits and vegetables each day. At each meal, try to fill half of your plate with fruits and vegetables. ? Up to 6-8 servings of whole grains each day. ? Less than 6 oz of lean meat, poultry, or fish each day. A 3-oz serving of meat is about the same size as a deck of cards. One egg equals 1 oz. ? 2 servings of low-fat dairy each day. ? A serving of nuts, seeds, or beans 5 times each week. ? Heart-healthy fats. Healthy fats called Omega-3 fatty acids are found in foods such as flaxseeds and coldwater fish, like sardines, salmon, and mackerel.  Limit how much you eat of the following: ? Canned or prepackaged foods. ? Food that is high in trans fat, such  as fried foods. ? Food that is high in saturated fat, such as fatty meat. ? Sweets, desserts, sugary drinks, and other foods with added sugar. ? Full-fat dairy products.  Do not salt foods before eating.  Try to eat at least 2 vegetarian meals each week.  Eat more home-cooked food and less restaurant, buffet, and fast food.  When eating at a restaurant, ask that your food be prepared with less salt or no salt, if possible. What foods are recommended? The items listed may not be a complete list. Talk with your dietitian about what dietary choices are best for you. Grains Whole-grain or whole-wheat bread. Whole-grain or whole-wheat pasta. Brown rice. [place]. Bulgur. Whole-grain and low-sodium cereals. Pita bread. Low-fat, low-sodium crackers. Whole-wheat flour tortillas. Vegetables Fresh or frozen vegetables (raw, steamed, roasted, or grilled). Low-sodium or reduced-sodium tomato and vegetable juice. Low-sodium or reduced-sodium tomato sauce and tomato paste. Low-sodium or reduced-sodium canned vegetables. Fruits All fresh, dried, or frozen fruit. Canned fruit in natural juice (without added sugar). Meat and other protein foods Skinless chicken or [place]. Ground chicken or [place]. Pork with fat trimmed off. Fish and seafood. Egg whites. Dried beans, peas, or lentils. Unsalted nuts, nut butters, and seeds. Unsalted canned beans. Lean cuts of beef with fat trimmed off. Low-sodium, lean deli meat. Dairy Low-fat (1%) or fat-free (skim) milk. Fat-free, low-fat, or reduced-fat cheeses. Nonfat, low-sodium ricotta or cottage cheese. Low-fat  or nonfat yogurt. Low-fat, low-sodium cheese. Fats and oils Soft margarine without trans fats. Vegetable oil. Low-fat, reduced-fat, or light mayonnaise and salad dressings (reduced-sodium). Canola, safflower, olive, soybean, and sunflower oils. Avocado. Seasoning and other foods Herbs. Spices. Seasoning mixes without salt. Unsalted popcorn and pretzels.  Fat-free sweets. What foods are not recommended? The items listed may not be a complete list. Talk with your dietitian about what dietary choices are best for you. Grains Baked goods made with fat, such as croissants, muffins, or some breads. Dry pasta or rice meal packs. Vegetables Creamed or fried vegetables. Vegetables in a cheese sauce. Regular canned vegetables (not low-sodium or reduced-sodium). Regular canned tomato sauce and paste (not low-sodium or reduced-sodium). Regular tomato and vegetable juice (not low-sodium or reduced-sodium). Angie Fava. Olives. Fruits Canned fruit in a light or heavy syrup. Fried fruit. Fruit in cream or butter sauce. Meat and other protein foods Fatty cuts of meat. Ribs. Fried meat. Berniece Salines. Sausage. Bologna and other processed lunch meats. Salami. Fatback. Hotdogs. Bratwurst. Salted nuts and seeds. Canned beans with added salt. Canned or smoked fish. Whole eggs or egg yolks. Chicken or [place] with skin. Dairy Whole or 2% milk, cream, and half-and-half. Whole or full-fat cream cheese. Whole-fat or sweetened yogurt. Full-fat cheese. Nondairy creamers. Whipped toppings. Processed cheese and cheese spreads. Fats and oils Butter. Stick margarine. Lard. Shortening. Ghee. Bacon fat. Tropical oils, such as coconut, palm kernel, or palm oil. Seasoning and other foods Salted popcorn and pretzels. Onion salt, garlic salt, seasoned salt, table salt, and sea salt. Worcestershire sauce. Tartar sauce. Barbecue sauce. Teriyaki sauce. Soy sauce, including reduced-sodium. Steak sauce. Canned and packaged gravies. Fish sauce. Oyster sauce. Cocktail sauce. Horseradish that you find on the shelf. Ketchup. Mustard. Meat flavorings and tenderizers. Bouillon cubes. Hot sauce and Tabasco sauce. Premade or packaged marinades. Premade or packaged taco seasonings. Relishes. Regular salad dressings. Where to find more information:  National Heart, Lung, and [place]:  https://wilson-eaton.com/  American Heart Association: www.heart.org Summary  The DASH eating plan is a healthy eating plan that has been shown to reduce high blood pressure (hypertension). It may also reduce your risk for type 2 diabetes, heart disease, and stroke.  With the DASH eating plan, you should limit salt (sodium) intake to 2,300 mg a day. If you have hypertension, you may need to reduce your sodium intake to 1,500 mg a day.  When on the DASH eating plan, aim to eat more fresh fruits and vegetables, whole grains, lean proteins, low-fat dairy, and heart-healthy fats.  Work with your health care provider or diet and nutrition specialist (dietitian) to adjust your eating plan to your individual calorie needs. This information is not intended to replace advice given to you by your health care provider. Make sure you discuss any questions you have with your health care provider. Document Released: 04/09/2011 Document Revised: 04/13/2016 Document Reviewed: 04/13/2016 Elsevier Interactive Patient Education  2019 [place], Daune Perch, NP  07/12/2018 10:18 AM    [place]

## 2018-09-27 DIAGNOSIS — G4700 Insomnia, unspecified: Secondary | ICD-10-CM | POA: Diagnosis not present

## 2018-09-29 DIAGNOSIS — Q142 Congenital malformation of optic disc: Secondary | ICD-10-CM | POA: Diagnosis not present

## 2018-09-29 DIAGNOSIS — H52223 Regular astigmatism, bilateral: Secondary | ICD-10-CM | POA: Diagnosis not present

## 2018-09-29 DIAGNOSIS — H5213 Myopia, bilateral: Secondary | ICD-10-CM | POA: Diagnosis not present

## 2018-09-29 DIAGNOSIS — H524 Presbyopia: Secondary | ICD-10-CM | POA: Diagnosis not present

## 2018-10-27 ENCOUNTER — Ambulatory Visit (INDEPENDENT_AMBULATORY_CARE_PROVIDER_SITE_OTHER): Payer: PPO | Admitting: Physician Assistant

## 2018-10-27 ENCOUNTER — Telehealth: Payer: Self-pay | Admitting: Cardiovascular Disease

## 2018-10-27 ENCOUNTER — Other Ambulatory Visit: Payer: Self-pay

## 2018-10-27 ENCOUNTER — Encounter: Payer: Self-pay | Admitting: Physician Assistant

## 2018-10-27 VITALS — BP 140/84 | HR 68 | Ht 64.0 in | Wt 157.1 lb

## 2018-10-27 DIAGNOSIS — R0789 Other chest pain: Secondary | ICD-10-CM | POA: Diagnosis not present

## 2018-10-27 DIAGNOSIS — I10 Essential (primary) hypertension: Secondary | ICD-10-CM

## 2018-10-27 DIAGNOSIS — Z7189 Other specified counseling: Secondary | ICD-10-CM | POA: Diagnosis not present

## 2018-10-27 DIAGNOSIS — R079 Chest pain, unspecified: Secondary | ICD-10-CM

## 2018-10-27 NOTE — Progress Notes (Signed)
Cardiology Office Note    Date:  10/27/2018   ID:  EZMAE SPEERS, DOB [birth year], MRN [id number]  PCP:  Lawerance Cruel, MD  Cardiologist:  Dr. Acie Fredrickson  Chief Complaint: CP and elevated BP  History of Present Illness:   Caitlin Walker is a 73 y.o. female with hx of HTN and HLD added to my schedule for CP and elevated blood pressure.   She has been followed by Dr. Acie Fredrickson for high blood pressure. She has not tolerated Norvasc, Valsartan and Bystolic. She was doing well on cardiac stand point when last seen by APP 07/2018.  Added to my schedule for high blood pressure and CP.  Patient had intermittent chest pain x 2 weeks. Each episode last for 5 minutes. Described as discomfort.  No associated dyspnea, nausea, diaphoresis, vomiting or radiation. She initially thought it was due to acid reflux but taking Nexium everyday x 5 days without improvement. Chest pain occurs once or twice a day and mostly while resting. Last for 5 minutes or so. No palpitation at that time. She walks 3 miles/day and does not have any chest pain at time. BP usually runs in 140-150s/100s. No fever, chills, cough, congestion, sore throat or COVID exposure.    Past Medical History:  Diagnosis Date  . Chronic leukopenia DX  2011   MILD--  HEMATOLOGIST  --  DR Beryle Beams  . H/O hiatal hernia    TINY PER EGD 2007  . Hematuria   . Hemorrhoids    INTERNAL AND EXTERNAL   . History of colon polyps    BENIGN  . Hyperlipidemia   . Hypertension   . Ureterocele    LEFT  . Wears contact lenses     Past Surgical History:  Procedure Laterality Date  . APPENDECTOMY  1989  . BREAST BIOPSY Left    benign  . COLONOSCOPY N/A 08/01/2012   Procedure: COLONOSCOPY;  Surgeon: Jeryl Columbia, MD;  Location: [facility];  Service: Endoscopy;  Laterality: N/A;  pt. would like Caitlin Walker to do procedure  . CYSTO/  URETHRAL DILATION /  RIGHT RETROGRADE PYELOGRAM/ RIGHT URETEROSCOPY/  INSTILLATION THERAPY  06-18-2008  .  CYSTOSCOPY/RETROGRADE/URETEROSCOPY Left 10/26/2013   Procedure: CYSTOSCOPY/RETROGRADE/DIAGNOSTIC DIGITAL URETEROSCOPY WITH LEFT URETERAL STENT PLACEMENT.;  Surgeon: Alexis Frock, MD;  Location: [facility];  Service: Urology;  Laterality: Left;  . DILATION AND CURETTAGE OF UTERUS  2001  . SUPRACERVICAL ABDOMINAL HYSTERECTOMY  02-25-2000   W/   BILATERAL SALPINGOOPHORECTOMY AND RECTOCELE REPAIR    Current Medications: Prior to Admission medications   Medication Sig Start Date End Date Taking? Authorizing Provider  aspirin 81 MG tablet Take 81 mg by mouth every evening.     [provider]  calcium carbonate (OS-CAL) 600 MG TABS Take 600 mg by mouth daily with breakfast.     [provider]  hydrALAZINE (APRESOLINE) 10 MG tablet Take 1 tablet (10 mg total) by mouth 3 (three) times daily. 04/11/18   Nahser, Wonda Cheng, MD  hydrochlorothiazide (MICROZIDE) 12.5 MG capsule Take 12.5 mg by mouth 2 (two) times daily.    [provider]  losartan (COZAAR) 25 MG tablet Take 2 tablets by mouth every day. 05/13/18   Nahser, Wonda Cheng, MD  magnesium oxide (MAG-OX) 400 MG tablet Take 400 mg by mouth every evening.     [provider]  potassium chloride (K-DUR,KLOR-CON) 10 MEQ tablet Take 1 tablet (10 mEq total) by mouth daily. 04/05/17   Nahser, Arnette Norris  J, MD  zolpidem (AMBIEN) 10 MG tablet Take 1 tablet by mouth at bedtime as needed for sleep. 03/30/18   [provider]    Allergies:   Amlodipine, Bystolic [nebivolol hcl], Diovan [valsartan], Prednisone, Bactrim, Flomax [tamsulosin hcl], and Sulfa antibiotics   Social History   Socioeconomic History  . Marital status: Divorced    Spouse name: Not on file  . Number of children: Not on file  . Years of education: Not on file  . Highest education level: Not on file  Occupational History  . Not on file  Social Needs  . Financial resource strain: Not on file  . Food insecurity    Worry: Not on  file    Inability: Not on file  . Transportation needs    Medical: Not on file    Non-medical: Not on file  Tobacco Use  . Smoking status: Never Smoker  . Smokeless tobacco: Never Used  Substance and Sexual Activity  . Alcohol use: No  . Drug use: No  . Sexual activity: Not on file  Lifestyle  . Physical activity    Days per week: Not on file    Minutes per session: Not on file  . Stress: Not on file  Relationships  . Social Herbalist on phone: Not on file    Gets together: Not on file    Attends religious service: Not on file    Active member of club or organization: Not on file    Attends meetings of clubs or organizations: Not on file    Relationship status: Not on file  Other Topics Concern  . Not on file  Social History Narrative  . Not on file     Family History:  The patient's family history includes Colon cancer in her sister; Emphysema in her father; Heart disease in her father; Hyperlipidemia in her brother, sister, and sister; Hypertension in her brother, brother, brother, father, sister, sister, sister, sister, and sister.   ROS:   Please see the history of present illness.    ROS All other systems reviewed and are negative.   PHYSICAL EXAM:   VS:  BP 140/84   Pulse 68   Ht 5\' 4"  (1.626 m)   Wt 157 lb 1.9 oz (71.3 kg)   SpO2 98%   BMI 26.97 kg/m    GEN: Well nourished, well developed, in no acute distress  HEENT: normal  Neck: no JVD, carotid bruits, or masses Cardiac: RRR; no murmurs, rubs, or gallops,no edema  Respiratory:  clear to auscultation bilaterally, normal work of breathing GI: soft, nontender, nondistended, + BS MS: no deformity or atrophy  Skin: warm and dry, no rash Neuro:  Alert and Oriented x 3, Strength and sensation are intact Psych: euthymic mood, full affect  Wt Readings from Last 3 Encounters:  10/27/18 157 lb 1.9 oz (71.3 kg)  07/12/18 158 lb 1.9 oz (71.7 kg)  05/13/18 163 lb (73.9 kg)      Studies/Labs  Reviewed:   EKG:  EKG is ordered today.  The ekg ordered today demonstrates NSR at rate of 68 bpm  Recent Labs: No results found for requested labs within last 8760 hours.   Lipid Panel    Component Value Date/Time   CHOL 187 03/15/2015 1219   TRIG 83 03/15/2015 1219   HDL 89 03/15/2015 1219   CHOLHDL 2.1 03/15/2015 1219   VLDL 17 03/15/2015 1219   LDLCALC 81 03/15/2015 1219    Additional  studies/ records that were reviewed today include:   CT angio of chest PE 10/2017 FINDINGS: Cardiovascular: Satisfactory opacification of the pulmonary arteries to the segmental level. No evidence of pulmonary embolism. Normal heart size. No pericardial effusion. Normal caliber thoracic aorta. No aortic dissection. Minimal atherosclerotic calcification of the aortic arch.  Mediastinum/Nodes: No enlarged mediastinal, hilar, or axillary lymph nodes. Thyroid gland, trachea, and esophagus demonstrate no significant findings.  Lungs/Pleura: Mild bilateral lower lobe subsegmental atelectasis. No focal consolidation, pleural effusion, or pneumothorax. No suspicious pulmonary nodule.  Upper Abdomen: No acute abnormality.  Musculoskeletal: No chest wall abnormality. No acute or significant osseous findings.  Review of the MIP images confirms the above findings.  IMPRESSION: 1. No evidence of pulmonary embolism. No acute intrathoracic process.  ASSESSMENT & PLAN:    1. Hypertension  Intolerance to multiple medications. BP in 140-150/s100s at home. Here 140/84. Will increase Hydralazine to 20 mg TID (just got 90 days supply of 10mg , thus would not be on 25mg , does not want to get new rx). Continue HCTZ and Losartan at current dose.   2. Chest pain  - Seems atypical. She does not have any exertional episode. She walks 3 miles/day and some time incline. Continue Nexium. No indications for ischemic evaluation currently. EKG without acute changes.     Medication Adjustments/Labs and  Tests Ordered: Current medicines are reviewed at length with the patient today.  Concerns regarding medicines are outlined above.  Medication changes, Labs and Tests ordered today are listed in the Patient Instructions below. Patient Instructions  Medication Instructions:  INCREASE: Hydralazine to 20 mg 3 times a day   If you need a refill on your cardiac medications before your next appointment, please call your pharmacy.   Lab work: None If you have labs (blood work) drawn today and your tests are completely normal, you will receive your results only by: Marland Kitchen MyChart Message (if you have MyChart) OR . A paper copy in the mail If you have any lab test that is abnormal or we need to change your treatment, we will call you to review the results.  Testing/Procedures: None  Follow-Up: At [facility], you and your health needs are our priority.  As part of our continuing mission to provide you with exceptional heart care, we have created designated Provider Care Teams.  These Care Teams include your primary Cardiologist (physician) and Advanced Practice Providers (APPs -  Physician Assistants and Nurse Practitioners) who all work together to provide you with the care you need, when you need it. You will need a follow up appointment in:  2-3 weeks.  You may see Mertie Moores, MD or one of the following Advanced Practice Providers on your designated Care Team ON THE SAME DAY DR. Acie Fredrickson IS IN THE OFFICE Richardson Dopp, PA-C [place], [place] . Daune Perch, NP  Any Other Special Instructions Will Be Listed Below (If Applicable).       Jarrett Soho, [place]  10/27/2018 3:16 PM    [facility] [address], [address], [place]  [address] Phone: [phone]; Fax: [phone]

## 2018-10-27 NOTE — Telephone Encounter (Signed)
I spoke with the patient who is having chest discomfort and elevated BP.  I scheduled with Vin 6/25.      COVID-19 Pre-Screening Questions:  . In the past 7 to 10 days have you had a cough,  shortness of breath, headache, congestion, fever (100 or greater) body aches, chills, sore throat, or sudden loss of taste or sense of smell?  no . Have you been around anyone with known Covid 19 no . Have you been around anyone who is awaiting Covid 19 test results in the past 7 to 10 days?  no . Have you been around anyone who has been exposed to Covid 19, or has mentioned symptoms of Covid 19 within the past 7 to 10 days?  no  If you have any concerns/questions about symptoms patients report during screening (either on the phone or at threshold). Contact the provider seeing the patient or DOD for further guidance.  If neither are available contact a member of the leadership team.

## 2018-10-27 NOTE — Patient Instructions (Addendum)
Medication Instructions:  INCREASE: Hydralazine to 20 mg 3 times a day   If you need a refill on your cardiac medications before your next appointment, please call your pharmacy.   Lab work: None If you have labs (blood work) drawn today and your tests are completely normal, you will receive your results only by: Marland Kitchen MyChart Message (if you have MyChart) OR . A paper copy in the mail If you have any lab test that is abnormal or we need to change your treatment, we will call you to review the results.  Testing/Procedures: None  Follow-Up: At [facility], you and your health needs are our priority.  As part of our continuing mission to provide you with exceptional heart care, we have created designated Provider Care Teams.  These Care Teams include your primary Cardiologist (physician) and Advanced Practice Providers (APPs -  Physician Assistants and Nurse Practitioners) who all work together to provide you with the care you need, when you need it. You will need a follow up appointment in:  2-3 weeks.  You may see Mertie Moores, MD or one of the following Advanced Practice Providers on your designated Care Team ON THE SAME DAY DR. Acie Fredrickson IS IN THE OFFICE Richardson Dopp, PA-C [place], [place] . Daune Perch, NP  Any Other Special Instructions Will Be Listed Below (If Applicable).

## 2018-10-27 NOTE — Telephone Encounter (Signed)
New Message     Pt c/o BP issue: STAT if pt c/o blurred vision, one-sided weakness or slurred speech  1. What are your last 5 BP readings? 154/117, 150/110, 145/105, 140//105, 134/96  2. Are you having any other symptoms (ex. Dizziness, headache, blurred vision, passed out)? Chest discomfort felt like heartburn,   3. What is your BP issue? Patient's BP readings has been low and patient would like to speak to a nurse about the issue.

## 2018-11-03 DIAGNOSIS — H40023 Open angle with borderline findings, high risk, bilateral: Secondary | ICD-10-CM | POA: Diagnosis not present

## 2018-11-08 ENCOUNTER — Telehealth: Payer: Self-pay | Admitting: Nurse Practitioner

## 2018-11-08 NOTE — Telephone Encounter (Signed)
Left message for patient regarding call about appointment scheduled with Dr. Acie Fredrickson for 9/15. Advised that Dr. Acie Fredrickson will be doing virtual visits on this day and to call back to schedule a clinic visit on a different day.

## 2018-11-08 NOTE — Telephone Encounter (Signed)
Spoke with patient regarding appointment. She called back and spoke with an operator and rescheduled her virtual visit to 7/10 from 7/15. States she will run out of hydralazine 20 mg tablets on Wednesday 7/15 and would like his advice regarding current BP readings but does not feel that it is necessary to come into the office.  I explained that since visit will remain virtual, that we will need to keep the visit on 7/15 which is a day he will not be doing clinic visits. She verbalized understanding and agreement and thanked me for the call.

## 2018-11-10 DIAGNOSIS — H16141 Punctate keratitis, right eye: Secondary | ICD-10-CM | POA: Diagnosis not present

## 2018-11-11 ENCOUNTER — Telehealth: Payer: PPO | Admitting: Cardiovascular Disease

## 2018-11-14 ENCOUNTER — Other Ambulatory Visit: Payer: Self-pay | Admitting: Family Medicine

## 2018-11-14 DIAGNOSIS — Z1231 Encounter for screening mammogram for malignant neoplasm of breast: Secondary | ICD-10-CM

## 2018-11-16 ENCOUNTER — Ambulatory Visit: Payer: PPO | Admitting: Cardiovascular Disease

## 2018-11-16 ENCOUNTER — Encounter: Payer: Self-pay | Admitting: Cardiovascular Disease

## 2018-11-16 ENCOUNTER — Telehealth (INDEPENDENT_AMBULATORY_CARE_PROVIDER_SITE_OTHER): Payer: PPO | Admitting: Cardiovascular Disease

## 2018-11-16 ENCOUNTER — Other Ambulatory Visit: Payer: Self-pay

## 2018-11-16 VITALS — BP 128/81 | HR 81 | Temp 97.6°F | Ht 64.5 in | Wt 157.0 lb

## 2018-11-16 DIAGNOSIS — R079 Chest pain, unspecified: Secondary | ICD-10-CM

## 2018-11-16 DIAGNOSIS — R0789 Other chest pain: Secondary | ICD-10-CM

## 2018-11-16 DIAGNOSIS — I10 Essential (primary) hypertension: Secondary | ICD-10-CM | POA: Diagnosis not present

## 2018-11-16 DIAGNOSIS — Z7189 Other specified counseling: Secondary | ICD-10-CM

## 2018-11-16 MED ORDER — HYDRALAZINE HCL 25 MG PO TABS: 25.0000 mg | ORAL_TABLET | Freq: Three times a day (TID) | ORAL | 3 refills | Status: DC

## 2018-11-16 NOTE — Progress Notes (Signed)
Virtual Visit via Video Note   This visit type was conducted due to national recommendations for restrictions regarding the COVID-19 Pandemic (e.g. social distancing) in an effort to limit this patient's exposure and mitigate transmission in our community.  Due to her co-morbid illnesses, this patient is at least at moderate risk for complications without adequate follow up.  This format is felt to be most appropriate for this patient at this time.  All issues noted in this document were discussed and addressed.  A limited physical exam was performed with this format.  Please refer to the patient's chart for her consent to telehealth for [facility].   Date:  11/16/2018   ID:  Caitlin Walker, DOB Oct 01, 1945, MRN [id number]  Patient Location: Home Provider Location: Office  PCP:  Lawerance Cruel, MD  Cardiologist:  Mertie Moores, MD  Electrophysiologist:  None   Evaluation Performed:  Follow-Up Visit  Chief Complaint:  Chest pressure   November 16, 2018    Caitlin Walker is a 73 y.o. female with HTN and recent onset of chest pain .  Saw Vin,  Increased hydralazine BP and chest pain are much better. CP occurred at rest , while she was watching TV.  Still walking 3 miles a day.  3-4 times a week. No CP or dyspnea with walking .  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      The patient does not have symptoms concerning for COVID-19 infection (fever, chills, cough, or new shortness of breath).    Past Medical History:  Diagnosis Date  . Chronic leukopenia DX  2011   MILD--  HEMATOLOGIST  --  DR Beryle Beams  . H/O hiatal hernia    TINY PER EGD 2007  . Hematuria   . Hemorrhoids    INTERNAL AND EXTERNAL   . History of colon polyps    BENIGN  . Hyperlipidemia   . Hypertension   . Ureterocele    LEFT  . Wears contact lenses    Past Surgical History:  Procedure Laterality Date  . APPENDECTOMY  1989  . BREAST BIOPSY Left    benign  . COLONOSCOPY N/A 08/01/2012    Procedure: COLONOSCOPY;  Surgeon: Jeryl Columbia, MD;  Location: [facility];  Service: Endoscopy;  Laterality: N/A;  pt. would like dan to do procedure  . CYSTO/  URETHRAL DILATION /  RIGHT RETROGRADE PYELOGRAM/ RIGHT URETEROSCOPY/  INSTILLATION THERAPY  06-18-2008  . CYSTOSCOPY/RETROGRADE/URETEROSCOPY Left 10/26/2013   Procedure: CYSTOSCOPY/RETROGRADE/DIAGNOSTIC DIGITAL URETEROSCOPY WITH LEFT URETERAL STENT PLACEMENT.;  Surgeon: Alexis Frock, MD;  Location: [facility];  Service: Urology;  Laterality: Left;  . DILATION AND CURETTAGE OF UTERUS  2001  . SUPRACERVICAL ABDOMINAL HYSTERECTOMY  02-25-2000   W/   BILATERAL SALPINGOOPHORECTOMY AND RECTOCELE REPAIR     Current Meds  Medication Sig  . aspirin 81 MG tablet Take 81 mg by mouth every evening.   . calcium carbonate (OS-CAL) 600 MG TABS Take 600 mg by mouth daily with breakfast.   . hydrALAZINE (APRESOLINE) 10 MG tablet Take 20 mg by mouth 3 (three) times daily.  . hydrochlorothiazide (MICROZIDE) 12.5 MG capsule Take 12.5 mg by mouth 2 (two) times daily.  Marland Kitchen losartan (COZAAR) 25 MG tablet Take 2 tablets by mouth every day.  . magnesium oxide (MAG-OX) 400 MG tablet Take 400 mg by mouth every evening.   . potassium chloride (K-DUR,KLOR-CON) 10 MEQ tablet Take 1 tablet (10 mEq total) by mouth daily.  Marland Kitchen  zolpidem (AMBIEN) 10 MG tablet Take 1 tablet by mouth at bedtime as needed for sleep.     Allergies:   Amlodipine, Bystolic [nebivolol hcl], Diovan [valsartan], Prednisone, Bactrim, Flomax [tamsulosin hcl], and Sulfa antibiotics   Social History   Tobacco Use  . Smoking status: Never Smoker  . Smokeless tobacco: Never Used  Substance Use Topics  . Alcohol use: No  . Drug use: No     Family Hx: The patient's family history includes Colon cancer in her sister; Emphysema in her father; Heart disease in her father; Hyperlipidemia in her brother, sister, and sister; Hypertension in her brother, brother, brother, father,  sister, sister, sister, sister, and sister.  ROS:   Please see the history of present illness.     All other systems reviewed and are negative.   Prior CV studies:   The following studies were reviewed today:    Labs/Other Tests and Data Reviewed:    EKG:  No ECG reviewed.  Recent Labs: No results found for requested labs within last 8760 hours.   Recent Lipid Panel Lab Results  Component Value Date/Time   CHOL 187 03/15/2015 12:19 PM   TRIG 83 03/15/2015 12:19 PM   HDL 89 03/15/2015 12:19 PM   CHOLHDL 2.1 03/15/2015 12:19 PM   LDLCALC 81 03/15/2015 12:19 PM    Wt Readings from Last 3 Encounters:  11/16/18 157 lb (71.2 kg)  10/27/18 157 lb 1.9 oz (71.3 kg)  07/12/18 158 lb 1.9 oz (71.7 kg)     Objective:    Vital Signs:  BP 128/81 (BP Location: Right Arm, Patient Position: Sitting, Cuff Size: Normal)   Pulse 81   Temp 97.6 F (36.4 C)   Ht 5' 4.5" (1.638 m)   Wt 157 lb (71.2 kg)   BMI 26.53 kg/m    VITAL SIGNS:  reviewed GEN:  no acute distress EYES:  sclerae anicteric, EOMI - Extraocular Movements Intact RESPIRATORY:  normal respiratory effort, symmetric expansion CARDIOVASCULAR:  no peripheral edema SKIN:  no rash, lesions or ulcers. MUSCULOSKELETAL:  no obvious deformities. NEURO:  alert and oriented x 3, no obvious focal deficit PSYCH:  normal affect  ASSESSMENT & PLAN:    1. HTN:  BP is much better on the higher dose of hydralazine.  CP has resolved.   At this point, I dont think we need any further testing.   She walks 3 miles 4 times a week and does not have any chest pain   Continue current meds.  Will see her in 1 year for office visit   2.  Chest pain:  This has resolved.    COVID-19 Education: The signs and symptoms of COVID-19 were discussed with the patient and how to seek care for testing (follow up with PCP or arrange E-visit).  The importance of social distancing was discussed today.  Time:   Today, I have spent  19 minutes with  the patient with telehealth technology discussing the above problems.     Medication Adjustments/Labs and Tests Ordered: Current medicines are reviewed at length with the patient today.  Concerns regarding medicines are outlined above.   Tests Ordered: No orders of the defined types were placed in this encounter.   Medication Changes: No orders of the defined types were placed in this encounter.   Follow Up:  Virtual Visit or In Person in 1 year(s)  Signed, Mertie Moores, MD  11/16/2018 9:58 AM    [place]

## 2018-11-16 NOTE — Patient Instructions (Signed)
Medication Instructions:  Your physician has recommended you make the following change in your medication:  INCREASE Hydralazine (Apresoline) to 25 mg three times per day  If you need a refill on your cardiac medications before your next appointment, please call your pharmacy.    Lab work: None Ordered   Testing/Procedures: None Ordered   Follow-Up: At [facility], you and your health needs are our priority.  As part of our continuing mission to provide you with exceptional heart care, we have created designated Provider Care Teams.  These Care Teams include your primary Cardiologist (physician) and Advanced Practice Providers (APPs -  Physician Assistants and Nurse Practitioners) who all work together to provide you with the care you need, when you need it. You will need a follow up appointment in:  1 years.  Please call our office 2 months in advance to schedule this appointment.  You may see Mertie Moores, MD or one of the following Advanced Practice Providers on your designated Care Team: Richardson Dopp, PA-C [place], [place] . Daune Perch, NP

## 2018-12-19 DIAGNOSIS — M816 Localized osteoporosis [Lequesne]: Secondary | ICD-10-CM | POA: Diagnosis not present

## 2018-12-19 DIAGNOSIS — N958 Other specified menopausal and perimenopausal disorders: Secondary | ICD-10-CM | POA: Diagnosis not present

## 2018-12-19 DIAGNOSIS — Z01419 Encounter for gynecological examination (general) (routine) without abnormal findings: Secondary | ICD-10-CM | POA: Diagnosis not present

## 2018-12-19 DIAGNOSIS — Z6827 Body mass index (BMI) 27.0-27.9, adult: Secondary | ICD-10-CM | POA: Diagnosis not present

## 2018-12-29 ENCOUNTER — Ambulatory Visit
Admission: RE | Admit: 2018-12-29 | Discharge: 2018-12-29 | Disposition: A | Discharge status: Home / Self Care | Payer: PPO | Source: Ambulatory Visit | Attending: Family Medicine | Admitting: Family Medicine

## 2018-12-29 ENCOUNTER — Other Ambulatory Visit: Payer: Self-pay

## 2018-12-29 DIAGNOSIS — Z1231 Encounter for screening mammogram for malignant neoplasm of breast: Secondary | ICD-10-CM | POA: Diagnosis not present

## 2019-03-21 DIAGNOSIS — Z20828 Contact with and (suspected) exposure to other viral communicable diseases: Secondary | ICD-10-CM | POA: Diagnosis not present

## 2019-03-21 DIAGNOSIS — R05 Cough: Secondary | ICD-10-CM | POA: Diagnosis not present

## 2019-03-21 DIAGNOSIS — J329 Chronic sinusitis, unspecified: Secondary | ICD-10-CM | POA: Diagnosis not present

## 2019-03-27 ENCOUNTER — Other Ambulatory Visit: Payer: Self-pay | Admitting: Cardiovascular Disease

## 2019-03-29 DIAGNOSIS — Z862 Personal history of diseases of the blood and blood-forming organs and certain disorders involving the immune mechanism: Secondary | ICD-10-CM | POA: Diagnosis not present

## 2019-03-29 DIAGNOSIS — E7800 Pure hypercholesterolemia, unspecified: Secondary | ICD-10-CM | POA: Diagnosis not present

## 2019-03-29 DIAGNOSIS — I10 Essential (primary) hypertension: Secondary | ICD-10-CM | POA: Diagnosis not present

## 2019-03-31 DIAGNOSIS — Z20828 Contact with and (suspected) exposure to other viral communicable diseases: Secondary | ICD-10-CM | POA: Diagnosis not present

## 2019-03-31 DIAGNOSIS — S29011A Strain of muscle and tendon of front wall of thorax, initial encounter: Secondary | ICD-10-CM | POA: Diagnosis not present

## 2019-03-31 DIAGNOSIS — R05 Cough: Secondary | ICD-10-CM | POA: Diagnosis not present

## 2019-04-03 DIAGNOSIS — Z0000 Encounter for general adult medical examination without abnormal findings: Secondary | ICD-10-CM | POA: Diagnosis not present

## 2019-04-03 DIAGNOSIS — K219 Gastro-esophageal reflux disease without esophagitis: Secondary | ICD-10-CM | POA: Diagnosis not present

## 2019-04-03 DIAGNOSIS — R05 Cough: Secondary | ICD-10-CM | POA: Diagnosis not present

## 2019-04-03 DIAGNOSIS — G479 Sleep disorder, unspecified: Secondary | ICD-10-CM | POA: Diagnosis not present

## 2019-04-03 DIAGNOSIS — H43813 Vitreous degeneration, bilateral: Secondary | ICD-10-CM | POA: Diagnosis not present

## 2019-04-03 DIAGNOSIS — I10 Essential (primary) hypertension: Secondary | ICD-10-CM | POA: Diagnosis not present

## 2019-04-03 DIAGNOSIS — Z86010 Personal history of colonic polyps: Secondary | ICD-10-CM | POA: Diagnosis not present

## 2019-04-03 DIAGNOSIS — E7800 Pure hypercholesterolemia, unspecified: Secondary | ICD-10-CM | POA: Diagnosis not present

## 2019-05-15 DIAGNOSIS — K6289 Other specified diseases of anus and rectum: Secondary | ICD-10-CM | POA: Diagnosis not present

## 2019-05-15 DIAGNOSIS — K625 Hemorrhage of anus and rectum: Secondary | ICD-10-CM | POA: Diagnosis not present

## 2019-05-15 DIAGNOSIS — Z800 Family history of malignant neoplasm of digestive organs: Secondary | ICD-10-CM | POA: Diagnosis not present

## 2019-05-15 DIAGNOSIS — K649 Unspecified hemorrhoids: Secondary | ICD-10-CM | POA: Diagnosis not present

## 2019-05-15 DIAGNOSIS — Z86010 Personal history of colonic polyps: Secondary | ICD-10-CM | POA: Diagnosis not present

## 2019-05-19 DIAGNOSIS — Z1159 Encounter for screening for other viral diseases: Secondary | ICD-10-CM | POA: Diagnosis not present

## 2019-05-24 DIAGNOSIS — Z8371 Family history of colonic polyps: Secondary | ICD-10-CM | POA: Diagnosis not present

## 2019-05-24 DIAGNOSIS — Z800 Family history of malignant neoplasm of digestive organs: Secondary | ICD-10-CM | POA: Diagnosis not present

## 2019-05-24 DIAGNOSIS — Z86010 Personal history of colonic polyps: Secondary | ICD-10-CM | POA: Diagnosis not present

## 2019-05-24 DIAGNOSIS — K635 Polyp of colon: Secondary | ICD-10-CM | POA: Diagnosis not present

## 2019-05-24 DIAGNOSIS — K5730 Diverticulosis of large intestine without perforation or abscess without bleeding: Secondary | ICD-10-CM | POA: Diagnosis not present

## 2019-05-24 DIAGNOSIS — D123 Benign neoplasm of transverse colon: Secondary | ICD-10-CM | POA: Diagnosis not present

## 2019-05-26 ENCOUNTER — Ambulatory Visit (HOSPITAL_COMMUNITY)
Admission: EM | Admit: 2019-05-26 | Discharge: 2019-05-26 | Disposition: A | Discharge status: Home / Self Care | Payer: PPO | Attending: Family Medicine | Admitting: Family Medicine

## 2019-05-26 ENCOUNTER — Other Ambulatory Visit: Payer: Self-pay

## 2019-05-26 ENCOUNTER — Encounter (HOSPITAL_COMMUNITY): Payer: Self-pay

## 2019-05-26 DIAGNOSIS — K649 Unspecified hemorrhoids: Secondary | ICD-10-CM | POA: Diagnosis not present

## 2019-05-26 DIAGNOSIS — K6289 Other specified diseases of anus and rectum: Secondary | ICD-10-CM

## 2019-05-26 DIAGNOSIS — K635 Polyp of colon: Secondary | ICD-10-CM | POA: Diagnosis not present

## 2019-05-26 DIAGNOSIS — D123 Benign neoplasm of transverse colon: Secondary | ICD-10-CM | POA: Diagnosis not present

## 2019-05-26 MED ORDER — TRAMADOL HCL 50 MG PO TABS: 50.0000 mg | ORAL_TABLET | Freq: Three times a day (TID) | ORAL | 0 refills | Status: DC | PRN

## 2019-05-26 MED ORDER — HYDROCORT-PRAMOXINE (PERIANAL) 1-1 % EX FOAM: CUTANEOUS | 0 refills | Status: DC

## 2019-05-26 NOTE — Discharge Instructions (Signed)
Recommend try Proctofoam HC- use 1 applicatorful and apply rectally twice a day as needed. May also use topical OTC lidocaine gel once daily as needed for pain. May take Tramadol 50mg  every 8 hours as needed for pain. Continue to use stool softeners to avoid constipation. May use ice packs on area for comfort. Follow-up with your GI specialist and surgeon next week for further evaluation.

## 2019-05-26 NOTE — ED Triage Notes (Signed)
Patient presents to Urgent Care with complaints of bleeding hemorrhoids since yesterday. Patient reports they became tender two weeks ago but she noticed blood in her stool yesterday, pt did have GI look at them recently and said they look okay.

## 2019-05-27 NOTE — ED Provider Notes (Signed)
[place]    CSN: [id number] Arrival date & time: 05/26/19  1919      History   Chief Complaint Chief Complaint  Patient presents with  . Hemorrhoids    HPI Caitlin Walker is a 74 y.o. female.   74 year old female presents with rectal bleeding when she wipes and hemorrhoids that have become worse in the past few days. Started with a cough in Oct 2020 in which hemorrhoids developed soon after. Cough has been persistent but is under better control now with Zyrtec. Her hemorrhoids have been managed with conservative measures until 2 weeks ago when they became more tender and swollen. She has seen a GI specialist at [facility] and was prescribed topical hydrocortisone cream with minimal relief. She had a colonoscopy 2 days ago (05/24/2019) due to concern over polyps. No additional treatment for her hemorrhoids were performed at the time of the colonoscopy. Her hemorrhoids have become more painful over the past 2 days and she is concerned she may need surgical intervention. She has tried taking Tylenol and using Sitz baths with no relief. She denies any constipation or abdominal pain. Other chronic health issues include HTN, hyperlipidemia and insomnia and currently on Losartan, Hydralazine, HCTZ, K+, Mag Ox, Calcium, aspirin, and Ambien daily.   The history is provided by the patient.    Past Medical History:  Diagnosis Date  . Chronic leukopenia DX  2011   MILD--  HEMATOLOGIST  --  DR Beryle Beams  . H/O hiatal hernia    TINY PER EGD 2007  . Hematuria   . Hemorrhoids    INTERNAL AND EXTERNAL   . History of colon polyps    BENIGN  . Hyperlipidemia   . Hypertension   . Ureterocele    LEFT  . Wears contact lenses     Patient Active Problem List   Diagnosis Date Noted  . Cough variant asthma vs UCAS 11/04/2015  . Leukopenia 11/02/2012  . Chest pain 11/20/2010  . Hypertension 08/14/2010    Past Surgical History:  Procedure Laterality Date  . APPENDECTOMY  1989    . BREAST BIOPSY Left    benign  . COLONOSCOPY N/A 08/01/2012   Procedure: COLONOSCOPY;  Surgeon: Jeryl Columbia, MD;  Location: [facility];  Service: Endoscopy;  Laterality: N/A;  pt. would like dan to do procedure  . CYSTO/  URETHRAL DILATION /  RIGHT RETROGRADE PYELOGRAM/ RIGHT URETEROSCOPY/  INSTILLATION THERAPY  06-18-2008  . CYSTOSCOPY/RETROGRADE/URETEROSCOPY Left 10/26/2013   Procedure: CYSTOSCOPY/RETROGRADE/DIAGNOSTIC DIGITAL URETEROSCOPY WITH LEFT URETERAL STENT PLACEMENT.;  Surgeon: Alexis Frock, MD;  Location: [facility];  Service: Urology;  Laterality: Left;  . DILATION AND CURETTAGE OF UTERUS  2001  . SUPRACERVICAL ABDOMINAL HYSTERECTOMY  02-25-2000   W/   BILATERAL SALPINGOOPHORECTOMY AND RECTOCELE REPAIR    OB History   No obstetric history on file.      Home Medications    Prior to Admission medications   Medication Sig Start Date End Date Taking? Authorizing Provider  aspirin 81 MG tablet Take 81 mg by mouth every evening.    Yes [provider]  hydrALAZINE (APRESOLINE) 25 MG tablet Take 1 tablet (25 mg total) by mouth 3 (three) times daily. 11/16/18  Yes Nahser, Wonda Cheng, MD  hydrochlorothiazide (MICROZIDE) 12.5 MG capsule Take 12.5 mg by mouth 2 (two) times daily.   Yes [provider]  losartan (COZAAR) 25 MG tablet TAKE 2 TABLETS BY MOUTH EVERY DAY 03/28/19  Yes  Nahser, Wonda Cheng, MD  magnesium oxide (MAG-OX) 400 MG tablet Take 400 mg by mouth every evening.    Yes [provider]  potassium chloride (K-DUR,KLOR-CON) 10 MEQ tablet Take 1 tablet (10 mEq total) by mouth daily. 04/05/17  Yes Nahser, Wonda Cheng, MD  zolpidem (AMBIEN) 10 MG tablet Take 1 tablet by mouth at bedtime as needed for sleep. 03/30/18  Yes [provider]  calcium carbonate (OS-CAL) 600 MG TABS Take 600 mg by mouth daily with breakfast.     [provider]  hydrocortisone-pramoxine (PROCTOFOAM-HC) rectal foam Place 1 applicatorful  externally around rectum twice a day for hemorrhoids. 05/26/19   Katy Apo, NP  traMADol (ULTRAM) 50 MG tablet Take 1 tablet (50 mg total) by mouth every 8 (eight) hours as needed for moderate pain. 05/26/19   Katy Apo, NP    Family History Family History  Problem Relation Age of Onset  . Heart disease Father   . Hypertension Father   . Emphysema Father        smoked  . Hypertension Sister   . Hyperlipidemia Sister   . Hypertension Brother   . Hypertension Sister   . Colon cancer Sister   . Hypertension Sister   . Hypertension Sister   . Hypertension Sister   . Hyperlipidemia Sister   . Hypertension Brother   . Hypertension Brother   . Hyperlipidemia Brother     Social History Social History   Tobacco Use  . Smoking status: Never Smoker  . Smokeless tobacco: Never Used  Substance Use Topics  . Alcohol use: No  . Drug use: No     Allergies   Amlodipine, Bystolic [nebivolol hcl], Diovan [valsartan], Prednisone, Bactrim, Flomax [tamsulosin hcl], and Sulfa antibiotics   Review of Systems Review of Systems  Constitutional: Negative for activity change, appetite change, chills, fatigue and fever.  Gastrointestinal: Positive for anal bleeding, blood in stool and rectal pain (hemorroids). Negative for abdominal pain, constipation, diarrhea, nausea and vomiting.  Genitourinary: Negative for decreased urine volume, difficulty urinating, dysuria and hematuria.  Musculoskeletal: Negative for back pain and neck pain.  Skin: Negative for color change and rash.  Allergic/Immunologic: Positive for environmental allergies. Negative for immunocompromised state.  Neurological: Negative for dizziness, seizures, syncope, weakness, light-headedness and headaches.  Hematological: Negative for adenopathy. Does not bruise/bleed easily.  Psychiatric/Behavioral: Positive for sleep disturbance.     Physical Exam Triage Vital Signs ED Triage Vitals  Enc Vitals Group     BP  05/26/19 1937 (!) 140/95     Pulse Rate 05/26/19 1937 85     Resp 05/26/19 1937 18     Temp 05/26/19 1937 98.4 F (36.9 C)     Temp Source 05/26/19 1937 Oral     SpO2 05/26/19 1937 100 %     Weight --      Height --      Head Circumference --      Peak Flow --      Pain Score 05/26/19 1935 9     Pain Loc --      Pain Edu? --      Excl. in [place]? --    No data found.  Updated Vital Signs BP (!) 140/95 (BP Location: Right Arm)   Pulse 85   Temp 98.4 F (36.9 C) (Oral)   Resp 18   SpO2 100%   Visual Acuity Right Eye Distance:   Left Eye Distance:   Bilateral Distance:  Right Eye Near:   Left Eye Near:    Bilateral Near:     Physical Exam Vitals and nursing note reviewed. Exam conducted with a chaperone present (Dr. Meda Coffee also examined patient ).  Constitutional:      General: She is awake. She is not in acute distress.    Appearance: She is well-developed and well-groomed.     Comments: Patient lying down on her right side on the exam table in no acute distress but appears uncomfortable due to pain in buttock area.   HENT:     Head: Normocephalic and atraumatic.  Eyes:     Extraocular Movements: Extraocular movements intact.     Conjunctiva/sclera: Conjunctivae normal.  Cardiovascular:     Rate and Rhythm: Normal rate.  Pulmonary:     Effort: Pulmonary effort is normal.  Genitourinary:    Rectum: Tenderness and external hemorrhoid present.       Comments: Multiple small soft skin-colored external hemorrhoids present at upper rectal area. 1 pea-sized thrombosed pink hemorrhoid present in center. Very tender. Slight dried blood present around hemorrhoids but no active bleeding. No surrounding erythema. Did not perform internal exam due to significant pain with external exam.  Musculoskeletal:        General: Normal range of motion.     Cervical back: Normal range of motion.  Skin:    General: Skin is warm and dry.     Findings: No erythema or rash.    Neurological:     General: No focal deficit present.     Mental Status: She is alert and oriented to person, place, and time.  Psychiatric:        Mood and Affect: Mood normal.        Behavior: Behavior normal. Behavior is cooperative.        Thought Content: Thought content normal.        Judgment: Judgment normal.      UC Treatments / Results  Labs (all labs ordered are listed, but only abnormal results are displayed) Labs Reviewed - No data to display  EKG   Radiology No results found.  Procedures Procedures (including critical care time)  Medications Ordered in UC Medications - No data to display  Initial Impression / Assessment and Plan / UC Course  I have reviewed the triage vital signs and the nursing notes.  Pertinent labs & imaging results that were available during my care of the patient were reviewed by me and considered in my medical decision making (see chart for details).    Discussed case and findings with Dr. Meda Coffee. Discussed with patient that only 1 small hemorrhoid is thrombosed. No urgent need for surgical intervention. Would like to continue with conservative treatment for now but patient needs to contact her GI specialist and surgeon on Monday (2 days) for further evaluation. Will trial Proctofoam HC twice a day to add anesthetic to topical hydrocortisone to help with pain and inflammation. Also discussed using topical OTC Lidocaine gel sparingly on area for additional comfort. For severe pain, may take Tramadol 50mg  every 8 hours as needed. Continue to use stool softeners to avoid constipation and straining. May use ice packs around rectal area for comfort. Follow-up with your GI specialist and Surgeon next week as planned for further evaluation.  Final Clinical Impressions(s) / UC Diagnoses   Final diagnoses:  Hemorrhoids, unspecified hemorrhoid type  Rectal pain     Discharge Instructions     Recommend try Proctofoam HC- use 1 applicatorful  and  apply rectally twice a day as needed. May also use topical OTC lidocaine gel once daily as needed for pain. May take Tramadol 50mg  every 8 hours as needed for pain. Continue to use stool softeners to avoid constipation. May use ice packs on area for comfort. Follow-up with your GI specialist and surgeon next week for further evaluation.     ED Prescriptions    Medication Sig Dispense Auth. Provider   hydrocortisone-pramoxine [facility]) rectal foam Place 1 applicatorful externally around rectum twice a day for hemorrhoids. 10 g Katy Apo, NP   traMADol (ULTRAM) 50 MG tablet Take 1 tablet (50 mg total) by mouth every 8 (eight) hours as needed for moderate pain. 12 tablet , Nicholes Stairs, NP     I have reviewed the PDMP during this encounter. Only active Rx is Ambien in which she receives her prescription monthly. I believe the benefits outweigh the risks of prescribing a controlled medication at this time.    Katy Apo, NP 05/27/19 1054

## 2019-05-31 ENCOUNTER — Institutional Professional Consult (permissible substitution): Payer: PPO | Admitting: Pulmonary Disease

## 2019-05-31 DIAGNOSIS — K645 Perianal venous thrombosis: Secondary | ICD-10-CM | POA: Diagnosis not present

## 2019-06-07 ENCOUNTER — Ambulatory Visit: Payer: PPO | Attending: Internal Medicine

## 2019-06-07 DIAGNOSIS — Z20822 Contact with and (suspected) exposure to covid-19: Secondary | ICD-10-CM | POA: Diagnosis not present

## 2019-06-08 LAB — NOVEL CORONAVIRUS, NAA: SARS-CoV-2, NAA: NOT DETECTED

## 2019-06-09 ENCOUNTER — Telehealth: Payer: Self-pay | Admitting: Family Medicine

## 2019-06-09 NOTE — Telephone Encounter (Signed)
Pt aware covid lab test negative, not detected °

## 2019-06-18 ENCOUNTER — Ambulatory Visit: Payer: PPO | Attending: Internal Medicine

## 2019-06-18 DIAGNOSIS — Z23 Encounter for immunization: Secondary | ICD-10-CM | POA: Insufficient documentation

## 2019-06-18 NOTE — Progress Notes (Signed)
   Covid-19 Vaccination Clinic  Name:  Caitlin Walker    MRN: [id number] DOB: [birth year]  06/18/2019  [name] was observed post Covid-19 immunization for 15 minutes without incidence. She was provided with Vaccine Information Sheet and instruction to access the V-Safe system.   [name] was instructed to call 911 with any severe reactions post vaccine: Marland Kitchen Difficulty breathing  . Swelling of your face and throat  . A fast heartbeat  . A bad rash all over your body  . Dizziness and weakness    Immunizations Administered    Name Date Dose VIS Date Route   Pfizer COVID-19 Vaccine 06/18/2019 10:31 AM 0.3 mL 04/14/2019 Intramuscular   Manufacturer: [place]   Lot: [id number]   [place]: [id number]

## 2019-06-26 DIAGNOSIS — K645 Perianal venous thrombosis: Secondary | ICD-10-CM | POA: Diagnosis not present

## 2019-06-27 DIAGNOSIS — E7800 Pure hypercholesterolemia, unspecified: Secondary | ICD-10-CM | POA: Diagnosis not present

## 2019-06-27 DIAGNOSIS — I10 Essential (primary) hypertension: Secondary | ICD-10-CM | POA: Diagnosis not present

## 2019-06-27 DIAGNOSIS — J45909 Unspecified asthma, uncomplicated: Secondary | ICD-10-CM | POA: Diagnosis not present

## 2019-06-27 DIAGNOSIS — M818 Other osteoporosis without current pathological fracture: Secondary | ICD-10-CM | POA: Diagnosis not present

## 2019-06-28 ENCOUNTER — Ambulatory Visit (INDEPENDENT_AMBULATORY_CARE_PROVIDER_SITE_OTHER): Payer: PPO

## 2019-06-28 ENCOUNTER — Encounter: Payer: Self-pay | Admitting: Pulmonary Disease

## 2019-06-28 ENCOUNTER — Other Ambulatory Visit: Payer: Self-pay

## 2019-06-28 ENCOUNTER — Ambulatory Visit: Payer: PPO | Admitting: Pulmonary Disease

## 2019-06-28 VITALS — BP 118/70 | HR 78 | Temp 97.6°F | Ht 64.0 in | Wt 158.0 lb

## 2019-06-28 DIAGNOSIS — R05 Cough: Secondary | ICD-10-CM | POA: Diagnosis not present

## 2019-06-28 DIAGNOSIS — R059 Cough, unspecified: Secondary | ICD-10-CM

## 2019-06-28 LAB — CBC WITH DIFFERENTIAL/PLATELET
Basophils Absolute: 0 10*3/uL (ref 0.0–0.1)
Basophils Relative: 0.5 % (ref 0.0–3.0)
Eosinophils Absolute: 0.2 10*3/uL (ref 0.0–0.7)
Eosinophils Relative: 4 % (ref 0.0–5.0)
HCT: 35.2 % — ABNORMAL LOW (ref 36.0–46.0)
Hemoglobin: 11.7 g/dL — ABNORMAL LOW (ref 12.0–15.0)
Lymphocytes Relative: 35.4 % (ref 12.0–46.0)
Lymphs Abs: 1.5 10*3/uL (ref 0.7–4.0)
MCHC: 33.2 g/dL (ref 30.0–36.0)
MCV: 88.3 fl (ref 78.0–100.0)
Monocytes Absolute: 0.4 10*3/uL (ref 0.1–1.0)
Monocytes Relative: 9.3 % (ref 3.0–12.0)
Neutro Abs: 2.1 10*3/uL (ref 1.4–7.7)
Neutrophils Relative %: 50.8 % (ref 43.0–77.0)
Platelets: 354 10*3/uL (ref 150.0–400.0)
RBC: 3.98 Mil/uL (ref 3.87–5.11)
RDW: 15.2 % (ref 11.5–15.5)
WBC: 4.2 10*3/uL (ref 4.0–10.5)

## 2019-06-28 NOTE — Patient Instructions (Signed)
I suspect you have upper airway cough syndrome and acid reflux which is causing the cough  We will get some labs today including CBC differential, IgE, chest x-ray Schedule you for pulmonary function tests  The next time the cough occurs then our recommendations are as follows Use Flonase or Nasonex twice daily Use chlorpheniramine 8 mg 3 times daily.  Watch out for excessive sleepiness with this medication Use Prilosec twice daily for acid reflux All these medications are available over-the-counter  Follow-up in 3 to 6 months

## 2019-06-28 NOTE — Addendum Note (Signed)
Addended by: Suzzanne Cloud E on: 06/28/2019 10:00 AM   Modules accepted: Orders

## 2019-06-28 NOTE — Progress Notes (Signed)
Caitlin Walker    [id number]    [birth year]  Primary Care Physician:Ross, Dwyane Luo, MD  Referring Physician: Lawerance Cruel, MD [place],  [place] [address]  Chief complaint: Consult for chronic cough  HPI: 74 year old with history of hypertension, hyperlipidemia Developed chronic nonproductive cough in nov 2020.  Cough is nonproductive in nature, paroxysmal, no associated fevers or chills. The episodes of cough have precipitated a pulled chest muscle, hemorrhoids and conjunctival hemorrhage.  Treated with amoxicillin, Depo Medrol injection, Tessalon, Prilosec.  She had a chest x-ray at urgent care around November 2020 and was told it was normal  She started taking Zyrtec with Flonase few weeks ago and reports that the cough has improved. Previously seen in pulmonary clinic by Dr. Melvyn Novas in 2017 for similar complaints.  She reports recurrence of these episodes about 1 time every year.  She is okay in the interim  Pets: No pets Occupation: Retired Licensed conveyancer Exposures: No known exposures.  No mold, hot tub, Jacuzzi or no down pillows or comforters Smoking history: Never smoker Travel history: Recently lived in [place].  No significant recent travel Relevant family history: Father died of emphysema.  He was a smoker  Outpatient Encounter Medications as of 06/28/2019  Medication Sig  . albuterol (VENTOLIN HFA) 108 (90 Base) MCG/ACT inhaler SMARTSIG:2 Puff(s) By Mouth Every 4 Hours PRN  . aspirin 81 MG tablet Take 81 mg by mouth every evening.   . calcium carbonate (OS-CAL) 600 MG TABS Take 600 mg by mouth daily with breakfast.   . fluticasone (FLONASE) 50 MCG/ACT nasal spray Place 2 sprays into both nostrils daily.  . hydrALAZINE (APRESOLINE) 25 MG tablet Take 1 tablet (25 mg total) by mouth 3 (three) times daily.  . hydrochlorothiazide (MICROZIDE) 12.5 MG capsule Take 12.5 mg by mouth 2 (two) times daily.  Marland Kitchen losartan (COZAAR) 25 MG tablet TAKE 2  TABLETS BY MOUTH EVERY DAY  . magnesium oxide (MAG-OX) 400 MG tablet Take 400 mg by mouth every evening.   . potassium chloride (K-DUR,KLOR-CON) 10 MEQ tablet Take 1 tablet (10 mEq total) by mouth daily.  Marland Kitchen zolpidem (AMBIEN) 10 MG tablet Take 1 tablet by mouth at bedtime as needed for sleep.  . [DISCONTINUED] hydrocortisone-pramoxine (PROCTOFOAM-HC) rectal foam Place 1 applicatorful externally around rectum twice a day for hemorrhoids.  . [DISCONTINUED] traMADol (ULTRAM) 50 MG tablet Take 1 tablet (50 mg total) by mouth every 8 (eight) hours as needed for moderate pain.   No facility-administered encounter medications on file as of 06/28/2019.    Allergies as of 06/28/2019 - Review Complete 06/28/2019  Allergen Reaction Noted  . Amlodipine Shortness Of Breath 10/15/2017  . Bystolic [nebivolol hcl]  04/11/2018  . Diovan [valsartan]  11/30/2015  . Metoprolol  06/28/2019  . Olmesartan  06/28/2019  . Prednisone Swelling 03/12/2012  . Trazodone  06/28/2019  . Bactrim Rash 08/14/2010  . Flomax [tamsulosin hcl] Rash 08/14/2010  . Sulfa antibiotics Rash 03/12/2012    Past Medical History:  Diagnosis Date  . Chronic leukopenia DX  2011   MILD--  HEMATOLOGIST  --  DR Beryle Beams  . H/O hiatal hernia    TINY PER EGD 2007  . Hematuria   . Hemorrhoids    INTERNAL AND EXTERNAL   . History of colon polyps    BENIGN  . Hyperlipidemia   . Hypertension   . Ureterocele    LEFT  . Wears contact lenses  Past Surgical History:  Procedure Laterality Date  . APPENDECTOMY  1989  . BREAST BIOPSY Left    benign  . COLONOSCOPY N/A 08/01/2012   Procedure: COLONOSCOPY;  Surgeon: Jeryl Columbia, MD;  Location: [facility];  Service: Endoscopy;  Laterality: N/A;  pt. would like dan to do procedure  . CYSTO/  URETHRAL DILATION /  RIGHT RETROGRADE PYELOGRAM/ RIGHT URETEROSCOPY/  INSTILLATION THERAPY  06-18-2008  . CYSTOSCOPY/RETROGRADE/URETEROSCOPY Left 10/26/2013   Procedure:  CYSTOSCOPY/RETROGRADE/DIAGNOSTIC DIGITAL URETEROSCOPY WITH LEFT URETERAL STENT PLACEMENT.;  Surgeon: Alexis Frock, MD;  Location: [facility];  Service: Urology;  Laterality: Left;  . DILATION AND CURETTAGE OF UTERUS  2001  . SUPRACERVICAL ABDOMINAL HYSTERECTOMY  02-25-2000   W/   BILATERAL SALPINGOOPHORECTOMY AND RECTOCELE REPAIR    Family History  Problem Relation Age of Onset  . Heart disease Father   . Hypertension Father   . Emphysema Father        smoked  . Hypertension Sister   . Hyperlipidemia Sister   . Hypertension Brother   . Hypertension Sister   . Colon cancer Sister   . Hypertension Sister   . Hypertension Sister   . Hypertension Sister   . Hyperlipidemia Sister   . Hypertension Brother   . Hypertension Brother   . Hyperlipidemia Brother     Social History   Socioeconomic History  . Marital status: Divorced    Spouse name: Not on file  . Number of children: Not on file  . Years of education: Not on file  . Highest education level: Not on file  Occupational History  . Not on file  Tobacco Use  . Smoking status: Never Smoker  . Smokeless tobacco: Never Used  Substance and Sexual Activity  . Alcohol use: No  . Drug use: No  . Sexual activity: Not on file  Other Topics Concern  . Not on file  Social History Narrative  . Not on file   Social Determinants of Health   Financial Resource Strain:   . Difficulty of Paying Living Expenses: Not on file  Food Insecurity:   . Worried About Charity fundraiser in the Last Year: Not on file  . Ran Out of Food in the Last Year: Not on file  Transportation Needs:   . Lack of Transportation (Medical): Not on file  . Lack of Transportation (Non-Medical): Not on file  Physical Activity:   . Days of Exercise per Week: Not on file  . Minutes of Exercise per Session: Not on file  Stress:   . Feeling of Stress : Not on file  Social Connections:   . Frequency of Communication with Friends and  Family: Not on file  . Frequency of Social Gatherings with Friends and Family: Not on file  . Attends Religious Services: Not on file  . Active Member of Clubs or Organizations: Not on file  . Attends Archivist Meetings: Not on file  . Marital Status: Not on file  Intimate Partner Violence:   . Fear of Current or Ex-Partner: Not on file  . Emotionally Abused: Not on file  . Physically Abused: Not on file  . Sexually Abused: Not on file    Review of systems: Review of Systems  Constitutional: Negative for fever and chills.  HENT: Negative.   Eyes: Negative for blurred vision.  Respiratory: as per HPI  Cardiovascular: Negative for chest pain and palpitations.  Gastrointestinal: Negative for vomiting, diarrhea, blood per rectum. Genitourinary: Negative  for dysuria, urgency, frequency and hematuria.  Musculoskeletal: Negative for myalgias, back pain and joint pain.  Skin: Negative for itching and rash.  Neurological: Negative for dizziness, tremors, focal weakness, seizures and loss of consciousness.  Endo/Heme/Allergies: Negative for environmental allergies.  Psychiatric/Behavioral: Negative for depression, suicidal ideas and hallucinations.  All other systems reviewed and are negative.  Physical Exam: Blood pressure 118/70, pulse 78, temperature 97.6 F (36.4 C), temperature source Temporal, height 5\' 4"  (1.626 m), weight 158 lb (71.7 kg), SpO2 98 %. Gen:      No acute distress HEENT:  EOMI, sclera anicteric Neck:     No masses; no thyromegaly Lungs:    Clear to auscultation bilaterally; normal respiratory effort CV:         Regular rate and rhythm; no murmurs Abd:      + bowel sounds; soft, non-tender; no palpable masses, no distension Ext:    No edema; adequate peripheral perfusion Skin:      Warm and dry; no rash Neuro: alert and oriented x 3 Psych: normal mood and affect  Data Reviewed: Imaging: CTA 10/15/2017-no pulmonary embolism, mild lower lobe  subsegmental atelectasis.  I have reviewed the images personally.  Assessment:  Chronic cough Likely secondary to upper airway cough GERD Advised to continue steroid nasal spray, can use chlorpheniramine 8 mg 3 times daily to reduce postnasal drip If cough recurs then increase Prilosec to twice daily  Assess lungs with chest x-ray today, pulmonary function test Check CBC differential and IgE to evaluate asthma  Plan/Recommendations: - CBC, IgE, PFTs - Chest x-ray - Steroid nasal spray, chlorphentermine - Prilosec  Marshell Garfinkel MD [facility] 06/28/2019, 9:18 AM  CC: Lawerance Cruel, MD

## 2019-06-29 LAB — IGE: IgE (Immunoglobulin E), Serum: 241 kU/L — ABNORMAL HIGH

## 2019-07-18 ENCOUNTER — Ambulatory Visit: Payer: PPO | Attending: Internal Medicine

## 2019-07-18 DIAGNOSIS — Z23 Encounter for immunization: Secondary | ICD-10-CM

## 2019-07-18 NOTE — Progress Notes (Signed)
   Covid-19 Vaccination Clinic  Name:  LOUEEN ELM    MRN: [id number] DOB: 22-Dec-1945  07/18/2019  Ms. Maheu was observed post Covid-19 immunization for 15 minutes without incident. She was provided with Vaccine Information Sheet and instruction to access the V-Safe system.   Ms. Strommer was instructed to call 911 with any severe reactions post vaccine: Marland Kitchen Difficulty breathing  . Swelling of face and throat  . A fast heartbeat  . A bad rash all over body  . Dizziness and weakness   Immunizations Administered    Name Date Dose VIS Date Route   Pfizer COVID-19 Vaccine 07/18/2019 10:43 AM 0.3 mL 04/14/2019 Intramuscular   Manufacturer: [place]   Lot: [id number]   [place]: [id number]

## 2019-08-01 DIAGNOSIS — I10 Essential (primary) hypertension: Secondary | ICD-10-CM | POA: Diagnosis not present

## 2019-08-01 DIAGNOSIS — E7800 Pure hypercholesterolemia, unspecified: Secondary | ICD-10-CM | POA: Diagnosis not present

## 2019-08-01 DIAGNOSIS — M818 Other osteoporosis without current pathological fracture: Secondary | ICD-10-CM | POA: Diagnosis not present

## 2019-08-01 DIAGNOSIS — G4700 Insomnia, unspecified: Secondary | ICD-10-CM | POA: Diagnosis not present

## 2019-08-01 DIAGNOSIS — J45909 Unspecified asthma, uncomplicated: Secondary | ICD-10-CM | POA: Diagnosis not present

## 2019-08-09 DIAGNOSIS — K219 Gastro-esophageal reflux disease without esophagitis: Secondary | ICD-10-CM | POA: Diagnosis not present

## 2019-08-09 DIAGNOSIS — R05 Cough: Secondary | ICD-10-CM | POA: Diagnosis not present

## 2019-08-27 ENCOUNTER — Encounter (HOSPITAL_COMMUNITY): Payer: Self-pay | Admitting: Emergency Medicine

## 2019-08-27 ENCOUNTER — Emergency Department (HOSPITAL_COMMUNITY)
Admission: EM | Admit: 2019-08-27 | Discharge: 2019-08-27 | Disposition: A | Discharge status: Home / Self Care | Payer: PPO | Attending: Emergency Medicine | Admitting: Emergency Medicine

## 2019-08-27 ENCOUNTER — Emergency Department (HOSPITAL_COMMUNITY): Payer: PPO

## 2019-08-27 ENCOUNTER — Other Ambulatory Visit: Payer: Self-pay

## 2019-08-27 DIAGNOSIS — K5730 Diverticulosis of large intestine without perforation or abscess without bleeding: Secondary | ICD-10-CM | POA: Diagnosis not present

## 2019-08-27 DIAGNOSIS — I10 Essential (primary) hypertension: Secondary | ICD-10-CM | POA: Insufficient documentation

## 2019-08-27 DIAGNOSIS — K625 Hemorrhage of anus and rectum: Secondary | ICD-10-CM | POA: Diagnosis present

## 2019-08-27 DIAGNOSIS — K922 Gastrointestinal hemorrhage, unspecified: Secondary | ICD-10-CM | POA: Diagnosis not present

## 2019-08-27 DIAGNOSIS — Z7982 Long term (current) use of aspirin: Secondary | ICD-10-CM | POA: Diagnosis not present

## 2019-08-27 DIAGNOSIS — K5790 Diverticulosis of intestine, part unspecified, without perforation or abscess without bleeding: Secondary | ICD-10-CM | POA: Diagnosis not present

## 2019-08-27 DIAGNOSIS — Z79899 Other long term (current) drug therapy: Secondary | ICD-10-CM | POA: Diagnosis not present

## 2019-08-27 LAB — COMPREHENSIVE METABOLIC PANEL WITH GFR
ALT: 14 U/L (ref 0–44)
AST: 23 U/L (ref 15–41)
Albumin: 4.3 g/dL (ref 3.5–5.0)
Alkaline Phosphatase: 74 U/L (ref 38–126)
Anion gap: 9 (ref 5–15)
BUN: 13 mg/dL (ref 8–23)
CO2: 29 mmol/L (ref 22–32)
Calcium: 9.5 mg/dL (ref 8.9–10.3)
Chloride: 98 mmol/L (ref 98–111)
Creatinine, Ser: 0.72 mg/dL (ref 0.44–1.00)
GFR calc Af Amer: >60 mL/min
GFR calc non Af Amer: >60 mL/min
Glucose, Bld: 98 mg/dL (ref 70–99)
Potassium: 3.8 mmol/L (ref 3.5–5.1)
Sodium: 136 mmol/L (ref 135–145)
Total Bilirubin: 0.4 mg/dL (ref 0.3–1.2)
Total Protein: 7.3 g/dL (ref 6.5–8.1)

## 2019-08-27 LAB — POC OCCULT BLOOD, ED: Fecal Occult Bld: NEGATIVE

## 2019-08-27 LAB — CBC
HCT: 37.6 % (ref 36.0–46.0)
Hemoglobin: 12.3 g/dL (ref 12.0–15.0)
MCH: 29.6 pg (ref 26.0–34.0)
MCHC: 32.7 g/dL (ref 30.0–36.0)
MCV: 90.4 fL (ref 80.0–100.0)
Platelets: 327 10*3/uL (ref 150–400)
RBC: 4.16 MIL/uL (ref 3.87–5.11)
RDW: 14 % (ref 11.5–15.5)
WBC: 4.9 10*3/uL (ref 4.0–10.5)
nRBC: 0 % (ref 0.0–0.2)

## 2019-08-27 LAB — TYPE AND SCREEN
ABO/RH(D): O POS
Antibody Screen: NEGATIVE

## 2019-08-27 MED ORDER — HYDROCORTISONE ACETATE 25 MG RE SUPP: 25.0000 mg | Freq: Two times a day (BID) | RECTAL | 0 refills | Status: DC

## 2019-08-27 MED ORDER — SODIUM CHLORIDE 0.9 % IV BOLUS
1000.0000 mL | Freq: Once | INTRAVENOUS | Status: AC
  Administered 2019-08-27: 1000 mL via INTRAVENOUS

## 2019-08-27 MED ORDER — SODIUM CHLORIDE (PF) 0.9 % IJ SOLN
INTRAMUSCULAR | Status: AC
  Filled 2019-08-27: qty 50

## 2019-08-27 MED ORDER — IOHEXOL 350 MG/ML SOLN
100.0000 mL | Freq: Once | INTRAVENOUS | Status: AC | PRN
  Administered 2019-08-27: 100 mL via INTRAVENOUS

## 2019-08-27 NOTE — ED Provider Notes (Signed)
[facility] DEPT Provider Note   CSN: [id number] Arrival date & time: 08/27/19  1639     History Chief Complaint  Patient presents with  . Rectal Bleeding    Caitlin Walker is a 74 y.o. female hx of hypertension, hyperlipidemia, here presenting with bright red blood per rectum.  Patient is a retired Haematologist.  Patient states that she noticed 2 episodes of bright red blood per rectum after lunch today.  Denies any abdominal cramps or diarrhea.  Patient states that she has some rectal pain for the last several days.  She states that she has a history of internal and external hemorrhoids and hemorrhoid surgery by Dr. Marcello Moores.  Patient states that she is only on low-dose aspirin and did take it dose of Motrin today.  She is not on any blood thinners.  She states that she had colon polyps and follows up with Dr. Watt Climes from GI.   The history is provided by the patient.       Past Medical History:  Diagnosis Date  . Chronic leukopenia DX  2011   MILD--  HEMATOLOGIST  --  DR Beryle Beams  . H/O hiatal hernia    TINY PER EGD 2007  . Hematuria   . Hemorrhoids    INTERNAL AND EXTERNAL   . History of colon polyps    BENIGN  . Hyperlipidemia   . Hypertension   . Ureterocele    LEFT  . Wears contact lenses     Patient Active Problem List   Diagnosis Date Noted  . Cough variant asthma vs UCAS 11/04/2015  . Leukopenia 11/02/2012  . Chest pain 11/20/2010  . Hypertension 08/14/2010    Past Surgical History:  Procedure Laterality Date  . APPENDECTOMY  1989  . BREAST BIOPSY Left    benign  . COLONOSCOPY N/A 08/01/2012   Procedure: COLONOSCOPY;  Surgeon: Jeryl Columbia, MD;  Location: [facility];  Service: Endoscopy;  Laterality: N/A;  pt. would like dan to do procedure  . CYSTO/  URETHRAL DILATION /  RIGHT RETROGRADE PYELOGRAM/ RIGHT URETEROSCOPY/  INSTILLATION THERAPY  06-18-2008  . CYSTOSCOPY/RETROGRADE/URETEROSCOPY Left 10/26/2013   Procedure:  CYSTOSCOPY/RETROGRADE/DIAGNOSTIC DIGITAL URETEROSCOPY WITH LEFT URETERAL STENT PLACEMENT.;  Surgeon: Alexis Frock, MD;  Location: [facility];  Service: Urology;  Laterality: Left;  . DILATION AND CURETTAGE OF UTERUS  2001  . SUPRACERVICAL ABDOMINAL HYSTERECTOMY  02-25-2000   W/   BILATERAL SALPINGOOPHORECTOMY AND RECTOCELE REPAIR     OB History   No obstetric history on file.     Family History  Problem Relation Age of Onset  . Heart disease Father   . Hypertension Father   . Emphysema Father        smoked  . Hypertension Sister   . Hyperlipidemia Sister   . Hypertension Brother   . Hypertension Sister   . Colon cancer Sister   . Hypertension Sister   . Hypertension Sister   . Hypertension Sister   . Hyperlipidemia Sister   . Hypertension Brother   . Hypertension Brother   . Hyperlipidemia Brother     Social History   Tobacco Use  . Smoking status: Never Smoker  . Smokeless tobacco: Never Used  Substance Use Topics  . Alcohol use: No  . Drug use: No    Home Medications Prior to Admission medications   Medication Sig Start Date End Date Taking? Authorizing Provider  albuterol (VENTOLIN HFA) 108 (90 Base) MCG/ACT inhaler SMARTSIG:2 Puff(s)  By Mouth Every 4 Hours PRN 04/03/19   [provider]  aspirin 81 MG tablet Take 81 mg by mouth every evening.     [provider]  calcium carbonate (OS-CAL) 600 MG TABS Take 600 mg by mouth daily with breakfast.     [provider]  fluticasone (FLONASE) 50 MCG/ACT nasal spray Place 2 sprays into both nostrils daily.    [provider]  hydrALAZINE (APRESOLINE) 25 MG tablet Take 1 tablet (25 mg total) by mouth 3 (three) times daily. 11/16/18   Nahser, Wonda Cheng, MD  hydrochlorothiazide (MICROZIDE) 12.5 MG capsule Take 12.5 mg by mouth 2 (two) times daily.    [provider]  losartan (COZAAR) 25 MG tablet TAKE 2 TABLETS BY MOUTH EVERY DAY 03/28/19   Nahser, Wonda Cheng, MD    magnesium oxide (MAG-OX) 400 MG tablet Take 400 mg by mouth every evening.     [provider]  potassium chloride (K-DUR,KLOR-CON) 10 MEQ tablet Take 1 tablet (10 mEq total) by mouth daily. 04/05/17   Nahser, Wonda Cheng, MD  zolpidem (AMBIEN) 10 MG tablet Take 1 tablet by mouth at bedtime as needed for sleep. 03/30/18   [provider]    Allergies    Amlodipine, Bystolic [nebivolol hcl], Diovan [valsartan], Metoprolol, Olmesartan, Prednisone, Trazodone, Bactrim, Flomax [tamsulosin hcl], and Sulfa antibiotics  Review of Systems   Review of Systems  Gastrointestinal: Positive for blood in stool, hematochezia and rectal pain.  All other systems reviewed and are negative.   Physical Exam Updated Vital Signs BP (!) 162/91   Pulse 72   Temp 98.5 F (36.9 C) (Oral)   Resp 16   Ht 5\' 4"  (1.626 m)   Wt 71.7 kg   SpO2 99%   BMI 27.12 kg/m   Physical Exam Vitals and nursing note reviewed.  HENT:     Head: Normocephalic.     Nose: Nose normal.     Mouth/Throat:     Mouth: Mucous membranes are moist.  Eyes:     Extraocular Movements: Extraocular movements intact.     Pupils: Pupils are equal, round, and reactive to light.  Cardiovascular:     Rate and Rhythm: Normal rate and regular rhythm.     Pulses: Normal pulses.     Heart sounds: Normal heart sounds.  Pulmonary:     Effort: Pulmonary effort is normal.     Breath sounds: Normal breath sounds.  Abdominal:     General: Abdomen is flat.     Palpations: Abdomen is soft.  Genitourinary:    Comments: Rectal- external hemorrhoid that is not thrombosed, possible internal hemorrhoid but there is no obvious blood on the glove.  There is no obvious perirectal abscess. Musculoskeletal:        General: Normal range of motion.     Cervical back: Normal range of motion.  Skin:    General: Skin is warm.     Capillary Refill: Capillary refill takes less than 2 seconds.  Neurological:     General: No focal deficit  present.     Mental Status: She is alert and oriented to person, place, and time.  Psychiatric:        Mood and Affect: Mood normal.        Behavior: Behavior normal.     ED Results / Procedures / Treatments   Labs (all labs ordered are listed, but only abnormal results are displayed) Labs Reviewed  COMPREHENSIVE METABOLIC PANEL  CBC  POC  OCCULT BLOOD, ED  TYPE AND SCREEN  ABO/RH    EKG None  Radiology CT Angio Abd/Pel W and/or Wo Contrast  Result Date: 08/27/2019 CLINICAL DATA:  74 year old female with GI bleed. EXAM: CTA ABDOMEN AND PELVIS WITHOUT AND WITH CONTRAST TECHNIQUE: Multidetector CT imaging of the abdomen and pelvis was performed using the standard protocol during bolus administration of intravenous contrast. Multiplanar reconstructed images and MIPs were obtained and reviewed to evaluate the vascular anatomy. CONTRAST:  132mL OMNIPAQUE IOHEXOL 350 MG/ML SOLN COMPARISON:  CT abdomen pelvis dated 02/28/2015. FINDINGS: VASCULAR Aorta: Normal caliber aorta without aneurysm, dissection, vasculitis or significant stenosis. Celiac: Patent without evidence of aneurysm, dissection, vasculitis or significant stenosis. SMA: Patent without evidence of aneurysm, dissection, vasculitis or significant stenosis. Renals: Both renal arteries are patent without evidence of aneurysm, dissection, vasculitis, fibromuscular dysplasia or significant stenosis. IMA: Patent without evidence of aneurysm, dissection, vasculitis or significant stenosis. Inflow: Patent without evidence of aneurysm, dissection, vasculitis or significant stenosis. Proximal Outflow: Bilateral common femoral and visualized portions of the superficial and profunda femoral arteries are patent without evidence of aneurysm, dissection, vasculitis or significant stenosis. Veins: No obvious venous abnormality within the limitations of this arterial phase study. Review of the MIP images confirms the above findings. NON-VASCULAR Lower  chest: The visualized lung bases are clear. No intra-abdominal free air or free fluid. Hepatobiliary: No focal liver abnormality is seen. No gallstones, gallbladder wall thickening, or biliary dilatation. Pancreas: Unremarkable. No pancreatic ductal dilatation or surrounding inflammatory changes. Spleen: Normal in size without focal abnormality. Adrenals/Urinary Tract: Adrenal glands are unremarkable. Kidneys are normal, without renal calculi, focal lesion, or hydronephrosis. Bladder is unremarkable. Stomach/Bowel: There is colonic diverticulosis without active inflammatory changes. There is no bowel obstruction or active inflammation. The appendix is not visualized with certainty. No inflammatory changes identified in the right lower quadrant. Lymphatic: No adenopathy. Reproductive: Probable partial hysterectomy. No adnexal masses. Other: None Musculoskeletal: No acute or significant osseous findings. IMPRESSION: 1. No acute intra-abdominal or pelvic pathology. No evidence of active GI bleed. 2. Colonic diverticulosis.  No bowel obstruction. Electronically Signed   By: Anner Crete M.D.   On: 08/27/2019 21:32    Procedures Procedures (including critical care time)  Medications Ordered in ED Medications  sodium chloride (PF) 0.9 % injection (has no administration in time range)  sodium chloride 0.9 % bolus 1,000 mL (0 mLs Intravenous Stopped 08/27/19 2034)  iohexol (OMNIPAQUE) 350 MG/ML injection 100 mL (100 mLs Intravenous Contrast Given 08/27/19 2103)    ED Course  I have reviewed the triage vital signs and the nursing notes.  Pertinent labs & imaging results that were available during my care of the patient were reviewed by me and considered in my medical decision making (see chart for details).    MDM Rules/Calculators/A&P                      ANJENETTE JAGOE is a 74 y.o. female here presenting with a red blood in her stool.  Patient has history of hemorrhoids but does not appear to be  thrombosed on my exam.  Patient also has a history of colon polyps which likely may be bleeding.  Also consider diverticulosis as well.  Will get CBC, CMP, CT angio abdomen pelvis.  9:47 PM Patient's hemoglobin is stable at 12.3.  Her CTA showed diverticulosis.  Patient had no further bleeding in the ER.  She is not on any blood thinners.  Since she has no  further bleeding, I recommend that she can go home and try some Anusol.  Likely she has either small diverticular bleeding that is self-limiting or bleeding from her hemorrhoids.  Told her to call her GI doctor and her rectal surgeon.  Final Clinical Impression(s) / ED Diagnoses Final diagnoses:  None    Rx / DC Orders ED Discharge Orders    None       Drenda Freeze, MD 08/27/19 2148

## 2019-08-27 NOTE — Discharge Instructions (Addendum)
You likely have diverticular bleeding or bleeding from your hemorrhoids.  Use Anusol suppository.  Consider following up with your GI doctor or GI surgeon next week.  Return to ER if you have worsening bleeding, severe abdominal pain, vomiting, fever

## 2019-08-27 NOTE — ED Triage Notes (Signed)
Pt reports that when eating lunch around 1p today stomach felt uneasy like needed to use restroom. Had loose stool with some bright red blood in it. Reports "smelt of pld blood, I used to work in [place]". Reports had a second bout of rectal bleeding. Pt c/o rectal pains that is burning and "feels like my cheeks are being pulled apart". Hx internal and external hemorrhoids. Takes daily low dose ASA.

## 2019-08-28 LAB — ABO/RH: ABO/RH(D): O POS

## 2019-09-04 ENCOUNTER — Other Ambulatory Visit: Payer: Self-pay

## 2019-09-04 ENCOUNTER — Other Ambulatory Visit
Admission: RE | Admit: 2019-09-04 | Discharge: 2019-09-04 | Disposition: A | Discharge status: Home / Self Care | Payer: PPO | Source: Ambulatory Visit | Attending: Family Medicine | Admitting: Family Medicine

## 2019-09-04 DIAGNOSIS — Z01812 Encounter for preprocedural laboratory examination: Secondary | ICD-10-CM | POA: Diagnosis present

## 2019-09-04 DIAGNOSIS — Z20822 Contact with and (suspected) exposure to covid-19: Secondary | ICD-10-CM | POA: Diagnosis not present

## 2019-09-04 LAB — SARS CORONAVIRUS 2 (TAT 6-24 HRS): SARS Coronavirus 2: NEGATIVE

## 2019-09-07 ENCOUNTER — Ambulatory Visit (INDEPENDENT_AMBULATORY_CARE_PROVIDER_SITE_OTHER): Payer: PPO | Admitting: Pulmonary Disease

## 2019-09-07 ENCOUNTER — Other Ambulatory Visit: Payer: Self-pay

## 2019-09-07 DIAGNOSIS — R05 Cough: Secondary | ICD-10-CM

## 2019-09-07 DIAGNOSIS — R059 Cough, unspecified: Secondary | ICD-10-CM

## 2019-09-07 LAB — PULMONARY FUNCTION TEST
DL/VA % pred: 85 %
DL/VA: 3.5 ml/min/mmHg/L
DLCO cor % pred: 76 %
DLCO cor: 15.29 ml/min/mmHg
DLCO unc % pred: 73 %
DLCO unc: 14.75 ml/min/mmHg
FEF 25-75 Post: 2.31 L/s
FEF 25-75 Pre: 2.02 L/s
FEF2575-%Change-Post: 14 %
FEF2575-%Pred-Post: 133 %
FEF2575-%Pred-Pre: 116 %
FEV1-%Change-Post: 3 %
FEV1-%Pred-Post: 108 %
FEV1-%Pred-Pre: 104 %
FEV1-Post: 2.06 L
FEV1-Pre: 1.98 L
FEV1FVC-%Change-Post: 5 %
FEV1FVC-%Pred-Pre: 103 %
FEV6-%Change-Post: 0 %
FEV6-%Pred-Post: 103 %
FEV6-%Pred-Pre: 104 %
FEV6-Post: 2.44 L
FEV6-Pre: 2.46 L
FEV6FVC-%Change-Post: 0 %
FEV6FVC-%Pred-Post: 104 %
FEV6FVC-%Pred-Pre: 103 %
FVC-%Change-Post: -1 %
FVC-%Pred-Post: 99 %
FVC-%Pred-Pre: 101 %
FVC-Post: 2.44 L
FVC-Pre: 2.48 L
Post FEV1/FVC ratio: 84 %
Post FEV6/FVC ratio: 100 %
Pre FEV1/FVC ratio: 80 %
Pre FEV6/FVC Ratio: 99 %
RV % pred: 67 %
RV: 1.53 L
TLC % pred: 82 %
TLC: 4.29 L

## 2019-09-07 NOTE — Progress Notes (Signed)
PFT done today. 

## 2019-09-18 DIAGNOSIS — R102 Pelvic and perineal pain: Secondary | ICD-10-CM | POA: Diagnosis not present

## 2019-09-18 DIAGNOSIS — K625 Hemorrhage of anus and rectum: Secondary | ICD-10-CM | POA: Diagnosis not present

## 2019-09-21 ENCOUNTER — Other Ambulatory Visit: Payer: Self-pay | Admitting: General Surgery

## 2019-09-21 DIAGNOSIS — R102 Pelvic and perineal pain unspecified side: Secondary | ICD-10-CM

## 2019-09-27 ENCOUNTER — Other Ambulatory Visit: Payer: Self-pay | Admitting: Family Medicine

## 2019-09-27 DIAGNOSIS — Z1231 Encounter for screening mammogram for malignant neoplasm of breast: Secondary | ICD-10-CM

## 2019-09-28 ENCOUNTER — Other Ambulatory Visit: Payer: Self-pay

## 2019-09-28 ENCOUNTER — Ambulatory Visit
Admission: RE | Admit: 2019-09-28 | Discharge: 2019-09-28 | Disposition: A | Discharge status: Home / Self Care | Payer: PPO | Source: Ambulatory Visit | Attending: General Surgery | Admitting: General Surgery

## 2019-09-28 DIAGNOSIS — R102 Pelvic and perineal pain unspecified side: Secondary | ICD-10-CM

## 2019-09-28 MED ORDER — GADOBENATE DIMEGLUMINE 529 MG/ML IV SOLN
15.0000 mL | Freq: Once | INTRAVENOUS | Status: AC | PRN
  Administered 2019-09-28: 15 mL via INTRAVENOUS

## 2019-10-16 ENCOUNTER — Other Ambulatory Visit: Payer: Self-pay | Admitting: *Deleted

## 2019-10-16 MED ORDER — HYDRALAZINE HCL 25 MG PO TABS: 25.0000 mg | ORAL_TABLET | Freq: Three times a day (TID) | ORAL | 0 refills | Status: DC

## 2019-10-21 ENCOUNTER — Other Ambulatory Visit: Payer: PPO

## 2019-11-01 DIAGNOSIS — K625 Hemorrhage of anus and rectum: Secondary | ICD-10-CM | POA: Diagnosis not present

## 2019-11-14 ENCOUNTER — Ambulatory Visit: Payer: PPO | Admitting: Pulmonary Disease

## 2019-11-14 ENCOUNTER — Encounter: Payer: Self-pay | Admitting: Pulmonary Disease

## 2019-11-14 ENCOUNTER — Other Ambulatory Visit: Payer: Self-pay

## 2019-11-14 VITALS — BP 126/68 | HR 80 | Temp 98.7°F | Ht 64.5 in | Wt 160.2 lb

## 2019-11-14 DIAGNOSIS — R05 Cough: Secondary | ICD-10-CM

## 2019-11-14 DIAGNOSIS — R059 Cough, unspecified: Secondary | ICD-10-CM

## 2019-11-14 NOTE — Patient Instructions (Signed)
I am glad you are doing well with regard to your breathing Continue the regimen as we discussed for treatment of cough  Follow-up in pulmonary clinic as needed

## 2019-11-14 NOTE — Progress Notes (Signed)
Caitlin Walker    [id number]    December 11, 1945  Primary Care Physician:Ross, Dwyane Luo, MD  Referring Physician: Lawerance Cruel, MD [place],  [place] [address]  Chief complaint: Follow up for chronic cough  HPI: 74 year old with history of hypertension, hyperlipidemia Developed chronic nonproductive cough in nov 2020.  Cough is nonproductive in nature, paroxysmal, no associated fevers or chills. The episodes of cough have precipitated a pulled chest muscle, hemorrhoids and conjunctival hemorrhage.  Treated with amoxicillin, Depo Medrol injection, Tessalon, Prilosec.  She had a chest x-ray at urgent care around November 2020 and was told it was normal  She started taking Zyrtec with Flonase few weeks ago and reports that the cough has improved. Previously seen in pulmonary clinic by Dr. Melvyn Novas in 2017 for similar complaints.  She reports recurrence of these episodes about 1 time every year.  She is okay in the interim  Pets: No pets Occupation: Retired Licensed conveyancer Exposures: No known exposures.  No mold, hot tub, Jacuzzi or no down pillows or comforters Smoking history: Never smoker Travel history: Recently lived in [place].  No significant recent travel Relevant family history: Father died of emphysema.  He was a smoker  Interim history: Started on steroid nasal spray and hand histamine for upper airway cough and PPI for GERD Symptoms have significantly improved with this regimen with resolution of cough.  No complaints today  Outpatient Encounter Medications as of 11/14/2019  Medication Sig  . albuterol (VENTOLIN HFA) 108 (90 Base) MCG/ACT inhaler SMARTSIG:2 Puff(s) By Mouth Every 4 Hours PRN  . aspirin 81 MG tablet Take 81 mg by mouth every evening.   . calcium carbonate (OS-CAL) 600 MG TABS Take 600 mg by mouth daily with breakfast.   . Chlorpheniramine Maleate (CHLOR-TABLETS PO) Take 4 mg by mouth 3 (three) times daily as needed.  .  fluticasone (FLONASE) 50 MCG/ACT nasal spray Place 2 sprays into both nostrils daily.  . hydrALAZINE (APRESOLINE) 25 MG tablet Take 1 tablet (25 mg total) by mouth 3 (three) times daily.  . hydrochlorothiazide (MICROZIDE) 12.5 MG capsule Take 12.5 mg by mouth 2 (two) times daily.  Marland Kitchen losartan (COZAAR) 25 MG tablet TAKE 2 TABLETS BY MOUTH EVERY DAY  . magnesium oxide (MAG-OX) 400 MG tablet Take 400 mg by mouth every evening.   . potassium chloride (K-DUR,KLOR-CON) 10 MEQ tablet Take 1 tablet (10 mEq total) by mouth daily.  Marland Kitchen zolpidem (AMBIEN) 10 MG tablet Take 1 tablet by mouth at bedtime as needed for sleep.  . hydrocortisone (ANUSOL-HC) 25 MG suppository Place 1 suppository (25 mg total) rectally 2 (two) times daily. For 7 days (Patient not taking: Reported on 11/14/2019)   No facility-administered encounter medications on file as of 11/14/2019.   Physical Exam: Blood pressure 126/68, pulse 80, temperature 98.7 F (37.1 C), temperature source Oral, height 5' 4.5" (1.638 m), weight 160 lb 3.2 oz (72.7 kg), SpO2 98 %. Gen:      No acute distress HEENT:  EOMI, sclera anicteric Neck:     No masses; no thyromegaly Lungs:    Clear to auscultation bilaterally; normal respiratory effort CV:         Regular rate and rhythm; no murmurs Abd:      + bowel sounds; soft, non-tender; no palpable masses, no distension Ext:    No edema; adequate peripheral perfusion Skin:      Warm and dry; no rash Neuro: alert  and oriented x 3 Psych: normal mood and affect  Data Reviewed: Imaging: CTA 10/15/2017-no pulmonary embolism, mild lower lobe subsegmental atelectasis. CXR 06/28/2019-no acute process. I have reviewed the images personally.  PFTs: FVC 2.44 [9 9%], FEV1 2.06 [99%], F/F 84, TLC 4.29 [82%], DLCO 14.75 [73%] Minimal diffusion defect  Labs: CBC 06/28/2019-WBC 4.2, eos 4%, absolute eosinophil count 168 IgE 06/28/2019-241  Assessment:  Chronic cough Likely secondary to upper airway cough  GERD Symptoms have significantly improved with treatment  Advised to continue steroid nasal spray, can use chlorpheniramine 8 mg 3 times daily to reduce postnasal drip If cough recurs then increase Prilosec to twice daily  Chest x-ray and PFTs are unremarkable. She can follow-up in pulmonary clinic as needed  Plan/Recommendations: - Steroid nasal spray, chlorphentermine - Prilosec  Follow-up as needed  Marshell Garfinkel MD [facility] 11/14/2019, 11:27 AM  CC: Lawerance Cruel, MD

## 2019-11-29 DIAGNOSIS — H40023 Open angle with borderline findings, high risk, bilateral: Secondary | ICD-10-CM | POA: Diagnosis not present

## 2019-12-04 ENCOUNTER — Encounter: Payer: Self-pay | Admitting: Cardiovascular Disease

## 2019-12-04 ENCOUNTER — Other Ambulatory Visit: Payer: Self-pay

## 2019-12-04 ENCOUNTER — Ambulatory Visit: Payer: PPO | Admitting: Cardiovascular Disease

## 2019-12-04 VITALS — BP 138/94 | HR 66 | Ht 64.0 in | Wt 158.4 lb

## 2019-12-04 DIAGNOSIS — I10 Essential (primary) hypertension: Secondary | ICD-10-CM | POA: Diagnosis not present

## 2019-12-04 MED ORDER — SPIRONOLACTONE 25 MG PO TABS: 25.0000 mg | ORAL_TABLET | Freq: Every day | ORAL | 3 refills | Status: DC

## 2019-12-04 NOTE — Progress Notes (Signed)
Cardiology Office Note   Date:  12/04/2019   ID:  Caitlin Walker, DOB Jun 29, 1945, MRN [id number]  PCP:  Lawerance Cruel, MD  Cardiologist:   Mertie Moores, MD   Chief Complaint  Patient presents with  . Hypertension  . Hyperlipidemia   1. Hypertension 2. Insomnia   Caitlin Walker is a 74 year old female with a history of hypertension. She has done very well. She's not having episodes of chest pain or shortness of breath. She avoids eating any extra salt. She exercises several times a week.  She is having trouble with insomnia.  May 12, 2012: She has been doing well from a cardiac standpoint. She has had some lightheadness. She also has had some leg cramps. She has not been exercising. She has been watching her salt intake. She still works full time in the [employer].  Oct. 1, 2014:  Caitlin Walker is doing ok. Still has leg cramps. She has been busy - her was diagnosed with breast cancer. She is running around lots . No CP or dyspnea. Has occasional indigestion. She works in the [employer].   Nov. 12, 2014 She is doing ok  Sep 18, 2013:  Nov. 9, 2015:  Caitlin Walker is doing well.  She has retired. Has developed a neuroma on the bottom of her foot. BP has been a little higher.   Sep 17, 2014:   Caitlin Walker is a 75 y.o. female who presents for HTN She is doing well. No CP,  BP has been well controlled.   Nov. 14, 2016:  Doing well.  Has had some GI bleeding - has not found the source yet.  Has had upper ECG.  CT , barium swallow .  Lipids have improved significantly   Oct 01, 2015: Caitlin Walker is seen back today for eval of her HTN.  Was diagnosed with Purtussis in Feb.   Still coughing  BP is well controlled.  Remains active   03/30/2016:  Has been a rough year.  Is separated from her husband . Has had lots of anxiety  BP has been well controlled.  Occasional , rare episodes of CP   April 05, 2017:  Doing well from a cardiac standpoint .    BP has  been well controled.    Just did a 6 night stretch of work  ( 52 hours )  Feels well   April 11, 2018:  Caitlin Walker is seen today for follow-up of her hypertension.  Her blood pressure is mildly elevated today. Has not tolerated amlodipine, vasotec, Norvasc, bystolic,  Valsartan   December 04, 2019:  Caitlin Walker is seen today for follow-up of her hypertension. BP is a bit elevated.  Tries to avoid salty foods.     Had hemmorhoid surgery in Feb.     Past Medical History:  Diagnosis Date  . Chronic leukopenia DX  2011   MILD--  HEMATOLOGIST  --  DR Beryle Beams  . H/O hiatal hernia    TINY PER EGD 2007  . Hematuria   . Hemorrhoids    INTERNAL AND EXTERNAL   . History of colon polyps    BENIGN  . Hyperlipidemia   . Hypertension   . Ureterocele    LEFT  . Wears contact lenses     Past Surgical History:  Procedure Laterality Date  . APPENDECTOMY  1989  . BREAST BIOPSY Left    benign  . COLONOSCOPY N/A 08/01/2012   Procedure: COLONOSCOPY;  Surgeon: Jeryl Columbia, MD;  Location: [facility];  Service: Endoscopy;  Laterality: N/A;  pt. would like dan to do procedure  . CYSTO/  URETHRAL DILATION /  RIGHT RETROGRADE PYELOGRAM/ RIGHT URETEROSCOPY/  INSTILLATION THERAPY  06-18-2008  . CYSTOSCOPY/RETROGRADE/URETEROSCOPY Left 10/26/2013   Procedure: CYSTOSCOPY/RETROGRADE/DIAGNOSTIC DIGITAL URETEROSCOPY WITH LEFT URETERAL STENT PLACEMENT.;  Surgeon: Alexis Frock, MD;  Location: [facility];  Service: Urology;  Laterality: Left;  . DILATION AND CURETTAGE OF UTERUS  2001  . SUPRACERVICAL ABDOMINAL HYSTERECTOMY  02-25-2000   W/   BILATERAL SALPINGOOPHORECTOMY AND RECTOCELE REPAIR     Current Outpatient Medications  Medication Sig Dispense Refill  . albuterol (VENTOLIN HFA) 108 (90 Base) MCG/ACT inhaler SMARTSIG:2 Puff(s) By Mouth Every 4 Hours PRN    . aspirin 81 MG tablet Take 81 mg by mouth every evening.     . calcium carbonate (OS-CAL) 600 MG TABS Take 600 mg by mouth  daily with breakfast.     . fluticasone (FLONASE) 50 MCG/ACT nasal spray Place 2 sprays into both nostrils daily.    . hydrALAZINE (APRESOLINE) 25 MG tablet Take 1 tablet (25 mg total) by mouth 3 (three) times daily. 270 tablet 0  . losartan (COZAAR) 25 MG tablet TAKE 2 TABLETS BY MOUTH EVERY DAY 180 tablet 2  . magnesium oxide (MAG-OX) 400 MG tablet Take 400 mg by mouth every evening.     . zolpidem (AMBIEN) 10 MG tablet Take 1 tablet by mouth at bedtime as needed for sleep.  5  . spironolactone (ALDACTONE) 25 MG tablet Take 1 tablet (25 mg total) by mouth daily. 90 tablet 3   No current facility-administered medications for this visit.    Allergies:   Amlodipine, Bystolic [nebivolol hcl], Diovan [valsartan], Metoprolol, Olmesartan, Prednisone, Trazodone, Bactrim, Flomax [tamsulosin hcl], and Sulfa antibiotics    Social History:  The patient  reports that she has never smoked. She has never used smokeless tobacco. She reports that she does not drink alcohol and does not use drugs.   Family History:  The patient's family history includes Colon cancer in her sister; Emphysema in her father; Heart disease in her father; Hyperlipidemia in her brother, sister, and sister; Hypertension in her brother, brother, brother, father, sister, sister, sister, sister, and sister.    ROS: As noted in the current history.  Otherwise the review of systems is negative.  Physical Exam: Blood pressure (!) 138/94, pulse 66, height 5\' 4"  (1.626 m), weight 158 lb 6.4 oz (71.8 kg), SpO2 97 %.  GEN:  Well nourished, well developed in no acute distress HEENT: Normal NECK: No JVD; No carotid bruits LYMPHATICS: No lymphadenopathy CARDIAC: RRR , no murmurs, rubs, gallops RESPIRATORY:  Clear to auscultation without rales, wheezing or rhonchi  ABDOMEN: Soft, non-tender, non-distended MUSCULOSKELETAL:  No edema; No deformity  SKIN: Warm and dry NEUROLOGIC:  Alert and oriented x 3   EKG: December 04, 2019: Normal  sinus rhythm at 66.  No ST or T wave changes.  Recent Labs: 08/27/2019: ALT 14; BUN 13; Creatinine, Ser 0.72; Hemoglobin 12.3; Platelets 327; Potassium 3.8; Sodium 136    Lipid Panel    Component Value Date/Time   CHOL 187 03/15/2015 1219   TRIG 83 03/15/2015 1219   HDL 89 03/15/2015 1219   CHOLHDL 2.1 03/15/2015 1219   VLDL 17 03/15/2015 1219   LDLCALC 81 03/15/2015 1219      Wt Readings from Last 3 Encounters:  12/04/19 158 lb 6.4 oz (71.8 kg)  11/14/19 160 lb 3.2 oz (72.7  kg)  08/27/19 158 lb (71.7 kg)      Other studies Reviewed: Additional studies/ records that were reviewed today include: . Review of the above records demonstrates:    ASSESSMENT AND PLAN:  1.  Essential hypertension:    BP is still mildly elevate.  She has been resistent / intolerent to many meds Will try Spironolactone 25 mg a day  DC HTZT and Kdur . Bmp in 1-2 weeks.    Labs/ tests ordered today include:  Orders Placed This Encounter  Procedures  . Basic metabolic panel  . EKG 12-Lead    Mertie Moores, MD  12/04/2019 5:13 PM    [facility] [address], [place], [place]  [address] Phone: [phone]; Fax: [phone]

## 2019-12-04 NOTE — Patient Instructions (Signed)
Medication Instructions:  1) STOP HCTZ 2) STOP POTASSIUM (Kdur) 3) START SPIRONOLACTONE 25 mg daily *If you need a refill on your cardiac medications before your next appointment, please call your pharmacy*  Lab Work: Your provider recommends that you return for lab work in 1-2 weeks (you do not need to be fasting). If you have labs (blood work) drawn today and your tests are completely normal, you will receive your results only by:  [place] (if you have MyChart) OR  A paper copy in the mail If you have any lab test that is abnormal or we need to change your treatment, we will call you to review the results.  Follow-Up: Your provider recommends that you schedule a follow-up appointment in 1 months for a blood pressure check.  At [facility], you and your health needs are our priority.  As part of our continuing mission to provide you with exceptional heart care, we have created designated Provider Care Teams.  These Care Teams include your primary Cardiologist (physician) and Advanced Practice Providers (APPs -  Physician Assistants and Nurse Practitioners) who all work together to provide you with the care you need, when you need it. Your next appointment:   6 month(s) The format for your next appointment:   In Person Provider:   You may see Mertie Moores, MD or one of the following Advanced Practice Providers on your designated Care Team:    Richardson Dopp, PA-C  [place], [place]

## 2019-12-05 DIAGNOSIS — G479 Sleep disorder, unspecified: Secondary | ICD-10-CM | POA: Diagnosis not present

## 2019-12-14 ENCOUNTER — Other Ambulatory Visit: Payer: Self-pay

## 2019-12-14 ENCOUNTER — Other Ambulatory Visit: Payer: PPO

## 2019-12-14 DIAGNOSIS — I10 Essential (primary) hypertension: Secondary | ICD-10-CM

## 2019-12-15 LAB — BASIC METABOLIC PANEL WITH GFR
BUN/Creatinine Ratio: 13 (ref 12–28)
BUN: 8 mg/dL (ref 8–27)
CO2: 25 mmol/L (ref 20–29)
Calcium: 9.7 mg/dL (ref 8.7–10.3)
Chloride: 100 mmol/L (ref 96–106)
Creatinine, Ser: 0.62 mg/dL (ref 0.57–1.00)
GFR calc Af Amer: 104 mL/min/{1.73_m2}
GFR calc non Af Amer: 90 mL/min/{1.73_m2}
Glucose: 85 mg/dL (ref 65–99)
Potassium: 4.8 mmol/L (ref 3.5–5.2)
Sodium: 139 mmol/L (ref 134–144)

## 2019-12-25 DIAGNOSIS — I10 Essential (primary) hypertension: Secondary | ICD-10-CM | POA: Diagnosis not present

## 2019-12-25 DIAGNOSIS — G4700 Insomnia, unspecified: Secondary | ICD-10-CM | POA: Diagnosis not present

## 2019-12-25 DIAGNOSIS — J45909 Unspecified asthma, uncomplicated: Secondary | ICD-10-CM | POA: Diagnosis not present

## 2019-12-25 DIAGNOSIS — M818 Other osteoporosis without current pathological fracture: Secondary | ICD-10-CM | POA: Diagnosis not present

## 2019-12-25 DIAGNOSIS — E7800 Pure hypercholesterolemia, unspecified: Secondary | ICD-10-CM | POA: Diagnosis not present

## 2019-12-26 DIAGNOSIS — Z124 Encounter for screening for malignant neoplasm of cervix: Secondary | ICD-10-CM | POA: Diagnosis not present

## 2019-12-26 DIAGNOSIS — G588 Other specified mononeuropathies: Secondary | ICD-10-CM | POA: Diagnosis not present

## 2019-12-26 DIAGNOSIS — Z6827 Body mass index (BMI) 27.0-27.9, adult: Secondary | ICD-10-CM | POA: Diagnosis not present

## 2019-12-28 DIAGNOSIS — M9903 Segmental and somatic dysfunction of lumbar region: Secondary | ICD-10-CM | POA: Diagnosis not present

## 2019-12-28 DIAGNOSIS — M545 Low back pain: Secondary | ICD-10-CM | POA: Diagnosis not present

## 2019-12-29 ENCOUNTER — Other Ambulatory Visit: Payer: Self-pay | Admitting: Family Medicine

## 2019-12-29 DIAGNOSIS — M8588 Other specified disorders of bone density and structure, other site: Secondary | ICD-10-CM

## 2020-01-01 ENCOUNTER — Ambulatory Visit: Payer: PPO

## 2020-01-01 ENCOUNTER — Ambulatory Visit
Admission: RE | Admit: 2020-01-01 | Discharge: 2020-01-01 | Disposition: A | Discharge status: Home / Self Care | Payer: PPO | Source: Ambulatory Visit

## 2020-01-01 ENCOUNTER — Other Ambulatory Visit: Payer: Self-pay

## 2020-01-01 VITALS — BP 140/88 | HR 78 | Ht 64.0 in | Wt 159.6 lb

## 2020-01-01 DIAGNOSIS — Z1231 Encounter for screening mammogram for malignant neoplasm of breast: Secondary | ICD-10-CM

## 2020-01-01 DIAGNOSIS — M545 Low back pain: Secondary | ICD-10-CM | POA: Diagnosis not present

## 2020-01-01 DIAGNOSIS — M9903 Segmental and somatic dysfunction of lumbar region: Secondary | ICD-10-CM | POA: Diagnosis not present

## 2020-01-01 DIAGNOSIS — I10 Essential (primary) hypertension: Secondary | ICD-10-CM

## 2020-01-01 MED ORDER — LOSARTAN POTASSIUM 100 MG PO TABS: 100.0000 mg | ORAL_TABLET | Freq: Every day | ORAL | 3 refills | Status: DC

## 2020-01-01 NOTE — Patient Instructions (Signed)
Medication Instructions:  Your physician has recommended you make the following change in your medication:  1-Increase Losartan 100 mg by mouth daily.  *If you need a refill on your cardiac medications before your next appointment, please call your pharmacy*  Lab Work: Your physician recommends that you return for lab work in: 3 weeks for BMET  If you have labs (blood work) drawn today and your tests are completely normal, you will receive your results only by: [place] Kitchen MyChart Message (if you have MyChart) OR . A paper copy in the mail If you have any lab test that is abnormal or we need to change your treatment, we will call you to review the results.  Follow-Up: At [facility], you and your health needs are our priority.  As part of our continuing mission to provide you with exceptional heart care, we have created designated Provider Care Teams.  These Care Teams include your primary Cardiologist (physician) and Advanced Practice Providers (APPs -  Physician Assistants and Nurse Practitioners) who all work together to provide you with the care you need, when you need it.  We recommend signing up for the patient portal called "MyChart".  Sign up information is provided on this After Visit Summary.  MyChart is used to connect with patients for Virtual Visits (Telemedicine).  Patients are able to view lab/test results, encounter notes, upcoming appointments, etc.  Non-urgent messages can be sent to your provider as well.   To learn more about what you can do with MyChart, go to [email or web address].    Your next appointment:   3 month(s)  The format for your next appointment:   In Person  Provider:   You may see Mertie Moores, MD or one of the following Advanced Practice Providers on your designated Care Team:    Richardson Dopp, PA-C  [place], [place]

## 2020-01-01 NOTE — Progress Notes (Signed)
1.) Reason for visit: BP check  2.) Name of MD requesting visit: Dr. Acie Fredrickson  3.) H&P: Patient has history of hypertension and hyperlipidemia. Last office visit patient was told to stop HCTZ and Kdur, and she started Spirolactone 25 mg daily.   4.) ROS related to problem: Patient's BP is 140/88 HR 78 O2 97%. Patient's weight 159.6 lbs and height 5\' 4" . Patient stated her SBP has been as high as 170's and her DBP 90's to 100's. Patient has been taking her medications as directed.   5.) Assessment and plan per MD: Dr. Acie Fredrickson reviewed patient's chart and vital signs and recommend increasing losartan to 100 mg daily, getting lab work in 3 weeks, and see provider back in the office in 3 months.

## 2020-01-02 DIAGNOSIS — M9903 Segmental and somatic dysfunction of lumbar region: Secondary | ICD-10-CM | POA: Diagnosis not present

## 2020-01-02 DIAGNOSIS — M545 Low back pain: Secondary | ICD-10-CM | POA: Diagnosis not present

## 2020-01-04 DIAGNOSIS — M545 Low back pain: Secondary | ICD-10-CM | POA: Diagnosis not present

## 2020-01-04 DIAGNOSIS — M9903 Segmental and somatic dysfunction of lumbar region: Secondary | ICD-10-CM | POA: Diagnosis not present

## 2020-01-14 ENCOUNTER — Other Ambulatory Visit: Payer: Self-pay | Admitting: Cardiovascular Disease

## 2020-01-15 DIAGNOSIS — M9903 Segmental and somatic dysfunction of lumbar region: Secondary | ICD-10-CM | POA: Diagnosis not present

## 2020-01-15 DIAGNOSIS — M545 Low back pain: Secondary | ICD-10-CM | POA: Diagnosis not present

## 2020-01-18 DIAGNOSIS — M9903 Segmental and somatic dysfunction of lumbar region: Secondary | ICD-10-CM | POA: Diagnosis not present

## 2020-01-18 DIAGNOSIS — M545 Low back pain: Secondary | ICD-10-CM | POA: Diagnosis not present

## 2020-01-22 ENCOUNTER — Other Ambulatory Visit: Payer: PPO | Admitting: *Deleted

## 2020-01-22 ENCOUNTER — Other Ambulatory Visit: Payer: Self-pay

## 2020-01-22 DIAGNOSIS — I10 Essential (primary) hypertension: Secondary | ICD-10-CM

## 2020-01-22 LAB — BASIC METABOLIC PANEL WITH GFR
BUN/Creatinine Ratio: 16 (ref 12–28)
BUN: 10 mg/dL (ref 8–27)
CO2: 26 mmol/L (ref 20–29)
Calcium: 9.6 mg/dL (ref 8.7–10.3)
Chloride: 101 mmol/L (ref 96–106)
Creatinine, Ser: 0.64 mg/dL (ref 0.57–1.00)
GFR calc Af Amer: 103 mL/min/{1.73_m2}
GFR calc non Af Amer: 89 mL/min/{1.73_m2}
Glucose: 80 mg/dL (ref 65–99)
Potassium: 4.3 mmol/L (ref 3.5–5.2)
Sodium: 136 mmol/L (ref 134–144)

## 2020-01-26 DIAGNOSIS — R0981 Nasal congestion: Secondary | ICD-10-CM | POA: Diagnosis not present

## 2020-01-26 DIAGNOSIS — Z20822 Contact with and (suspected) exposure to covid-19: Secondary | ICD-10-CM | POA: Diagnosis not present

## 2020-02-01 DIAGNOSIS — M545 Low back pain: Secondary | ICD-10-CM | POA: Diagnosis not present

## 2020-02-01 DIAGNOSIS — M9903 Segmental and somatic dysfunction of lumbar region: Secondary | ICD-10-CM | POA: Diagnosis not present

## 2020-02-15 DIAGNOSIS — M9903 Segmental and somatic dysfunction of lumbar region: Secondary | ICD-10-CM | POA: Diagnosis not present

## 2020-02-15 DIAGNOSIS — M5451 Vertebrogenic low back pain: Secondary | ICD-10-CM | POA: Diagnosis not present

## 2020-02-29 DIAGNOSIS — G4700 Insomnia, unspecified: Secondary | ICD-10-CM | POA: Diagnosis not present

## 2020-02-29 DIAGNOSIS — M818 Other osteoporosis without current pathological fracture: Secondary | ICD-10-CM | POA: Diagnosis not present

## 2020-02-29 DIAGNOSIS — I10 Essential (primary) hypertension: Secondary | ICD-10-CM | POA: Diagnosis not present

## 2020-02-29 DIAGNOSIS — E7800 Pure hypercholesterolemia, unspecified: Secondary | ICD-10-CM | POA: Diagnosis not present

## 2020-02-29 DIAGNOSIS — J45909 Unspecified asthma, uncomplicated: Secondary | ICD-10-CM | POA: Diagnosis not present

## 2020-03-18 DIAGNOSIS — H40023 Open angle with borderline findings, high risk, bilateral: Secondary | ICD-10-CM | POA: Diagnosis not present

## 2020-03-26 ENCOUNTER — Other Ambulatory Visit: Payer: Self-pay | Admitting: Family Medicine

## 2020-03-26 DIAGNOSIS — M8588 Other specified disorders of bone density and structure, other site: Secondary | ICD-10-CM

## 2020-04-01 DIAGNOSIS — M7918 Myalgia, other site: Secondary | ICD-10-CM | POA: Diagnosis not present

## 2020-04-01 DIAGNOSIS — J45909 Unspecified asthma, uncomplicated: Secondary | ICD-10-CM | POA: Diagnosis not present

## 2020-04-01 DIAGNOSIS — E7800 Pure hypercholesterolemia, unspecified: Secondary | ICD-10-CM | POA: Diagnosis not present

## 2020-04-01 DIAGNOSIS — K219 Gastro-esophageal reflux disease without esophagitis: Secondary | ICD-10-CM | POA: Diagnosis not present

## 2020-04-01 DIAGNOSIS — H11441 Conjunctival cysts, right eye: Secondary | ICD-10-CM | POA: Diagnosis not present

## 2020-04-01 DIAGNOSIS — M818 Other osteoporosis without current pathological fracture: Secondary | ICD-10-CM | POA: Diagnosis not present

## 2020-04-01 DIAGNOSIS — I10 Essential (primary) hypertension: Secondary | ICD-10-CM | POA: Diagnosis not present

## 2020-04-01 DIAGNOSIS — G4700 Insomnia, unspecified: Secondary | ICD-10-CM | POA: Diagnosis not present

## 2020-04-01 NOTE — Progress Notes (Signed)
Cardiology Office Note:    Date:  04/02/2020   ID:  Caitlin Walker, DOB May 03, 1946, MRN [id number]  PCP:  Lawerance Cruel, MD  [facility] Cardiologist:  Mertie Moores, MD  [facility] Electrophysiologist:  None   Referring MD: Lawerance Cruel, MD   Chief Complaint:  Follow-up (HTN)    Patient Profile:    Caitlin Walker is a 74 y.o. female with:   Hypertension   Intol of Amlodipine, enalapril, nebivolol, valsartan  Hyperlipidemia   Hx of GI bleeding     History of Present Illness:    Caitlin Walker was last seen by Dr. Acie Fredrickson in 8/21.  Her HCTZ and K+ were DCd and she was placed on Spironolactone.  After this visit, her Losartan was increased to 100 mg once daily.  She returns for f/u.  She is here alone.  She brings in a list of her blood pressures.  Her blood pressures range 140s/80s up to 170s/100s.  She exercises on a regular basis.  She also maintains a low-salt diet.  She seems to be tolerating her medications.  She has occasional atypical chest pains that occur at rest.  She has no exertional chest discomfort.  She has not had significant shortness of breath, orthopnea, leg swelling, syncope.      Past Medical History:  Diagnosis Date  . Chronic leukopenia DX  2011   MILD--  HEMATOLOGIST  --  DR Beryle Beams  . H/O hiatal hernia    TINY PER EGD 2007  . Hematuria   . Hemorrhoids    INTERNAL AND EXTERNAL   . History of colon polyps    BENIGN  . Hyperlipidemia   . Hypertension   . Ureterocele    LEFT  . Wears contact lenses     Current Medications: Current Meds  Medication Sig  . albuterol (VENTOLIN HFA) 108 (90 Base) MCG/ACT inhaler SMARTSIG:2 Puff(s) By Mouth Every 4 Hours PRN  . calcium carbonate (OS-CAL) 600 MG TABS Take 600 mg by mouth daily with breakfast.   . fluticasone (FLONASE) 50 MCG/ACT nasal spray Place 2 sprays into both nostrils daily.  . hydrALAZINE (APRESOLINE) 25 MG tablet TAKE 1 TABLET(25 MG) BY MOUTH THREE TIMES DAILY  .  losartan (COZAAR) 100 MG tablet Take 1 tablet (100 mg total) by mouth daily.  . magnesium oxide (MAG-OX) 400 MG tablet Take 400 mg by mouth every evening.   Marland Kitchen spironolactone (ALDACTONE) 25 MG tablet Take 1 tablet (25 mg total) by mouth daily.  Marland Kitchen zolpidem (AMBIEN) 10 MG tablet Take 1 tablet by mouth at bedtime as needed for sleep.     Allergies:   Amlodipine, Bystolic [nebivolol hcl], Diovan [valsartan], Metoprolol, Olmesartan, Prednisone, Trazodone, Bactrim, Flomax [tamsulosin hcl], and Sulfa antibiotics   Social History   Tobacco Use  . Smoking status: Never Smoker  . Smokeless tobacco: Never Used  Vaping Use  . Vaping Use: Never used  Substance Use Topics  . Alcohol use: No  . Drug use: No     Family Hx: The patient's family history includes Colon cancer in her sister; Emphysema in her father; Heart disease in her father; Hyperlipidemia in her brother, sister, and sister; Hypertension in her brother, brother, brother, father, sister, sister, sister, sister, and sister.  ROS   EKGs/Labs/Other Test Reviewed:    EKG:  EKG is not ordered today.  The ekg ordered today demonstrates n/a  Recent Labs: 08/27/2019: ALT 14; Hemoglobin 12.3; Platelets 327 01/22/2020: BUN 10; Creatinine, Ser  0.64; Potassium 4.3; Sodium 136   Recent Lipid Panel Lab Results  Component Value Date/Time   CHOL 187 03/15/2015 12:19 PM   TRIG 83 03/15/2015 12:19 PM   HDL 89 03/15/2015 12:19 PM   CHOLHDL 2.1 03/15/2015 12:19 PM   LDLCALC 81 03/15/2015 12:19 PM      Risk Assessment/Calculations:      Physical Exam:    VS:  BP (!) 150/90   Pulse 74   Ht 5\' 4"  (1.626 m)   Wt 158 lb (71.7 kg)   SpO2 98%   BMI 27.12 kg/m     Wt Readings from Last 3 Encounters:  04/02/20 158 lb (71.7 kg)  01/01/20 159 lb 9.6 oz (72.4 kg)  12/04/19 158 lb 6.4 oz (71.8 kg)     Constitutional:      Appearance: Healthy appearance. Not in distress.  Pulmonary:     Effort: Pulmonary effort is normal.     Breath  sounds: No wheezing. No rales.  Cardiovascular:     Normal rate. Regular rhythm. Normal S1. Normal S2.     Murmurs: There is no murmur.  Edema:    Peripheral edema absent.  Musculoskeletal:     Cervical back: Neck supple. Skin:    General: Skin is warm and dry.  Neurological:     General: No focal deficit present.     Mental Status: Alert and oriented to person, place and time.      ASSESSMENT & PLAN:    1. Essential hypertension Blood pressure remains uncontrolled.  She has been intolerant to amlodipine, enalapril, nebivolol and valsartan.  She has never tried diltiazem.  We discussed the potential for adding diltiazem versus adjusting the current medications she takes.  She would like to adjust her current medications first.  Therefore, I will increase her hydralazine to 50 mg 3 times a day.  Continue current dose of spironolactone and losartan.  Obtain follow-up BMET today.  Follow-up in 2 to 3 months.  If blood pressure remains uncontrolled, consider adding diltiazem CD 120 mg daily.    Dispo:  Return in about 3 months (around 07/01/2020) for Routine Follow Up, w/ Dr. Acie Fredrickson, or Richardson Dopp, PA-C, in person.   Medication Adjustments/Labs and Tests Ordered: Current medicines are reviewed at length with the patient today.  Concerns regarding medicines are outlined above.  Tests Ordered: No orders of the defined types were placed in this encounter.  Medication Changes: No orders of the defined types were placed in this encounter.   Signed, Richardson Dopp, PA-C  04/02/2020 2:22 PM    [facility] [place], [place], [place]  [address] Phone: [phone]; Fax: [phone]

## 2020-04-02 ENCOUNTER — Encounter: Payer: Self-pay | Admitting: Physician Assistant

## 2020-04-02 ENCOUNTER — Ambulatory Visit: Payer: PPO | Admitting: Physician Assistant

## 2020-04-02 ENCOUNTER — Other Ambulatory Visit: Payer: Self-pay

## 2020-04-02 VITALS — BP 150/90 | HR 74 | Ht 64.0 in | Wt 158.0 lb

## 2020-04-02 DIAGNOSIS — I10 Essential (primary) hypertension: Secondary | ICD-10-CM | POA: Diagnosis not present

## 2020-04-02 MED ORDER — HYDRALAZINE HCL 50 MG PO TABS: 50.0000 mg | ORAL_TABLET | Freq: Three times a day (TID) | ORAL | 1 refills | Status: DC

## 2020-04-02 NOTE — Patient Instructions (Addendum)
Medication Instructions:  Your physician has recommended you make the following change in your medication:   1) Increase Hydralazine to 50 mg, 1 tablet by mouth three times a day  *If you need a refill on your cardiac medications before your next appointment, please call your pharmacy*  Lab Work: You will have labs drawn today: BMET  If you have labs (blood work) drawn today and your tests are completely normal, you will receive your results only by: Marland Kitchen MyChart Message (if you have MyChart) OR . A paper copy in the mail If you have any lab test that is abnormal or we need to change your treatment, we will call you to review the results.  Testing/Procedures: None ordered today  Follow-Up: On 07/08/2020 at 2:00PM with Mertie Moores, MD  Other Instructions Call if blood pressure is over 170/100

## 2020-04-03 LAB — BASIC METABOLIC PANEL WITH GFR
BUN/Creatinine Ratio: 14 (ref 12–28)
BUN: 10 mg/dL (ref 8–27)
CO2: 27 mmol/L (ref 20–29)
Calcium: 9.3 mg/dL (ref 8.7–10.3)
Chloride: 101 mmol/L (ref 96–106)
Creatinine, Ser: 0.69 mg/dL (ref 0.57–1.00)
GFR calc Af Amer: 101 mL/min/{1.73_m2}
GFR calc non Af Amer: 87 mL/min/{1.73_m2}
Glucose: 78 mg/dL (ref 65–99)
Potassium: 4.7 mmol/L (ref 3.5–5.2)
Sodium: 138 mmol/L (ref 134–144)

## 2020-04-17 ENCOUNTER — Ambulatory Visit: Payer: PPO | Attending: Family Medicine | Admitting: Physical Therapy

## 2020-04-17 ENCOUNTER — Encounter: Payer: Self-pay | Admitting: Physical Therapy

## 2020-04-17 ENCOUNTER — Other Ambulatory Visit: Payer: Self-pay

## 2020-04-17 DIAGNOSIS — M533 Sacrococcygeal disorders, not elsewhere classified: Secondary | ICD-10-CM | POA: Diagnosis present

## 2020-04-17 DIAGNOSIS — R2689 Other abnormalities of gait and mobility: Secondary | ICD-10-CM

## 2020-04-17 DIAGNOSIS — M791 Myalgia, unspecified site: Secondary | ICD-10-CM | POA: Insufficient documentation

## 2020-04-17 NOTE — Patient Instructions (Signed)
  Avoid straining pelvic floor, abdominal muscles , spine  Use log rolling technique instead of getting out of bed with your neck or the sit-up     Log rolling into and out of bed   Log rolling into and out of bed If getting out of bed on R side, Bent knees, scoot hips/ shoulder to L  Raise R arm completely overhead, rolling onto armpit  Then lower bent knees to bed to get into complete side lying position  Then drop legs off bed, and push up onto R elbow/forearm, and use L hand to push onto the bed   ___  Lengthen Back rib by _ shoulder    Lie on   side , pillow between knees  Pull  arm overhead over mattress, grab the edge of mattress,pull it upward, drawing elbow away from ears  Breathing   Open book (handout)  Lying on  _ side , rotating  __ only this week   ____   Caitlin Walker 45 Degrees  Lying with hips and knees bent 45, one pillow between knees and ankles. Heel together, toes apart like ballerina,  Lift knee with exhale while pressing heels together. Be sure pelvis does not roll backward. Do not arch back. Do 20 times, each leg, 2 times per day.     Complimentary stretch: Figure-4  5 breaths  Towel, strap not rubber band    * Keep pelvis levelled with tactile cue with hand under back of hips  * Slide the ankle of the supporting foot out to decrease the angle which can help level the pelvis   Cross L thigh over R thigh, , scoot hips to L and rocking knees to R  10 reps

## 2020-04-17 NOTE — Therapy (Addendum)
[place] MAIN [facility] SERVICES [address] [place], [place], [address] Phone: [phone]   Fax:  [phone]  Physical Therapy Evaluation  Patient Details  Name: Caitlin Walker MRN: [id number] Date of Birth: 1945-07-26 Referring Provider (PT): Milagros Evener MD   Encounter Date: 04/17/2020   PT End of Session - 04/17/20 1055    Visit Number 1    Number of Visits 10    Date for PT Re-Evaluation 06/26/20    PT Start Time 6384    PT Stop Time 1102    PT Time Calculation (min) 60 min           Past Medical History:  Diagnosis Date  . Chronic leukopenia DX  2011   MILD--  HEMATOLOGIST  --  DR Beryle Beams  . H/O hiatal hernia    TINY PER EGD 2007  . Hematuria   . Hemorrhoids    INTERNAL AND EXTERNAL   . History of colon polyps    BENIGN  . Hyperlipidemia   . Hypertension   . Ureterocele    LEFT  . Wears contact lenses     Past Surgical History:  Procedure Laterality Date  . APPENDECTOMY  1989  . BREAST BIOPSY Left    benign  . COLONOSCOPY N/A 08/01/2012   Procedure: COLONOSCOPY;  Surgeon: Jeryl Columbia, MD;  Location: [facility];  Service: Endoscopy;  Laterality: N/A;  pt. would like dan to do procedure  . CYSTO/  URETHRAL DILATION /  RIGHT RETROGRADE PYELOGRAM/ RIGHT URETEROSCOPY/  INSTILLATION THERAPY  06-18-2008  . CYSTOSCOPY/RETROGRADE/URETEROSCOPY Left 10/26/2013   Procedure: CYSTOSCOPY/RETROGRADE/DIAGNOSTIC DIGITAL URETEROSCOPY WITH LEFT URETERAL STENT PLACEMENT.;  Surgeon: Alexis Frock, MD;  Location: [facility];  Service: Urology;  Laterality: Left;  . DILATION AND CURETTAGE OF UTERUS  2001  . SUPRACERVICAL ABDOMINAL HYSTERECTOMY  02-25-2000   W/   BILATERAL SALPINGOOPHORECTOMY AND RECTOCELE REPAIR    There were no vitals filed for this visit.    Subjective Assessment - 04/17/20 1010    Subjective 1) Buttock pain started March 2020  with increased sitting after retirement. Pt noticed the  pain felt like someone was pulling her buttocks apart. Numbness occurred along the tailbone region bilaterally ( pt points). The numbness and pain now travels to the genital area and it subsides when she stands up. Denied falls onto her tailbone, incotninence, weakness in her legs, pain interrupting her sleep.  Pain started 5/10 and now it is a 10/10. Pt is able to sit for only 10 min before the numbness. pain occurs. Softer surfaces of couches make pain worst and hard surfaces help the pain not occur too quickly.      2) Prolapse and incomplete emptying: pt notices she goes to urinate but she has to come back to urinate again to complete.     Pertinent History 3 vaginal deliveries with one episiotomy, abdominal hysterectomy, appendectomy, Ureterocele, Hx of colon polyps, hemorrhoids. Bowel movements occur 2 x day . Pt has had to strain with BMS since she had to cut back on her greens . Daily water intake : 4 ( 16 fl oz) of water.  Pt walks 3 miles a day    Patient Stated Goals get rid of pain and be able to sit.    Currently in Pain? Yes    Pain Score 8     Pain Location Sacrum              [facility] PT Assessment -  04/17/20 1022      Assessment   Medical Diagnosis buttock pain    Referring Provider (PT) Rankins, [place] MD      Precautions   Precautions None      Restrictions   Weight Bearing Restrictions No      Balance Screen   Has the patient fallen in the past 6 months No      [place] residence    Type of [place] to enter    Entrance Stairs-Number of Steps 1    [place] One level      Prior Function   Level of [place] Independent      Observation/Other Assessments   Scoliosis R iliac crest. / R shoulder higher.  Supine: levelled ASIS/ malleoli      Coordination   Gross Motor Movements are Fluid and Coordinated --   chest breathing     AROM   Overall AROM Comments R rotation restriction ~25%, L  45pre Tx, rotation 45% B post Tx      Strength   Overall Strength Comments B hip abd 3/5      Palpation   Spinal mobility hypomobile C/T and T/L  junction, tightness at L paraspinal/medial scapula    Palpation comment slight restrictions over abdomen, appendectomy/ hysterectomy scars      Ambulation/Gait   Gait velocity 1.32 m/s    Gait Comments excessive R pelvic rotation pre Tx, more reciprocal gait pattern post Tx                      Objective measurements completed on examination: See above findings.     Pelvic Floor Special Questions - 04/17/20 1041    Diastasis Recti neg    External Perineal Exam upward lift but no lengthening on inhalation due to chest breathing                         PT Long Term Goals - 04/17/20 1043      PT LONG TERM GOAL #1   Title Pt will demo levelled shoulders/ pelvis across 2 visits in order to minimize pain and increase sitting    Time 2    Period Weeks    Status New    Target Date 05/01/20      PT LONG TERM GOAL #2   Title Pt will demo decreased L upper Q mm tensions, C/T, T/L hypomobility, abdominal scar restrictions in order to maintain reciprocal pattern between thorax and pelvis for her 3 miles walks    Time 6    Period Weeks    Status New    Target Date 05/29/20      PT LONG TERM GOAL #3   Title Pt will be ale to sit for 30 min without pain in order to go out with friends for dinner    Time 6    Period Weeks    Status New    Target Date 05/29/20      PT LONG TERM GOAL #4   Title Pt will increase hip abduction strength from 3/5 B to > 4/5 B in order to minimize risk for worsening of pelvic dysfunctions and optimize pelvic girdle stability for walking    Time 4    Period Weeks    Status New    Target Date 05/15/20      PT LONG TERM GOAL #5  Title Pt will be IND with flexibilty routine to minimize mm tightness around hips and legs and promote nutation of sacrum    Time 10    Period Weeks     Status New    Target Date 06/26/20      Additional Long Term Goals   Additional Long Term Goals --      PT LONG TERM GOAL #6   Title Pt will demo more nutation of sacrum to promote longer sitting periods > 30 min    Time 6    Period Weeks    Status New    Target Date 05/29/20      PT LONG TERM GOAL #7   Title Pt will decrease her FOTO score for Pelvic Pain from 58 pts to < 48 pts in order to return to ADLs    Time 10    Period Weeks    Status New    Target Date 06/26/20                  Plan - 04/17/20 1146    Clinical Impression Statement  Pt is a 74 yo who presents with buttock pain and  incomplete emptying related to prolapse. These deficits impact her ability to sit and ADLs.  Buttock pain started March 2020  with increased sitting after retirement. Numbness occurred along the tailbone/ sacrum region bilaterally . The numbness and pain now travels to the genital area and it subsides when she stands up. Denied falls onto her tailbone, incontinence, weakness in her legs, pain interrupting her sleep.     Pt's musculoskeletal assessment revealed pelvic obliquities, limited nutation of sacrum and coccyx, uneven shoulder height, hypomobile of spine, gait deviations, increased abdominal scar restrictions, dyscoordination and strength of pelvic floor mm, weak hip weakness, poor body mechanics which places strain on the abdominal/pelvic floor mm. These are deficits that indicate an ineffective intraabdominal pressure system associated with increased risk for pt's Sx.   Pt was provided education on etiology of Sx with anatomy, physiology explanation with images along with the benefits of customized pelvic PT Tx based on pt's medical conditions and musculoskeletal deficits.  Explained the physiology of deep core mm coordination and roles of pelvic floor function in urination, defecation, sexual function, and postural control with deep core mm system.   Following Tx today which pt  tolerated without complaints, pt demo'd equal alignment of pelvic girdle, increased mobility of sacrum and coccyx. Initiated mobility HEP to improve thoracic and strength at SIJ. Plan to continue adding stretches to compliment her 3 miles walks as pt demo'd increased mm tensions. Educated pt on proper sitting position to minimize slumped posture and advised to avoid leaning onto one side of pelvis when sitting and to minimize time sitting reclined in recliner. Pt benefits from skilled PT.      Examination-Activity Limitations Sit    Stability/Clinical Decision Making Evolving/Moderate complexity    Clinical Decision Making Moderate    Rehab Potential Good    PT Frequency 1x / week    PT Duration Other (comment)   10   PT Treatment/Interventions ADLs/Self Care Home Management;Moist Heat;Traction;Therapeutic exercise;Neuromuscular re-education;Balance training;Manual lymph drainage;Manual techniques;Wheelchair mobility training;Canalith Repostioning;Taping;Dry needling;Patient/family education;Functional mobility training;Scar mobilization;Orthotic Fit/Training;Compression bandaging;Therapeutic activities;Stair training    Consulted and Agree with Plan of Care Patient           Patient will benefit from skilled therapeutic intervention in order to improve the following deficits and impairments:  Pain,Decreased scar mobility,Decreased mobility,Decreased strength,Postural  dysfunction,Improper body mechanics,Decreased coordination,Abnormal gait,Hypermobility,Difficulty walking,Decreased range of motion,Decreased endurance,Increased muscle spasms,Hypomobility,Impaired flexibility  Visit Diagnosis: Sacrococcygeal disorders, not elsewhere classified  Myalgia  Other abnormalities of gait and mobility     Problem List Patient Active Problem List   Diagnosis Date Noted  . Cough variant asthma vs UCAS 11/04/2015  . Leukopenia 11/02/2012  . Chest pain 11/20/2010  . Hypertension 08/14/2010     Jerl Mina ,PT, DPT, E-RYT  04/17/2020, 11:48 AM  [place] MAIN [facility] SERVICES [address] [place], [place], [address] Phone: [phone]   Fax:  [phone]  Name: Caitlin Walker MRN: [id number] Date of Birth: [birth year]

## 2020-04-18 NOTE — Addendum Note (Signed)
Addended by: Jerl Mina on: 04/18/2020 08:55 AM   Modules accepted: Orders

## 2020-04-24 ENCOUNTER — Ambulatory Visit: Payer: PPO | Admitting: Physical Therapy

## 2020-04-24 ENCOUNTER — Other Ambulatory Visit: Payer: Self-pay

## 2020-04-24 DIAGNOSIS — M791 Myalgia, unspecified site: Secondary | ICD-10-CM

## 2020-04-24 DIAGNOSIS — M533 Sacrococcygeal disorders, not elsewhere classified: Secondary | ICD-10-CM | POA: Diagnosis not present

## 2020-04-24 DIAGNOSIS — R2689 Other abnormalities of gait and mobility: Secondary | ICD-10-CM

## 2020-04-24 NOTE — Therapy (Signed)
[place] MAIN [facility] SERVICES [address] [place], [place], [address] Phone: [phone]   Fax:  [phone]  Physical Therapy Treatment  Patient Details  Name: Caitlin Walker MRN: [id number] Date of Birth: 09/24/1945 Referring Provider (PT): Milagros Evener MD   Encounter Date: 04/24/2020   PT End of Session - 04/24/20 1149    Visit Number 2    Number of Visits 10    Date for PT Re-Evaluation 06/26/20    PT Start Time 1103    PT Stop Time 1158    PT Time Calculation (min) 55 min           Past Medical History:  Diagnosis Date  . Chronic leukopenia DX  2011   MILD--  HEMATOLOGIST  --  DR Beryle Beams  . H/O hiatal hernia    TINY PER EGD 2007  . Hematuria   . Hemorrhoids    INTERNAL AND EXTERNAL   . History of colon polyps    BENIGN  . Hyperlipidemia   . Hypertension   . Ureterocele    LEFT  . Wears contact lenses     Past Surgical History:  Procedure Laterality Date  . APPENDECTOMY  1989  . BREAST BIOPSY Left    benign  . COLONOSCOPY N/A 08/01/2012   Procedure: COLONOSCOPY;  Surgeon: Jeryl Columbia, MD;  Location: [facility];  Service: Endoscopy;  Laterality: N/A;  pt. would like dan to do procedure  . CYSTO/  URETHRAL DILATION /  RIGHT RETROGRADE PYELOGRAM/ RIGHT URETEROSCOPY/  INSTILLATION THERAPY  06-18-2008  . CYSTOSCOPY/RETROGRADE/URETEROSCOPY Left 10/26/2013   Procedure: CYSTOSCOPY/RETROGRADE/DIAGNOSTIC DIGITAL URETEROSCOPY WITH LEFT URETERAL STENT PLACEMENT.;  Surgeon: Alexis Frock, MD;  Location: [facility];  Service: Urology;  Laterality: Left;  . DILATION AND CURETTAGE OF UTERUS  2001  . SUPRACERVICAL ABDOMINAL HYSTERECTOMY  02-25-2000   W/   BILATERAL SALPINGOOPHORECTOMY AND RECTOCELE REPAIR    There were no vitals filed for this visit.   Subjective Assessment - 04/24/20 1107    Subjective Pt felt shoulder pain due to her bursitis when doing thee overhead stretch so she did not do it     Pertinent History 3 vaginal deliveries with one episiotomy, abdominal hysterectomy, appendectomy, Ureterocele, Hx of colon polyps, hemorrhoids. Bowel movements occur 2 x day . Pt has had to strain with BMS since she had to cut back on her greens . Daily water intake : 4 ( 16 fl oz) of water.  Pt walks 3 miles a day    Patient Stated Goals get rid of pain and be able to sit.              [facility] PT Assessment - 04/24/20 1108      Sit to Stand   Comments poor technique, straining      AROM   Overall AROM Comments WFL at rotation/side flexion      Palpation   Palpation comment fascial restrictions over appendectomy scar, tightness at B ischial tuberosity, SIJ hypomobility      Ambulation/Gait   Gait Comments reciporcal pattern between thorax/ pelvis , increase lumbar lordosis                         [facility] Adult PT Treatment/Exercise - 04/24/20 1108      Therapeutic Activites    Therapeutic Activities Other Therapeutic Activities    Other Therapeutic Activities body mechanics with gait and sit to stand  Neuro Re-ed    Neuro Re-ed Details  --   sit to stand, higher knees with walking     Exercises   Exercises --   see pt instructions,     Manual Therapy   Manual therapy comments fascial release over scars, mobilization at sacrum, STM/MWM at ischial tuberosity to promote mobility at SIJ, nutation/ coccyx ext                       PT Long Term Goals - 04/17/20 1043      PT LONG TERM GOAL #1   Title Pt will demo levelled shoulders/ pelvis across 2 visits in order to minimize pain and increase sitting    Time 2    Period Weeks    Status New    Target Date 05/01/20      PT LONG TERM GOAL #2   Title Pt will demo decreased L upper Q mm tensions, C/T, T/L hypomobility, abdominal scar restrictions in order to maintain reciprocal pattern between thorax and pelvis for her 3 miles walks    Time 6    Period Weeks    Status New    Target Date  05/29/20      PT LONG TERM GOAL #3   Title Pt will be ale to sit for 30 min without pain in order to go out with friends for dinner    Time 6    Period Weeks    Status New    Target Date 05/29/20      PT LONG TERM GOAL #4   Title Pt will increase hip abduction strength from 3/5 B to > 4/5 B in order to minimize risk for worsening of pelvic dysfunctions and optimize pelvic girdle stability for walking    Time 4    Period Weeks    Status New    Target Date 05/15/20      PT LONG TERM GOAL #5   Title Pt will be IND with flexibilty routine to minimize mm tightness around hips and legs and promote nutation of sacrum    Time 10    Period Weeks    Status New    Target Date 06/26/20      Additional Long Term Goals   Additional Long Term Goals --      PT LONG TERM GOAL #6   Title Pt will demo more nutation of sacrum to promote longer sitting periods > 30 min    Time 6    Period Weeks    Status New    Target Date 05/29/20      PT LONG TERM GOAL #7   Title Pt will decrease her FOTO score for Pelvic Pain from 58 pts to < 48 pts in order to return to ADLs    Time 10    Period Weeks    Status New    Target Date 06/26/20                 Plan - 04/24/20 1154    Clinical Impression Statement Pt showed improved mobility in her spine. Focused today on releasing fascial restrictions over abdominal scars and improving mm flexibility around sacrum and hips. Pt demo'd improved technique with sit to stand and walking post training. Manual Tx helped to decrease L buttock pain from 10/10 to 6/10 and decreased R buttock from 8/10 to 0/10 with sitting post Tx.   Plan to add multifidis strengthening at next session.  Pt continues  to benefit from skilled PT      Examination-Activity Limitations Sit    Stability/Clinical Decision Making Evolving/Moderate complexity    Rehab Potential Good    PT Frequency 1x / week    PT Duration Other (comment)   10   PT Treatment/Interventions ADLs/Self  Care Home Management;Moist Heat;Traction;Therapeutic exercise;Neuromuscular re-education;Balance training;Manual lymph drainage;Manual techniques;Wheelchair mobility training;Canalith Repostioning;Taping;Dry needling;Patient/family education;Functional mobility training;Scar mobilization;Orthotic Fit/Training;Compression bandaging;Therapeutic activities;Stair training    Consulted and Agree with Plan of Care Patient           Patient will benefit from skilled therapeutic intervention in order to improve the following deficits and impairments:  Pain,Decreased scar mobility,Decreased mobility,Decreased strength,Postural dysfunction,Improper body mechanics,Decreased coordination,Abnormal gait,Hypermobility,Difficulty walking,Decreased range of motion,Decreased endurance,Increased muscle spasms,Hypomobility,Impaired flexibility  Visit Diagnosis: Sacrococcygeal disorders, not elsewhere classified  Other abnormalities of gait and mobility  Myalgia     Problem List Patient Active Problem List   Diagnosis Date Noted  . Cough variant asthma vs UCAS 11/04/2015  . Leukopenia 11/02/2012  . Chest pain 11/20/2010  . Hypertension 08/14/2010    Jerl Mina ,PT, DPT, E-RYT  04/24/2020, 11:57 AM  [place] MAIN [facility] SERVICES [address] [place], [place], [address] Phone: [phone]   Fax:  [phone]  Name: Caitlin Walker MRN: [id number] Date of Birth: 04-17-1946

## 2020-04-24 NOTE — Patient Instructions (Signed)
Pre/post walking   Stretches: 5 reps Both sides   Knee to chest  Strap under L thigh  Exhale bring knee to chest '    Figure-4  Strap under L thigh  Exhale bring knee to chest '  Cross R thigh over thigh  -scoot hip over first then rock knees    Sidelying; quad stretch See video   __  Proper body mechanics with getting out of a chair to decrease strain  on back &pelvic floor   Avoid holding your breath when Getting out of the chair:  Scoot to front part of chair chair Heels behind feet, feet are hip width apart, nose over toes  Inhale like you are smelling roses Exhale to stand

## 2020-05-02 ENCOUNTER — Other Ambulatory Visit: Payer: Self-pay

## 2020-05-02 ENCOUNTER — Ambulatory Visit: Payer: PPO | Admitting: Physical Therapy

## 2020-05-02 DIAGNOSIS — R2689 Other abnormalities of gait and mobility: Secondary | ICD-10-CM

## 2020-05-02 DIAGNOSIS — M533 Sacrococcygeal disorders, not elsewhere classified: Secondary | ICD-10-CM | POA: Diagnosis not present

## 2020-05-02 DIAGNOSIS — M791 Myalgia, unspecified site: Secondary | ICD-10-CM

## 2020-05-02 NOTE — Therapy (Signed)
[place] [facility] MAIN [facility] SERVICES [address] [place], [place], [address] Phone: [phone]   Fax:  [phone]  Physical Therapy Treatment  Patient Details  Name: Caitlin Walker MRN: [id number] Date of Birth: Dec 12, 1945 Referring Provider (PT): Beverley Fiedler MD   Encounter Date: 05/02/2020   PT End of Session - 05/02/20 1244    Visit Number 3    Number of Visits 10    Date for PT Re-Evaluation 06/26/20    PT Start Time 1000    PT Stop Time 1100    PT Time Calculation (min) 60 min           Past Medical History:  Diagnosis Date   Chronic leukopenia DX  2011   MILD--  HEMATOLOGIST  --  DR Cyndie Chime   H/O hiatal hernia    TINY PER EGD 2007   Hematuria    Hemorrhoids    INTERNAL AND EXTERNAL    History of colon polyps    BENIGN   Hyperlipidemia    Hypertension    Ureterocele    LEFT   Wears contact lenses     Past Surgical History:  Procedure Laterality Date   APPENDECTOMY  1989   BREAST BIOPSY Left    benign   COLONOSCOPY N/A 08/01/2012   Procedure: COLONOSCOPY;  Surgeon: Petra Kuba, MD;  Location: [facility];  Service: Endoscopy;  Laterality: N/A;  pt. would like dan to do procedure   CYSTO/  URETHRAL DILATION /  RIGHT RETROGRADE PYELOGRAM/ RIGHT URETEROSCOPY/  INSTILLATION THERAPY  06-18-2008   CYSTOSCOPY/RETROGRADE/URETEROSCOPY Left 10/26/2013   Procedure: CYSTOSCOPY/RETROGRADE/DIAGNOSTIC DIGITAL URETEROSCOPY WITH LEFT URETERAL STENT PLACEMENT.;  Surgeon: Sebastian Ache, MD;  Location: [facility];  Service: Urology;  Laterality: Left;   DILATION AND CURETTAGE OF UTERUS  2001   SUPRACERVICAL ABDOMINAL HYSTERECTOMY  02-25-2000   W/   BILATERAL SALPINGOOPHORECTOMY AND RECTOCELE REPAIR    There were no vitals filed for this visit.   Subjective Assessment - 05/02/20 1006    Subjective Pt notices when sitting 15 min , her pain is at 5-6/10.    Pertinent History 3 vaginal deliveries  with one episiotomy, abdominal hysterectomy, appendectomy, Ureterocele, Hx of colon polyps, hemorrhoids. Bowel movements occur 2 x day . Pt has had to strain with BMS since she had to cut back on her greens . Daily water intake : 4 ( 16 fl oz) of water.  Pt walks 3 miles a day    Patient Stated Goals get rid of pain and be able to sit.              [facility] PT Assessment - 05/02/20 1007      Observation/Other Assessments   Observations slumped sitting    Scoliosis B iiac crest / shoulders levelled .      Palpation   Palpation comment flexed coccyx, limited nutation of sacrum   L SIJ hypomobile > R                     Pelvic Floor Special Questions - 05/02/20 1243    External Perineal Exam increased tightness at ischioanal fossa mm attachments B             [facility] Adult PT Treatment/Exercise - 05/02/20 1241      Neuro Re-ed    Neuro Re-ed Details  cued for pelvic tilt and pelvic floor stretches  , proper sitting posture      Modalities  Modalities Moist Heat      Moist Heat Therapy   Number Minutes Moist Heat 5 Minutes    Moist Heat Location --   sacrum/ perineum through sheets and pillow case     Manual Therapy   Manual therapy comments superior mob of sacrum into nutation, coccyx , STM/MWM at ischioanal fossa mm attachments  B                       PT Long Term Goals - 04/17/20 1043      PT LONG TERM GOAL #1   Title Pt will demo levelled shoulders/ pelvis across 2 visits in order to minimize pain and increase sitting    Time 2    Period Weeks    Status New    Target Date 05/01/20      PT LONG TERM GOAL #2   Title Pt will demo decreased L upper Q mm tensions, C/T, T/L hypomobility, abdominal scar restrictions in order to maintain reciprocal pattern between thorax and pelvis for her 3 miles walks    Time 6    Period Weeks    Status New    Target Date 05/29/20      PT LONG TERM GOAL #3   Title Pt will be ale to sit for 30 min without  pain in order to go out with friends for dinner    Time 6    Period Weeks    Status New    Target Date 05/29/20      PT LONG TERM GOAL #4   Title Pt will increase hip abduction strength from 3/5 B to > 4/5 B in order to minimize risk for worsening of pelvic dysfunctions and optimize pelvic girdle stability for walking    Time 4    Period Weeks    Status New    Target Date 05/15/20      PT LONG TERM GOAL #5   Title Pt will be IND with flexibilty routine to minimize mm tightness around hips and legs and promote nutation of sacrum    Time 10    Period Weeks    Status New    Target Date 06/26/20      Additional Long Term Goals   Additional Long Term Goals --      PT LONG TERM GOAL #6   Title Pt will demo more nutation of sacrum to promote longer sitting periods > 30 min    Time 6    Period Weeks    Status New    Target Date 05/29/20      PT LONG TERM GOAL #7   Title Pt will decrease her FOTO score for Pelvic Pain from 58 pts to < 48 pts in order to return to ADLs    Time 10    Period Weeks    Status New    Target Date 06/26/20                 Plan - 05/02/20 1246    Clinical Impression Statement Pt demo'd equal alignment of pelvic girdle across past visits and less spinal deviations. Progressed to coccyx and SIJ manual Tx to promote more nutation and mobility and less flexed coccyx. Pt tolerated Tx without complaints. Required cues for anterior tilt of pelvis and pelvic floor lengthening. Pt demo'd IND with sitting posture and able to self correct to not sacral sit and find anterior tilt of pelvis. Pt continues to benefit from skilled  PT. Plan to progress with deep core strengthening at next session and internal pelvic assessment at next session   Examination-Activity Limitations Sit    Stability/Clinical Decision Making Evolving/Moderate complexity    Rehab Potential Good    PT Frequency 1x / week    PT Duration Other (comment)   10   PT Treatment/Interventions  ADLs/Self Care Home Management;Moist Heat;Traction;Therapeutic exercise;Neuromuscular re-education;Balance training;Manual lymph drainage;Manual techniques;Wheelchair mobility training;Canalith Repostioning;Taping;Dry needling;Patient/family education;Functional mobility training;Scar mobilization;Orthotic Fit/Training;Compression bandaging;Therapeutic activities;Stair training    Consulted and Agree with Plan of Care Patient           Patient will benefit from skilled therapeutic intervention in order to improve the following deficits and impairments:  Pain,Decreased scar mobility,Decreased mobility,Decreased strength,Postural dysfunction,Improper body mechanics,Decreased coordination,Abnormal gait,Hypermobility,Difficulty walking,Decreased range of motion,Decreased endurance,Increased muscle spasms,Hypomobility,Impaired flexibility  Visit Diagnosis: Other abnormalities of gait and mobility  Sacrococcygeal disorders, not elsewhere classified  Myalgia     Problem List Patient Active Problem List   Diagnosis Date Noted   Cough variant asthma vs UCAS 11/04/2015   Leukopenia 11/02/2012   Chest pain 11/20/2010   Hypertension 08/14/2010    Jerl Mina ,PT, DPT, E-RYT  05/02/2020, 12:52 PM  [place] MAIN [facility] SERVICES [address] [place], [place], [address] Phone: [phone]   Fax:  [phone]  Name: CHAYCE JOSEPHS MRN: [id number] Date of Birth: 11/28/1945

## 2020-05-02 NOTE — Patient Instructions (Signed)
Stretch for pelvic floor   V- slides  "v heels slide away and then back toward buttocks and then rock knee to slight ,  slide heel along at 11 o clock away from buttocks   10 reps     On belly: Riding horse edge of mattress  knee bent like riding a horse, move knee towards armpit and out  10 reps     childs poses rocking with pillow under belly if need more support/ relaxation  childs poses rocking   Toes tucked, shoulders down and back, on forearms , hands shoulder width apart  10 reps   ___  Sitting with anterior tilt of pelvis and feet firm on the ground to offload tailbone

## 2020-05-08 ENCOUNTER — Other Ambulatory Visit: Payer: Self-pay

## 2020-05-08 ENCOUNTER — Ambulatory Visit: Payer: PPO | Attending: Family Medicine | Admitting: Physical Therapy

## 2020-05-08 DIAGNOSIS — M533 Sacrococcygeal disorders, not elsewhere classified: Secondary | ICD-10-CM | POA: Insufficient documentation

## 2020-05-08 DIAGNOSIS — M791 Myalgia, unspecified site: Secondary | ICD-10-CM | POA: Insufficient documentation

## 2020-05-08 DIAGNOSIS — R2689 Other abnormalities of gait and mobility: Secondary | ICD-10-CM | POA: Insufficient documentation

## 2020-05-08 NOTE — Therapy (Signed)
[place] [facility] MAIN [facility] SERVICES [address] [place], [place], [address] Phone: [phone]   Fax:  [phone] 12/ Physical Therapy Treatment / Progress Note reporting from 04/17/2020 to 05/08/2020 across 4 visits   Patient Details  Name: Caitlin Walker MRN: [id number] Date of Birth: 01-25-1946 Referring Provider (PT): Beverley Fiedler MD   Encounter Date: 05/08/2020   PT End of Session - 05/08/20 1136    Visit Number 4    Number of Visits 14    Date for PT Re-Evaluation 07/17/20   PN 05/08/20 visit 4   PT Start Time 1104    PT Stop Time 1200    PT Time Calculation (min) 56 min           Past Medical History:  Diagnosis Date  . Chronic leukopenia DX  2011   MILD--  HEMATOLOGIST  --  DR Cyndie Chime  . H/O hiatal hernia    TINY PER EGD 2007  . Hematuria   . Hemorrhoids    INTERNAL AND EXTERNAL   . History of colon polyps    BENIGN  . Hyperlipidemia   . Hypertension   . Ureterocele    LEFT  . Wears contact lenses     Past Surgical History:  Procedure Laterality Date  . APPENDECTOMY  1989  . BREAST BIOPSY Left    benign  . COLONOSCOPY N/A 08/01/2012   Procedure: COLONOSCOPY;  Surgeon: Petra Kuba, MD;  Location: [facility];  Service: Endoscopy;  Laterality: N/A;  pt. would like dan to do procedure  . CYSTO/  URETHRAL DILATION /  RIGHT RETROGRADE PYELOGRAM/ RIGHT URETEROSCOPY/  INSTILLATION THERAPY  06-18-2008  . CYSTOSCOPY/RETROGRADE/URETEROSCOPY Left 10/26/2013   Procedure: CYSTOSCOPY/RETROGRADE/DIAGNOSTIC DIGITAL URETEROSCOPY WITH LEFT URETERAL STENT PLACEMENT.;  Surgeon: Sebastian Ache, MD;  Location: [facility];  Service: Urology;  Laterality: Left;  . DILATION AND CURETTAGE OF UTERUS  2001  . SUPRACERVICAL ABDOMINAL HYSTERECTOMY  02-25-2000   W/   BILATERAL SALPINGOOPHORECTOMY AND RECTOCELE REPAIR    There were no vitals filed for this visit.   Subjective Assessment - 05/08/20 1124    Subjective Pt has  been practicing her exercise. Pt notices the burning sensation near the tailbone was less intense after last session    Pertinent History 3 vaginal deliveries with one episiotomy, abdominal hysterectomy, appendectomy, Ureterocele, Hx of colon polyps, hemorrhoids. Bowel movements occur 2 x day . Pt has had to strain with BMS since she had to cut back on her greens . Daily water intake : 4 ( 16 fl oz) of water.  Pt walks 3 miles a day    Patient Stated Goals get rid of pain and be able to sit.              [facility] PT Assessment - 05/08/20 1136      Coordination   Gross Motor Movements are Fluid and Coordinated --   diaphragm/ pelvic floor excursion noted   Fine Motor Movements are Fluid and Coordinated --   poor diassociation of trunk/ BLE     Other:   Other/ Comments sitting on soft chair while receiving education on today's TX, IAP system, anatomy. no pain until after 23 minutes      Palpation   Palpation comment coccyx not flexed, SIJ and spine with mobility                         [facility] Adult PT Treatment/Exercise -  05/08/20 1138      Therapeutic Activites    Other Therapeutic Activities education on anatomy / physiology with deep core system, progression of Tx , anatomy images shown to pt on pudendal nn, discussed modification to car seat to help prepare long drives to visit brother 2 hours away      Neuro Re-ed    Neuro Re-ed Details  cued for deep core  level 1 and 2 and multifidis twist in standing position, cued for diassociation between trunk and BLE                      PT Long Term Goals - 05/08/20 1155      PT LONG TERM GOAL #1   Title Pt will demo levelled shoulders/ pelvis across 2 visits in order to minimize pain and increase sitting    Time 2    Period Weeks    Status Achieved      PT LONG TERM GOAL #2   Title Pt will demo decreased L upper Q mm tensions, C/T, T/L hypomobility, abdominal scar restrictions in order to maintain reciprocal  pattern between thorax and pelvis for her 3 miles walks    Time 6    Period Weeks    Status Achieved      PT LONG TERM GOAL #3   Title Pt will be ale to sit for 30 min without pain in order to go out with friends for dinner  ( 05/08/20: 23 min Visit 4 )    Time 6    Period Weeks    Status On-going      PT LONG TERM GOAL #4   Title Pt will increase hip abduction strength from 3/5 B to > 4/5 B in order to minimize risk for worsening of pelvic dysfunctions and optimize pelvic girdle stability for walking    Time 4    Period Weeks    Status On-going      PT LONG TERM GOAL #5   Title Pt will be IND with flexibilty routine to minimize mm tightness around hips and legs and promote nutation of sacrum    Time 10    Period Weeks    Status On-going      Additional Long Term Goals   Additional Long Term Goals --      PT LONG TERM GOAL #6   Title Pt will demo more nutation of sacrum to promote longer sitting periods > 30 min    Time 6    Period Weeks    Status Achieved      PT LONG TERM GOAL #7   Title Pt will decrease her FOTO score for Pelvic Pain from 58 pts to < 48 pts in order to return to ADLs  ( 05/08/20: 42 pts)    Time 10    Period Weeks    Status Achieved      PT LONG TERM GOAL #8   Title Pt will be able to drive for 2 hours with car seat modifications/ rest stop with manageable buttock pain (<2/10)  to visit brother out of town    Time 8    Period Weeks    Status New    Target Date 07/03/20      PT LONG TERM GOAL  #9   TITLE Pt will demo proper pelvic floor coordination/ quick contraction of 5 reps, 3 sec with no lowered position of pelvic organs and no bearing down onto pelvic floor/ organs  and proper sequential and circumferential contraction of all 3 mm layers of pelvic floor  in order to minimize worsening of prolapse and relapse of Sx    Time 10    Period Weeks    Status New    Target Date 07/17/20                  Plan - 05/08/20 1156    Clinical  Impression Statement Pt has achieved 4/9 goals and progressing well towards remaining goals. Pt's FOTO score for pain decreased from 52 pts to 46 pts which indicate improved function.  Pt made a milestone improvement today with ability to sit for 23 mins on soft chair without pain. Across the past 4 visits, pt has made improvements with better alignment of pelvic girdle and spine which now no longer shows deviations/ asymmetries. Pt's coccyx is no longer flexed and her SIJ has more mobility which has contributed to her sitting tolerance. Pt also has demo'd improved body mechanics with proper sitting posture and is IND with a flexibility routine to maintain flexible spine/ SIJ/ hips/ BLE after her regular walking routine.  Today, advanced pt to deep core strengthening which will continue to build her sitting tolerance. At next session, plan to address pelvic floor given pt's Hx of prolapse and hysterectomy. Pt continues to benefit from skilled PT.       Examination-Activity Limitations Sit    Stability/Clinical Decision Making Evolving/Moderate complexity    Rehab Potential Good    PT Frequency 1x / week    PT Duration Other (comment)   10   PT Treatment/Interventions ADLs/Self Care Home Management;Moist Heat;Traction;Therapeutic exercise;Neuromuscular re-education;Balance training;Manual lymph drainage;Manual techniques;Wheelchair mobility training;Canalith Repostioning;Taping;Dry needling;Patient/family education;Functional mobility training;Scar mobilization;Orthotic Fit/Training;Compression bandaging;Therapeutic activities;Stair training    Consulted and Agree with Plan of Care Patient           Patient will benefit from skilled therapeutic intervention in order to improve the following deficits and impairments:  Pain,Decreased scar mobility,Decreased mobility,Decreased strength,Postural dysfunction,Improper body mechanics,Decreased coordination,Abnormal gait,Hypermobility,Difficulty  walking,Decreased range of motion,Decreased endurance,Increased muscle spasms,Hypomobility,Impaired flexibility  Visit Diagnosis: Sacrococcygeal disorders, not elsewhere classified - Plan: PT plan of care cert/re-cert  Other abnormalities of gait and mobility - Plan: PT plan of care cert/re-cert  Myalgia - Plan: PT plan of care cert/re-cert     Problem List Patient Active Problem List   Diagnosis Date Noted  . Cough variant asthma vs UCAS 11/04/2015  . Leukopenia 11/02/2012  . Chest pain 11/20/2010  . Hypertension 08/14/2010    Jerl Mina ,PT, DPT, E-RYT  05/08/2020, 12:47 PM  [place] MAIN [facility] SERVICES [address] [place], [place], [address] Phone: [phone]   Fax:  [phone]  Name: Caitlin Walker MRN: [id number] Date of Birth: December 14, 1945

## 2020-05-08 NOTE — Patient Instructions (Signed)
Deep core level 1 and 2 See sheet    Multifidis twist  Band is on doorknob: stand further away from door (facing perpendicular)   Twisting trunk without moving the hips and knees Hold band at the level of ribcage, elbows bent,shoulder blades roll back and down like squeezing a pencil under armpit    Exhale twist,.10-15 deg away from door without moving your hips/ knees. Continue to maintain equal weight through legs. Keep knee unlocked.  10 x 2

## 2020-05-13 DIAGNOSIS — J45909 Unspecified asthma, uncomplicated: Secondary | ICD-10-CM | POA: Diagnosis not present

## 2020-05-13 DIAGNOSIS — M818 Other osteoporosis without current pathological fracture: Secondary | ICD-10-CM | POA: Diagnosis not present

## 2020-05-13 DIAGNOSIS — K219 Gastro-esophageal reflux disease without esophagitis: Secondary | ICD-10-CM | POA: Diagnosis not present

## 2020-05-13 DIAGNOSIS — E7800 Pure hypercholesterolemia, unspecified: Secondary | ICD-10-CM | POA: Diagnosis not present

## 2020-05-13 DIAGNOSIS — G4700 Insomnia, unspecified: Secondary | ICD-10-CM | POA: Diagnosis not present

## 2020-05-13 DIAGNOSIS — I10 Essential (primary) hypertension: Secondary | ICD-10-CM | POA: Diagnosis not present

## 2020-05-15 ENCOUNTER — Other Ambulatory Visit: Payer: Self-pay

## 2020-05-15 ENCOUNTER — Ambulatory Visit: Payer: PPO | Admitting: Physical Therapy

## 2020-05-15 DIAGNOSIS — M533 Sacrococcygeal disorders, not elsewhere classified: Secondary | ICD-10-CM | POA: Diagnosis not present

## 2020-05-15 DIAGNOSIS — R2689 Other abnormalities of gait and mobility: Secondary | ICD-10-CM

## 2020-05-15 DIAGNOSIS — M791 Myalgia, unspecified site: Secondary | ICD-10-CM

## 2020-05-15 NOTE — Patient Instructions (Signed)
Stretches : (Cuing provided for proper alignment)  Instructions start with Strap on R    Stretches for your legs: LAYING on Back Use upper arms and elbows for stability when pulling strap Opposite knee bent and foot firm in align with hip   Strap on ballmound:  Hip socket  _strap, L knee bent, R ballmound against strap and spread toes, rolling foot 15 deg out and in across midline.  10 reps each side   Hamstring _knee bends  10 reps  With knee pointing straight ( slightly to outside to minimize snapping sensation)      10 reps with knee pointing out towards armpit ( notice the stretch in the medial hamstring muscle)   Hip abductors ( figure 4)      Strap under R thigh, L ankle over R thigh ( stretching L glut)     5  Breaths    Adductors and pelvic floor ( Happy Baby) : Delane Ginger are wide towards armpits, sole of feet towards ceiling   IT band _scoot hips to R, cross R leg over L and straighten knee with strap on ballmound,    Quad in sidelying _strap around the ankle, pulling ankle towards buttocks  Bottom leg firm and stabilization with knee bent    --   Mermaid stretch   ( pelvic floor)  Rocking while seated on the floor with heels to one side of the hip Heels to one side of the hip  Rock forward towards the knee that is bent , rock beck towards the opposite sitting bones    ___

## 2020-05-15 NOTE — Therapy (Signed)
[place] MAIN [facility] SERVICES [address] [place], [place], [address] Phone: [phone]   Fax:  [phone]  Physical Therapy Treatment  Patient Details  Name: Caitlin Walker MRN: [id number] Date of Birth: [birth year] Referring Provider (PT): Milagros Evener MD   Encounter Date: 05/15/2020   PT End of Session - 05/15/20 1116    Visit Number 5    Number of Visits 14    Date for PT Re-Evaluation 07/17/20   PN 05/08/20 visit 4   PT Start Time 1102    PT Stop Time 1200    PT Time Calculation (min) 58 min           Past Medical History:  Diagnosis Date  . Chronic leukopenia DX  2011   MILD--  HEMATOLOGIST  --  DR Beryle Beams  . H/O hiatal hernia    TINY PER EGD 2007  . Hematuria   . Hemorrhoids    INTERNAL AND EXTERNAL   . History of colon polyps    BENIGN  . Hyperlipidemia   . Hypertension   . Ureterocele    LEFT  . Wears contact lenses     Past Surgical History:  Procedure Laterality Date  . APPENDECTOMY  1989  . BREAST BIOPSY Left    benign  . COLONOSCOPY N/A 08/01/2012   Procedure: COLONOSCOPY;  Surgeon: Jeryl Columbia, MD;  Location: [facility];  Service: Endoscopy;  Laterality: N/A;  pt. would like dan to do procedure  . CYSTO/  URETHRAL DILATION /  RIGHT RETROGRADE PYELOGRAM/ RIGHT URETEROSCOPY/  INSTILLATION THERAPY  06-18-2008  . CYSTOSCOPY/RETROGRADE/URETEROSCOPY Left 10/26/2013   Procedure: CYSTOSCOPY/RETROGRADE/DIAGNOSTIC DIGITAL URETEROSCOPY WITH LEFT URETERAL STENT PLACEMENT.;  Surgeon: Alexis Frock, MD;  Location: [facility];  Service: Urology;  Laterality: Left;  . DILATION AND CURETTAGE OF UTERUS  2001  . SUPRACERVICAL ABDOMINAL HYSTERECTOMY  02-25-2000   W/   BILATERAL SALPINGOOPHORECTOMY AND RECTOCELE REPAIR    There were no vitals filed for this visit.   Subjective Assessment - 05/15/20 1104    Subjective Pt was able to sit for 30 min at home since last session. Pt tried to do her  deep core exercises on the floor but it hurt her back to lie down flat. Pt did them in teh bed.    Pertinent History 3 vaginal deliveries with one episiotomy, abdominal hysterectomy, appendectomy, Ureterocele, Hx of colon polyps, hemorrhoids. Bowel movements occur 2 x day . Pt has had to strain with BMS since she had to cut back on her greens . Daily water intake : 4 ( 16 fl oz) of water.  Pt walks 3 miles a day    Patient Stated Goals get rid of pain and be able to sit.              [facility] PT Assessment - 05/15/20 1107      Palpation   Spinal mobility WFL all directions    Palpation comment levelled pelvic girdle, increased L paraspinal mm bulk                      Pelvic Floor Special Questions - 05/15/20 1105    External Perineal Exam without undergarments    External Palpation tightness at R ischial tuberosity/ rami, sacrococcygeal ligament/ coccygeus ligament,  L anterior mm ischiocavernosus , hamstrings on R tighter > L    Pelvic Floor Internal Exam --    Exam Type --  [place] [facility] PT Treatment/Exercise - 05/15/20 1146      Neuro Re-ed    Neuro Re-ed Details  cued for use of strap for hip/ pelvic floor stretches      Exercises   Exercises --   see pt instructions     Moist Heat Therapy   Number Minutes Moist Heat 5 Minutes    Moist Heat Location --   perineum through clothing     Manual Therapy   Manual therapy comments STM/MWM to address   R ischial tuberosity/ rami, sacrococcygeal ligament/ coccygeus ligament  / L anterior mm                       PT Long Term Goals - 05/15/20 1114      PT LONG TERM GOAL #1   Title Pt will demo levelled shoulders/ pelvis across 2 visits in order to minimize pain and increase sitting    Time 2    Period Weeks    Status Achieved      PT LONG TERM GOAL #2   Title Pt will demo decreased L upper Q mm tensions, C/T, T/L hypomobility, abdominal scar restrictions in order to maintain reciprocal  pattern between thorax and pelvis for her 3 miles walks    Time 6    Period Weeks    Status Achieved      PT LONG TERM GOAL #3   Title Pt will be ale to sit for 30 min without pain in order to go out with friends for dinner  ( 05/08/20: 23 min Visit 4 , 1/ 12: 30 min)  )    Time 6    Period Weeks    Status Achieved      PT LONG TERM GOAL #4   Title Pt will increase hip abduction strength from 3/5 B to > 4/5 B in order to minimize risk for worsening of pelvic dysfunctions and optimize pelvic girdle stability for walking    Time 4    Period Weeks    Status On-going      PT LONG TERM GOAL #5   Title Pt will be IND with flexibilty routine to minimize mm tightness around hips and legs and promote nutation of sacrum    Time 10    Period Weeks    Status On-going      Additional Long Term Goals   Additional Long Term Goals Yes      PT LONG TERM GOAL #6   Title Pt will demo more nutation of sacrum to promote longer sitting periods > 30 min    Time 6    Period Weeks    Status Achieved      PT LONG TERM GOAL #7   Title Pt will decrease her FOTO score for Pelvic Pain from 58 pts to < 48 pts in order to return to ADLs  ( 05/08/20: 42 pts)    Time 10    Period Weeks    Status Achieved      PT LONG TERM GOAL #8   Title Pt will be able to drive for 2 hours with car seat modifications/ rest stop with manageable buttock pain (<2/10)  to visit brother out of town    Time 8    Period Weeks    Status New      PT LONG TERM GOAL  #9   TITLE Pt will demo proper pelvic floor coordination/ quick contraction of 5 reps, 3 sec  with no lowered position of pelvic organs and no bearing down onto pelvic floor/ organs and proper sequential and circumferential contraction of all 3 mm layers of pelvic floor  in order to minimize worsening of prolapse and relapse of Sx    Time 10    Period Weeks    Status New      PT LONG TERM GOAL  #10   TITLE Pt will demo proper technique for stand to floor and report  being able to lue on the floor doing deep core 1-2 HEP without LBP    Time 6    Period Weeks    Status New    Target Date 06/26/20                 Plan - 05/15/20 1351    Clinical Impression Statement Pt demo'd equal pelvic girdle and shoulder and spine across past visits. Focused on minmizing perienal scar restrictions and deviations at coccyx and tightness of anterior pelvic floor mm with external manual Tx which pt tolerated without complaints. Pt also showed increased tightness of hamstrings R tighter > L and was provided manual Tx and f/u stretches for global mm of BLE. Pt demo'd IND with HEP after being provided cues.   Progress to deep core strengthening and furher pelvic floor assessment at next session. Pt continues to benefit from skilled PT   Examination-Activity Limitations Sit    Stability/Clinical Decision Making Evolving/Moderate complexity    Rehab Potential Good    PT Frequency 1x / week    PT Duration Other (comment)   10   PT Treatment/Interventions ADLs/Self Care Home Management;Moist Heat;Traction;Therapeutic exercise;Neuromuscular re-education;Balance training;Manual lymph drainage;Manual techniques;Wheelchair mobility training;Canalith Repostioning;Taping;Dry needling;Patient/family education;Functional mobility training;Scar mobilization;Orthotic Fit/Training;Compression bandaging;Therapeutic activities;Stair training    Consulted and Agree with Plan of Care Patient           Patient will benefit from skilled therapeutic intervention in order to improve the following deficits and impairments:  Pain,Decreased scar mobility,Decreased mobility,Decreased strength,Postural dysfunction,Improper body mechanics,Decreased coordination,Abnormal gait,Hypermobility,Difficulty walking,Decreased range of motion,Decreased endurance,Increased muscle spasms,Hypomobility,Impaired flexibility  Visit Diagnosis: Sacrococcygeal disorders, not elsewhere classified  Other  abnormalities of gait and mobility  Myalgia     Problem List Patient Active Problem List   Diagnosis Date Noted  . Cough variant asthma vs UCAS 11/04/2015  . Leukopenia 11/02/2012  . Chest pain 11/20/2010  . Hypertension 08/14/2010    Jerl Mina ,PT, DPT, E-RYT  05/15/2020, 1:51 PM  [place] MAIN [facility] SERVICES [address] [place], [place], [address] Phone: [phone]   Fax:  [phone]  Name: ZAHIA MCADORY MRN: [id number] Date of Birth: [birth year]

## 2020-05-22 ENCOUNTER — Other Ambulatory Visit: Payer: Self-pay

## 2020-05-22 ENCOUNTER — Ambulatory Visit: Payer: PPO | Admitting: Physical Therapy

## 2020-05-22 DIAGNOSIS — M791 Myalgia, unspecified site: Secondary | ICD-10-CM

## 2020-05-22 DIAGNOSIS — M533 Sacrococcygeal disorders, not elsewhere classified: Secondary | ICD-10-CM | POA: Diagnosis not present

## 2020-05-22 DIAGNOSIS — R2689 Other abnormalities of gait and mobility: Secondary | ICD-10-CM

## 2020-05-22 NOTE — Patient Instructions (Signed)
Feet care :  Self -feet massage   Handshake : fingers between toes, moving ballmounds/toes back and forth several times while other hand anchors at arch. Do the same at the hind/mid foot.  Heel to toes upward to a letter Big Letter T strokes to spread ballmounds and toes, several times, pinch between webs of toes  Run finger tips along top of foot between long bones "comb between the bones"    Wiggle toes and spread them out when relaxing   __  Backward lunges with band at doorknob  2 mins . Elbows by ribs, shoulders down and back  Front knee in place above ankle, back foot and toes pointed forward, ( do not turn the hips and toes out) . Heel is up to be able to push off and return R foot next to the L at hip width apart  Carry your center with you as you step to maintain 50% weight in both legs.      WALKING WITH RESISTANCE BLUE Band at waist connected to doorknob 13mins Stepping forward normal length steps, planting mid and forefoot down, center of mass ( navel) leans forward slightly as if you were walking uphill 3-4 steps till band feels taut ( MAKE SURE THE DOOR IS LOCKED AND WON'T OPEN)   Stepping backwards, lower heel slowly, carry trunk and hips back , leaning forward, front knee along 2-3 rd toe line    2 min  ____  NO KNOCKING KNEES , SPREAD TOES, DO NOT CLAW WITH TOE TIPS, Release into ballmounds

## 2020-05-22 NOTE — Therapy (Addendum)
[place] MAIN [facility] SERVICES [address] [place], [place], [address] Phone: [phone]   Fax:  [phone]  Physical Therapy Treatment  Patient Details  Name: Caitlin Walker MRN: [id number] Date of Birth: 1946/03/07 Referring Provider (PT): Milagros Evener MD   Encounter Date: 05/22/2020   PT End of Session - 05/22/20 1204    Visit Number 6    Number of Visits 14    Date for PT Re-Evaluation 07/17/20   PN 05/08/20 visit 4   PT Start Time 3832    PT Stop Time 9191    PT Time Calculation (min) 61 min           Past Medical History:  Diagnosis Date  . Chronic leukopenia DX  2011   MILD--  HEMATOLOGIST  --  DR Beryle Beams  . H/O hiatal hernia    TINY PER EGD 2007  . Hematuria   . Hemorrhoids    INTERNAL AND EXTERNAL   . History of colon polyps    BENIGN  . Hyperlipidemia   . Hypertension   . Ureterocele    LEFT  . Wears contact lenses     Past Surgical History:  Procedure Laterality Date  . APPENDECTOMY  1989  . BREAST BIOPSY Left    benign  . COLONOSCOPY N/A 08/01/2012   Procedure: COLONOSCOPY;  Surgeon: Jeryl Columbia, MD;  Location: [facility];  Service: Endoscopy;  Laterality: N/A;  pt. would like dan to do procedure  . CYSTO/  URETHRAL DILATION /  RIGHT RETROGRADE PYELOGRAM/ RIGHT URETEROSCOPY/  INSTILLATION THERAPY  06-18-2008  . CYSTOSCOPY/RETROGRADE/URETEROSCOPY Left 10/26/2013   Procedure: CYSTOSCOPY/RETROGRADE/DIAGNOSTIC DIGITAL URETEROSCOPY WITH LEFT URETERAL STENT PLACEMENT.;  Surgeon: Alexis Frock, MD;  Location: [facility];  Service: Urology;  Laterality: Left;  . DILATION AND CURETTAGE OF UTERUS  2001  . SUPRACERVICAL ABDOMINAL HYSTERECTOMY  02-25-2000   W/   BILATERAL SALPINGOOPHORECTOMY AND RECTOCELE REPAIR    There were no vitals filed for this visit.   Subjective Assessment - 05/22/20 1107    Subjective Pt reports she has been able to sit for 30 min last week several times. Pt has  been walking in her house and doing her stretches    Pertinent History 3 vaginal deliveries with one episiotomy, abdominal hysterectomy, appendectomy, Ureterocele, Hx of colon polyps, hemorrhoids. Bowel movements occur 2 x day . Pt has had to strain with BMS since she had to cut back on her greens . Daily water intake : 4 ( 16 fl oz) of water.  Pt walks 3 miles a day    Patient Stated Goals get rid of pain and be able to sit.              [facility] PT Assessment - 05/22/20 1128      Observation/Other Assessments   Observations claw toes B feet, limited toe abduction      Strength   Overall Strength Comments DF/EV R 3-/5, L 4/5 ( post Tx: DF/EV 4/5 B)      Palpation   SI assessment  limited hip ext R 0 deg, post Tx: hip ext 20 deg, SIJ hypmobile R > L    Palpation comment tightness at adductor/ hamstring semitendimembranosus R      Ambulation/Gait   Gait Comments short stride, hyperextension of knees in stance, minimal transverse arch in swing, poor toe off, supination  [place] Adult PT Treatment/Exercise - 05/22/20 1152      Neuro Re-ed    Neuro Re-ed Details  cued for DF/EV HEP and self feet massage , gait mechanics      Manual Therapy   Manual therapy comments long axis distraction BLE, STM/MWM at adductor/ hamstring R,  PA mob at SIJ to promote nutation, STM/MWM at feet to promote toe abduction                      PT Long Term Goals - 05/15/20 1114      PT LONG TERM GOAL #1   Title Pt will demo levelled shoulders/ pelvis across 2 visits in order to minimize pain and increase sitting    Time 2    Period Weeks    Status Achieved      PT LONG TERM GOAL #2   Title Pt will demo decreased L upper Q mm tensions, C/T, T/L hypomobility, abdominal scar restrictions in order to maintain reciprocal pattern between thorax and pelvis for her 3 miles walks    Time 6    Period Weeks    Status Achieved      PT LONG TERM GOAL #3   Title  Pt will be ale to sit for 30 min without pain in order to go out with friends for dinner  ( 05/08/20: 23 min Visit 4 , 1/ 12: 30 min)  )    Time 6    Period Weeks    Status Achieved      PT LONG TERM GOAL #4   Title Pt will increase hip abduction strength from 3/5 B to > 4/5 B in order to minimize risk for worsening of pelvic dysfunctions and optimize pelvic girdle stability for walking    Time 4    Period Weeks    Status On-going      PT LONG TERM GOAL #5   Title Pt will be IND with flexibilty routine to minimize mm tightness around hips and legs and promote nutation of sacrum    Time 10    Period Weeks    Status On-going      Additional Long Term Goals   Additional Long Term Goals Yes      PT LONG TERM GOAL #6   Title Pt will demo more nutation of sacrum to promote longer sitting periods > 30 min    Time 6    Period Weeks    Status Achieved      PT LONG TERM GOAL #7   Title Pt will decrease her FOTO score for Pelvic Pain from 58 pts to < 48 pts in order to return to ADLs  ( 05/08/20: 42 pts)    Time 10    Period Weeks    Status Achieved      PT LONG TERM GOAL #8   Title Pt will be able to drive for 2 hours with car seat modifications/ rest stop with manageable buttock pain (<2/10)  to visit brother out of town    Time 8    Period Weeks    Status New      PT LONG TERM GOAL  #9   TITLE Pt will demo proper pelvic floor coordination/ quick contraction of 5 reps, 3 sec with no lowered position of pelvic organs and no bearing down onto pelvic floor/ organs and proper sequential and circumferential contraction of all 3 mm layers of pelvic floor  in order to minimize worsening  of prolapse and relapse of Sx    Time 10    Period Weeks    Status New      PT LONG TERM GOAL  #10   TITLE Pt will demo proper technique for stand to floor and report being able to lue on the floor doing deep core 1-2 HEP without LBP    Time 6    Period Weeks    Status New    Target Date 06/26/20                  Plan - 05/22/20 1204    Clinical Impression Statement Pt continues to be able to sit for 30 min without pain.   Pt demo'd R SIJ hypomobility, limited hip extension, adductor, hamstring mobility which improved post Tx. Assessed lower kinetic chain today. Pt showed increased limited with toe abduction and claw toes, short strides, supination, limited push off, and hyperextensions of knees in stance phase. Pt showed improved toe abduction and less knee hyperextension post training and manual Tx. Anticipate with increased hip extension on R and improved toe extension/ abduction, pt will improve gait mechanics for long-term fitness routine with 3 mile walking per day.  Plan to continue with lower kinetic chain treatments to promote improve SIJ mechanics. Pertinent Hx: Pt has had a Hx of R foot Fx midfoot in 2010. Pt continues to benefit from skilled PT.    Examination-Activity Limitations Sit    Stability/Clinical Decision Making Evolving/Moderate complexity    Rehab Potential Good    PT Frequency 1x / week    PT Duration Other (comment)   10   PT Treatment/Interventions ADLs/Self Care Home Management;Moist Heat;Traction;Therapeutic exercise;Neuromuscular re-education;Balance training;Manual lymph drainage;Manual techniques;Wheelchair mobility training;Canalith Repostioning;Taping;Dry needling;Patient/family education;Functional mobility training;Scar mobilization;Orthotic Fit/Training;Compression bandaging;Therapeutic activities;Stair training    Consulted and Agree with Plan of Care Patient           Patient will benefit from skilled therapeutic intervention in order to improve the following deficits and impairments:  Pain,Decreased scar mobility,Decreased mobility,Decreased strength,Postural dysfunction,Improper body mechanics,Decreased coordination,Abnormal gait,Hypermobility,Difficulty walking,Decreased range of motion,Decreased endurance,Increased muscle  spasms,Hypomobility,Impaired flexibility  Visit Diagnosis: Other abnormalities of gait and mobility  Sacrococcygeal disorders, not elsewhere classified  Myalgia     Problem List Patient Active Problem List   Diagnosis Date Noted  . Cough variant asthma vs UCAS 11/04/2015  . Leukopenia 11/02/2012  . Chest pain 11/20/2010  . Hypertension 08/14/2010    Jerl Mina ,PT, DPT, E-RYT  05/22/2020, 12:05 PM  [place] MAIN [facility] SERVICES [address] [place], [place], [address] Phone: [phone]   Fax:  [phone]  Name: Caitlin Walker MRN: [id number] Date of Birth: 10/10/1945

## 2020-05-29 ENCOUNTER — Other Ambulatory Visit: Payer: Self-pay

## 2020-05-29 ENCOUNTER — Ambulatory Visit: Payer: PPO | Admitting: Physical Therapy

## 2020-05-29 DIAGNOSIS — M791 Myalgia, unspecified site: Secondary | ICD-10-CM

## 2020-05-29 DIAGNOSIS — R2689 Other abnormalities of gait and mobility: Secondary | ICD-10-CM

## 2020-05-29 DIAGNOSIS — M533 Sacrococcygeal disorders, not elsewhere classified: Secondary | ICD-10-CM | POA: Diagnosis not present

## 2020-05-29 NOTE — Therapy (Signed)
[place] MAIN [facility] SERVICES [address] [place], [place], [address] Phone: [phone]   Fax:  [phone]  Physical Therapy Treatment  Patient Details  Name: Caitlin Walker MRN: [id number] Date of Birth: Nov 04, 1945 Referring Provider (PT): Milagros Evener MD   Encounter Date: 05/29/2020   PT End of Session - 05/29/20 1107    Visit Number 7    Number of Visits 14    Date for PT Re-Evaluation 07/17/20   PN 05/08/20 visit 4   PT Start Time 1104    PT Stop Time 1200    PT Time Calculation (min) 56 min           Past Medical History:  Diagnosis Date  . Chronic leukopenia DX  2011   MILD--  HEMATOLOGIST  --  DR Beryle Beams  . H/O hiatal hernia    TINY PER EGD 2007  . Hematuria   . Hemorrhoids    INTERNAL AND EXTERNAL   . History of colon polyps    BENIGN  . Hyperlipidemia   . Hypertension   . Ureterocele    LEFT  . Wears contact lenses     Past Surgical History:  Procedure Laterality Date  . APPENDECTOMY  1989  . BREAST BIOPSY Left    benign  . COLONOSCOPY N/A 08/01/2012   Procedure: COLONOSCOPY;  Surgeon: Jeryl Columbia, MD;  Location: [facility];  Service: Endoscopy;  Laterality: N/A;  pt. would like dan to do procedure  . CYSTO/  URETHRAL DILATION /  RIGHT RETROGRADE PYELOGRAM/ RIGHT URETEROSCOPY/  INSTILLATION THERAPY  06-18-2008  . CYSTOSCOPY/RETROGRADE/URETEROSCOPY Left 10/26/2013   Procedure: CYSTOSCOPY/RETROGRADE/DIAGNOSTIC DIGITAL URETEROSCOPY WITH LEFT URETERAL STENT PLACEMENT.;  Surgeon: Alexis Frock, MD;  Location: [facility];  Service: Urology;  Laterality: Left;  . DILATION AND CURETTAGE OF UTERUS  2001  . SUPRACERVICAL ABDOMINAL HYSTERECTOMY  02-25-2000   W/   BILATERAL SALPINGOOPHORECTOMY AND RECTOCELE REPAIR    There were no vitals filed for this visit.   Subjective Assessment - 05/29/20 1106    Subjective Pt reports she has been able to sit for 30 min for the past 3 weeks. After 30  mins, pt feels sharp and then numbness. Numbness lasts until she gets up and walk around.    Pertinent History 3 vaginal deliveries with one episiotomy, abdominal hysterectomy, appendectomy, Ureterocele, Hx of colon polyps, hemorrhoids. Bowel movements occur 2 x day . Pt has had to strain with BMS since she had to cut back on her greens . Daily water intake : 4 ( 16 fl oz) of water.  Pt walks 3 miles a day    Patient Stated Goals get rid of pain and be able to sit.              [facility] PT Assessment - 05/29/20 1139      Coordination   Gross Motor Movements are Fluid and Coordinated --   pelvic tilt without difficulty                     Pelvic Floor Special Questions - 05/29/20 1138    Pelvic Floor Internal Exam pt consented verbally and has no contraindications    Exam Type Vaginal    Palpation tightness at 5-7 o'clock 1-3 layers, lowered bladder within introitus    Strength weak squeeze, no lift             [facility] Adult PT Treatment/Exercise - 05/29/20 1200  Neuro Re-ed    Neuro Re-ed Details  provided cues for less ab straining, and longer exhalations for more upward movement of pelvic floor,      Modalities   Modalities Moist Heat      Moist Heat Therapy   Number Minutes Moist Heat 5 Minutes    Moist Heat Location --   during instructions     Manual Therapy   Internal Pelvic Floor STM/MWM to release perineal scars 5-7 o clock, and facilitation of anterior mm to promote upward position of bladder                       PT Long Term Goals - 05/29/20 1107      PT LONG TERM GOAL #1   Title Pt will demo levelled shoulders/ pelvis across 2 visits in order to minimize pain and increase sitting    Time 2    Period Weeks    Status Achieved      PT LONG TERM GOAL #2   Title Pt will demo decreased L upper Q mm tensions, C/T, T/L hypomobility, abdominal scar restrictions in order to maintain reciprocal pattern between thorax and pelvis for her 3  miles walks    Time 6    Period Weeks    Status Achieved      PT LONG TERM GOAL #3   Title Pt will be ale to sit for 30 min without pain in order to go out with friends for dinner  ( 05/08/20: 23 min Visit 4 , 1/ 12: 30 min)  )    Time 6    Period Weeks    Status Achieved      PT LONG TERM GOAL #4   Title Pt will increase hip abduction strength from 3/5 B to > 4/5 B in order to minimize risk for worsening of pelvic dysfunctions and optimize pelvic girdle stability for walking    Time 4    Period Weeks    Status On-going      PT LONG TERM GOAL #5   Title Pt will be IND with flexibilty routine to minimize mm tightness around hips and legs and promote nutation of sacrum    Time 10    Period Weeks    Status On-going      PT LONG TERM GOAL #6   Title Pt will demo more nutation of sacrum to promote longer sitting periods > 30 min    Time 6    Period Weeks    Status Achieved      PT LONG TERM GOAL #7   Title Pt will decrease her FOTO score for Pelvic Pain from 58 pts to < 48 pts in order to return to ADLs  ( 05/08/20: 42 pts)    Time 10    Period Weeks    Status Achieved      PT LONG TERM GOAL #8   Title Pt will be able to drive for 2 hours with car seat modifications/ rest stop with manageable buttock pain (<2/10)  to visit brother out of town    Time 8    Period Weeks    Status New      PT LONG TERM GOAL  #9   TITLE Pt will demo proper pelvic floor coordination/ quick contraction of 5 reps, 3 sec with no lowered position of pelvic organs and no bearing down onto pelvic floor/ organs and proper sequential and circumferential contraction of all 3 mm  layers of pelvic floor  in order to minimize worsening of prolapse and relapse of Sx    Time 10    Period Weeks    Status New      PT LONG TERM GOAL  #10   TITLE Pt will demo proper technique for stand to floor and report being able to lue on the floor doing deep core 1-2 HEP without LBP    Time 6    Period Weeks    Status New                  Plan - 05/29/20 1110    Clinical Impression Statement Pt is progressing well with being able to sit for 30 min the past 3 weeks.  Addressing her numbness that she experiences after 30 min by assessing perineal scars which were present 2/2 3 epiostomies from her children. Pt demo'd decreased scar restrictions and reported no pain during Tx. Pt required cues for proper lengthening and upward movement of pelvic floor instead of straining. Pt demo'd lowered bladder position which lead to the need to elevate hips on pillow to help pt elicit a better upward movement of pelvic floor given the anti gravity position this pillow provides. Plan to continue with internal pelvic floor Tx to minimize scar restrictions which will help with optimal lengthening of pelvic floor and promote less numbness when sitting. Pt continues to benefit from skilled PT   Examination-Activity Limitations Sit    Stability/Clinical Decision Making Evolving/Moderate complexity    Rehab Potential Good    PT Frequency 1x / week    PT Duration Other (comment)   10   PT Treatment/Interventions ADLs/Self Care Home Management;Moist Heat;Traction;Therapeutic exercise;Neuromuscular re-education;Balance training;Manual lymph drainage;Manual techniques;Wheelchair mobility training;Canalith Repostioning;Taping;Dry needling;Patient/family education;Functional mobility training;Scar mobilization;Orthotic Fit/Training;Compression bandaging;Therapeutic activities;Stair training    Consulted and Agree with Plan of Care Patient           Patient will benefit from skilled therapeutic intervention in order to improve the following deficits and impairments:  Pain,Decreased scar mobility,Decreased mobility,Decreased strength,Postural dysfunction,Improper body mechanics,Decreased coordination,Abnormal gait,Hypermobility,Difficulty walking,Decreased range of motion,Decreased endurance,Increased muscle spasms,Hypomobility,Impaired  flexibility  Visit Diagnosis: Sacrococcygeal disorders, not elsewhere classified  Other abnormalities of gait and mobility  Myalgia     Problem List Patient Active Problem List   Diagnosis Date Noted  . Cough variant asthma vs UCAS 11/04/2015  . Leukopenia 11/02/2012  . Chest pain 11/20/2010  . Hypertension 08/14/2010    Jerl Mina ,PT, DPT, E-RYT  05/29/2020, 12:53 PM  [place] MAIN [facility] SERVICES [address] [place], [place], [address] Phone: [phone]   Fax:  [phone]  Name: Caitlin Walker MRN: [id number] Date of Birth: Sep 13, 1945

## 2020-05-29 NOTE — Patient Instructions (Signed)
Pillow under hips during Deep core level 1 and 2   And do not push stomach with breathing Allow for lower hand to lower on exhale then upper hand

## 2020-06-05 ENCOUNTER — Other Ambulatory Visit: Payer: Self-pay

## 2020-06-05 ENCOUNTER — Ambulatory Visit: Payer: PPO | Attending: Family Medicine | Admitting: Physical Therapy

## 2020-06-05 DIAGNOSIS — M791 Myalgia, unspecified site: Secondary | ICD-10-CM | POA: Insufficient documentation

## 2020-06-05 DIAGNOSIS — M533 Sacrococcygeal disorders, not elsewhere classified: Secondary | ICD-10-CM | POA: Diagnosis present

## 2020-06-05 DIAGNOSIS — R2689 Other abnormalities of gait and mobility: Secondary | ICD-10-CM | POA: Insufficient documentation

## 2020-06-05 NOTE — Therapy (Signed)
[place] MAIN [facility] SERVICES [address] [place], [place], [address] Phone: [phone]   Fax:  [phone]  Physical Therapy Treatment  Patient Details  Name: Caitlin Walker MRN: [id number] Date of Birth: July 08, 1945 Referring Provider (PT): Milagros Evener MD   Encounter Date: 06/05/2020   PT End of Session - 06/05/20 1155    Visit Number 8    Number of Visits 14    Date for PT Re-Evaluation 07/17/20   PN 05/08/20 visit 4   PT Start Time 1104    PT Stop Time 1158    PT Time Calculation (min) 54 min           Past Medical History:  Diagnosis Date  . Chronic leukopenia DX  2011   MILD--  HEMATOLOGIST  --  DR Beryle Beams  . H/O hiatal hernia    TINY PER EGD 2007  . Hematuria   . Hemorrhoids    INTERNAL AND EXTERNAL   . History of colon polyps    BENIGN  . Hyperlipidemia   . Hypertension   . Ureterocele    LEFT  . Wears contact lenses     Past Surgical History:  Procedure Laterality Date  . APPENDECTOMY  1989  . BREAST BIOPSY Left    benign  . COLONOSCOPY N/A 08/01/2012   Procedure: COLONOSCOPY;  Surgeon: Jeryl Columbia, MD;  Location: [facility];  Service: Endoscopy;  Laterality: N/A;  pt. would like dan to do procedure  . CYSTO/  URETHRAL DILATION /  RIGHT RETROGRADE PYELOGRAM/ RIGHT URETEROSCOPY/  INSTILLATION THERAPY  06-18-2008  . CYSTOSCOPY/RETROGRADE/URETEROSCOPY Left 10/26/2013   Procedure: CYSTOSCOPY/RETROGRADE/DIAGNOSTIC DIGITAL URETEROSCOPY WITH LEFT URETERAL STENT PLACEMENT.;  Surgeon: Alexis Frock, MD;  Location: [facility];  Service: Urology;  Laterality: Left;  . DILATION AND CURETTAGE OF UTERUS  2001  . SUPRACERVICAL ABDOMINAL HYSTERECTOMY  02-25-2000   W/   BILATERAL SALPINGOOPHORECTOMY AND RECTOCELE REPAIR    There were no vitals filed for this visit.   Subjective Assessment - 06/05/20 1108    Subjective Pt was able to sit on soft chairs and also play a word game for 30 min and not  notice pain    Pertinent History 3 vaginal deliveries with one episiotomy, abdominal hysterectomy, appendectomy, Ureterocele, Hx of colon polyps, hemorrhoids. Bowel movements occur 2 x day . Pt has had to strain with BMS since she had to cut back on her greens . Daily water intake : 4 ( 16 fl oz) of water.  Pt walks 3 miles a day    Patient Stated Goals get rid of pain and be able to sit.              [facility] PT Assessment - 06/05/20 1110      Observation/Other Assessments   Observations narrow BOS standing, WBing on one leg,  clawed toes,      Squat   Comments anterior COM over knees, clawed toes, hyperextension of knees upon standing                      Pelvic Floor Special Questions - 06/05/20 1155    Pelvic Floor Internal Exam pt consented verbally and has no contraindications    Exam Type Vaginal    Palpation tightness at 5-7 o'clock-3 layers, lowered bladder no longer present    Strength fair squeeze, definite lift  PT Long Term Goals - 05/29/20 1107      PT LONG TERM GOAL #1   Title Pt will demo levelled shoulders/ pelvis across 2 visits in order to minimize pain and increase sitting    Time 2    Period Weeks    Status Achieved      PT LONG TERM GOAL #2   Title Pt will demo decreased L upper Q mm tensions, C/T, T/L hypomobility, abdominal scar restrictions in order to maintain reciprocal pattern between thorax and pelvis for her 3 miles walks    Time 6    Period Weeks    Status Achieved      PT LONG TERM GOAL #3   Title Pt will be ale to sit for 30 min without pain in order to go out with friends for dinner  ( 05/08/20: 23 min Visit 4 , 1/ 12: 30 min)  )    Time 6    Period Weeks    Status Achieved      PT LONG TERM GOAL #4   Title Pt will increase hip abduction strength from 3/5 B to > 4/5 B in order to minimize risk for worsening of pelvic dysfunctions and optimize pelvic girdle stability for walking    Time 4     Period Weeks    Status On-going      PT LONG TERM GOAL #5   Title Pt will be IND with flexibilty routine to minimize mm tightness around hips and legs and promote nutation of sacrum    Time 10    Period Weeks    Status On-going      PT LONG TERM GOAL #6   Title Pt will demo more nutation of sacrum to promote longer sitting periods > 30 min    Time 6    Period Weeks    Status Achieved      PT LONG TERM GOAL #7   Title Pt will decrease her FOTO score for Pelvic Pain from 58 pts to < 48 pts in order to return to ADLs  ( 05/08/20: 42 pts)    Time 10    Period Weeks    Status Achieved      PT LONG TERM GOAL #8   Title Pt will be able to drive for 2 hours with car seat modifications/ rest stop with manageable buttock pain (<2/10)  to visit brother out of town    Time 8    Period Weeks    Status New      PT LONG TERM GOAL  #9   TITLE Pt will demo proper pelvic floor coordination/ quick contraction of 5 reps, 3 sec with no lowered position of pelvic organs and no bearing down onto pelvic floor/ organs and proper sequential and circumferential contraction of all 3 mm layers of pelvic floor  in order to minimize worsening of prolapse and relapse of Sx    Time 10    Period Weeks    Status New      PT LONG TERM GOAL  #10   TITLE Pt will demo proper technique for stand to floor and report being able to lue on the floor doing deep core 1-2 HEP without LBP    Time 6    Period Weeks    Status New                 Plan - 06/05/20 1156    Clinical Impression Statement Pt is progressing well  with decreasing pelvic floor mm tightness. Pt tolerated internal Tx to minimize tightness at posterior mm at 3rd layer of mm. Advanced pt to squats with cues for proper technique. 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 Pt required cues for less  hyperextension of knees. Pt continues to benefit from skilled PT. Plan to advance with therabands at upcoming sessions    Examination-Activity Limitations Sit    Stability/Clinical Decision Making Evolving/Moderate complexity    Rehab Potential Good    PT Frequency 1x / week    PT Duration Other (comment)   10   PT Treatment/Interventions ADLs/Self Care Home Management;Moist Heat;Traction;Therapeutic exercise;Neuromuscular re-education;Balance training;Manual lymph drainage;Manual techniques;Wheelchair mobility training;Canalith Repostioning;Taping;Dry needling;Patient/family education;Functional mobility training;Scar mobilization;Orthotic Fit/Training;Compression bandaging;Therapeutic activities;Stair training    Consulted and Agree with Plan of Care Patient           Patient will benefit from skilled therapeutic intervention in order to improve the following deficits and impairments:  Pain,Decreased scar mobility,Decreased mobility,Decreased strength,Postural dysfunction,Improper body mechanics,Decreased coordination,Abnormal gait,Hypermobility,Difficulty walking,Decreased range of motion,Decreased endurance,Increased muscle spasms,Hypomobility,Impaired flexibility  Visit Diagnosis: No diagnosis found.     Problem List Patient Active Problem List   Diagnosis Date Noted  . Cough variant asthma vs UCAS 11/04/2015  . Leukopenia 11/02/2012  . Chest pain 11/20/2010  . Hypertension 08/14/2010    Jerl Mina ,PT, DPT, E-RYT  06/05/2020, 11:57 AM  [place] MAIN [facility] SERVICES [address] [place], [place], [address] Phone: [phone]   Fax:  [phone]  Name: Caitlin Walker MRN: [id number] Date of Birth: 21-Sep-1945

## 2020-06-05 NOTE — Patient Instructions (Signed)
Minisquat:  SPREAD TOES  Scoot buttocks back slight, hinge like you are looking at your reflection on a pond  Knees behind toes,  Inhale to "smell flowers"  Exhale on the rise "like rocket"  Do not lock knees, have more weight across ballmounds of feet, toes relaxed   10 reps x 3 x day   __

## 2020-06-12 ENCOUNTER — Ambulatory Visit: Payer: PPO | Admitting: Physical Therapy

## 2020-06-12 ENCOUNTER — Other Ambulatory Visit: Payer: Self-pay

## 2020-06-12 DIAGNOSIS — R2689 Other abnormalities of gait and mobility: Secondary | ICD-10-CM

## 2020-06-12 DIAGNOSIS — M791 Myalgia, unspecified site: Secondary | ICD-10-CM

## 2020-06-12 DIAGNOSIS — M533 Sacrococcygeal disorders, not elsewhere classified: Secondary | ICD-10-CM

## 2020-06-12 NOTE — Patient Instructions (Addendum)
PELVIC FLOOR / KEGEL EXERCISES   Pelvic floor/ Kegel exercises are used to strengthen the muscles in the base of your pelvis that are responsible for supporting your pelvic organs and preventing urine/feces leakage. Based on your therapist's recommendations, they can be performed while standing, sitting, or lying down. Imagine pelvic floor area as a diamond with pelvic landmarks: top =pubic bone, bottom tip=tailbone, sides=sitting bones (ischial tuberosities).    Make yourself aware of this muscle group by using these cues while coordinating your breath:  Inhale, feel pelvic floor diamond area lower like hammock towards your feet and ribcage/belly expanding. Pause. Let the exhale naturally and feel your belly sink, abdominal muscles hugging in around you and you may notice the pelvic diamond draws upward towards your head forming a umbrella shape. Give a squeeze during the exhalation like you are stopping the flow of urine. If you are squeezing the buttock muscles, try to give 50% less effort.   Common Errors:  Breath holding: If you are holding your breath, you may be bearing down against your bladder instead of pulling it up. If you belly bulges up while you are squeezing, you are holding your breath. Be sure to breathe gently in and out while exercising. Counting out loud may help you avoid holding your breath.  Accessory muscle use: You should not see or feel other muscle movement when performing pelvic floor exercises. When done properly, no one can tell that you are performing the exercises. Keep the buttocks, belly and inner thighs relaxed.  Overdoing it: Your muscles can fatigue and stop working for you if you over-exercise. You may actually leak more or feel soreness at the lower abdomen or rectum.  YOUR HOME EXERCISE PROGRAM  LONG HOLDS:   Position: on back with pillow under hips     Inhale and then exhale. Then squeeze the muscle and count aloud for 3 seconds. Rest with three  long breaths. (Be sure to let belly sink in with exhales and not push outward)  Perform 3 repetitions, 5  times/day                    DECREASE DOWNWARD PRESSURE ON  YOUR PELVIC FLOOR, ABDOMINAL, LOW BACK MUSCLES       PRESERVE YOUR PELVIC HEALTH LONG-TERM   ** SQUEEZE pelvic floor BEFORE YOUR SNEEZE, COUGH, LAUGH   ** EXHALE BEFORE YOU RISE AGAINST GRAVITY (lifting, sit to stand, from squat to stand)   ** LOG ROLL OUT OF BED INSTEAD OF CRUNCH/SIT-UP

## 2020-06-12 NOTE — Therapy (Signed)
[place] MAIN [facility] SERVICES [address] [place], [place], [address] Phone: [phone]   Fax:  [phone]  Physical Therapy Treatment  Patient Details  Name: Caitlin Walker MRN: [id number] Date of Birth: 10-Feb-1946 Referring Provider (PT): Milagros Evener MD   Encounter Date: 06/12/2020   PT End of Session - 06/12/20 1113    Visit Number 9    Number of Visits 14    Date for PT Re-Evaluation 07/17/20   PN 05/08/20 visit 4   PT Start Time 1102    PT Stop Time 1200    PT Time Calculation (min) 58 min    Activity Tolerance Patient tolerated treatment well           Past Medical History:  Diagnosis Date  . Chronic leukopenia DX  2011   MILD--  HEMATOLOGIST  --  DR Beryle Beams  . H/O hiatal hernia    TINY PER EGD 2007  . Hematuria   . Hemorrhoids    INTERNAL AND EXTERNAL   . History of colon polyps    BENIGN  . Hyperlipidemia   . Hypertension   . Ureterocele    LEFT  . Wears contact lenses     Past Surgical History:  Procedure Laterality Date  . APPENDECTOMY  1989  . BREAST BIOPSY Left    benign  . COLONOSCOPY N/A 08/01/2012   Procedure: COLONOSCOPY;  Surgeon: Jeryl Columbia, MD;  Location: [facility];  Service: Endoscopy;  Laterality: N/A;  pt. would like dan to do procedure  . CYSTO/  URETHRAL DILATION /  RIGHT RETROGRADE PYELOGRAM/ RIGHT URETEROSCOPY/  INSTILLATION THERAPY  06-18-2008  . CYSTOSCOPY/RETROGRADE/URETEROSCOPY Left 10/26/2013   Procedure: CYSTOSCOPY/RETROGRADE/DIAGNOSTIC DIGITAL URETEROSCOPY WITH LEFT URETERAL STENT PLACEMENT.;  Surgeon: Alexis Frock, MD;  Location: [facility];  Service: Urology;  Laterality: Left;  . DILATION AND CURETTAGE OF UTERUS  2001  . SUPRACERVICAL ABDOMINAL HYSTERECTOMY  02-25-2000   W/   BILATERAL SALPINGOOPHORECTOMY AND RECTOCELE REPAIR    There were no vitals filed for this visit.   Subjective Assessment - 06/12/20 1157    Subjective Pt was able to drive to  [place] without pain ( 24 min drive one way)    Pertinent History 3 vaginal deliveries with one episiotomy, abdominal hysterectomy, appendectomy, Ureterocele, Hx of colon polyps, hemorrhoids. Bowel movements occur 2 x day . Pt has had to strain with BMS since she had to cut back on her greens . Daily water intake : 4 ( 16 fl oz) of water.  Pt walks 3 miles a day    Patient Stated Goals get rid of pain and be able to sit.                          Pelvic Floor Special Questions - 06/12/20 1148    Pelvic Floor Internal Exam pt consented verbally and has no contraindications    Exam Type Vaginal    Palpation tightness at 7 o'clock-3rd layer, scar restriction R puborectalis    Strength fair squeeze, definite lift    Strength # of reps 3    Strength # of seconds 3             [facility] Adult PT Treatment/Exercise - 06/12/20 1150      Therapeutic Activites    Other Therapeutic Activities discussed remainging visits for pelvic floor training, explained physiology of strengthening  both types of muscle fibers  Neuro Re-ed    Neuro Re-ed Details  provided cues for pelvic flor endurance training      Modalities   Modalities Moist Heat      Moist Heat Therapy   Number Minutes Moist Heat 5 Minutes    Moist Heat Location Other (comment)   perineum through sheets     Manual Therapy   Internal Pelvic Floor STM/MWM to 7 oclock, R puborectalis                       PT Long Term Goals - 06/12/20 1105      PT LONG TERM GOAL #1   Title Pt will demo levelled shoulders/ pelvis across 2 visits in order to minimize pain and increase sitting    Time 2    Period Weeks    Status Achieved      PT LONG TERM GOAL #2   Title Pt will demo decreased L upper Q mm tensions, C/T, T/L hypomobility, abdominal scar restrictions in order to maintain reciprocal pattern between thorax and pelvis for her 3 miles walks    Time 6    Period Weeks    Status Achieved      PT LONG TERM  GOAL #3   Title Pt will be ale to sit for 30 min without pain in order to go out with friends for dinner  ( 05/08/20: 23 min Visit 4 , 1/ 12: 30 min)  )    Time 6    Period Weeks    Status Achieved      PT LONG TERM GOAL #4   Title Pt will increase hip abduction strength from 3/5 B to > 4/5 B in order to minimize risk for worsening of pelvic dysfunctions and optimize pelvic girdle stability for walking    Time 4    Period Weeks    Status On-going      PT LONG TERM GOAL #5   Title Pt will be IND with flexibilty routine to minimize mm tightness around hips and legs and promote nutation of sacrum    Time 10    Period Weeks    Status Achieved      PT LONG TERM GOAL #6   Title Pt will demo more nutation of sacrum to promote longer sitting periods > 30 min    Time 6    Period Weeks    Status Achieved      PT LONG TERM GOAL #7   Title Pt will decrease her FOTO score for Pelvic Pain from 58 pts to < 48 pts in order to return to ADLs  ( 05/08/20: 42 pts)    Time 10    Period Weeks    Status Achieved      PT LONG TERM GOAL #8   Title Pt will be able to sit at a restaurant for dinner with brother up to 1 hour with manageable buttock pain (<2/10)    Time 2    Period Weeks    Status Revised    Target Date 06/26/20      PT LONG TERM GOAL  #9   TITLE Pt will demo proper pelvic floor coordination/ quick contraction of 5 reps, 3 sec with no lowered position of pelvic organs and no bearing down onto pelvic floor/ organs and proper sequential and circumferential contraction of all 3 mm layers of pelvic floor  in order to minimize worsening of prolapse and relapse of Sx  Time 10    Period Weeks    Status On-going      PT LONG TERM GOAL  #10   TITLE Pt will demo proper technique for stand to floor and report being able to lue on the floor doing deep core 1-2 HEP without LBP    Time 6    Period Weeks    Status Partially Met                 Plan - 06/12/20 1155    Clinical  Impression Statement Pt demo'd less pelvic floor tightness and better coordination with contractions. Pt was appropriate for endruance pelvic floor training but she demo'd low reps and low endruance.  Anticipate this progression will help with minimizing nocturia 3-4 x night and plan to refer for sleep study if no improvement with nocturia. Pt continues to benefit from skilled PT.    Examination-Activity Limitations Sit    Stability/Clinical Decision Making Evolving/Moderate complexity    Rehab Potential Good    PT Frequency 1x / week    PT Duration Other (comment)   10   PT Treatment/Interventions ADLs/Self Care Home Management;Moist Heat;Traction;Therapeutic exercise;Neuromuscular re-education;Balance training;Manual lymph drainage;Manual techniques;Wheelchair mobility training;Canalith Repostioning;Taping;Dry needling;Patient/family education;Functional mobility training;Scar mobilization;Orthotic Fit/Training;Compression bandaging;Therapeutic activities;Stair training    Consulted and Agree with Plan of Care Patient           Patient will benefit from skilled therapeutic intervention in order to improve the following deficits and impairments:  Pain,Decreased scar mobility,Decreased mobility,Decreased strength,Postural dysfunction,Improper body mechanics,Decreased coordination,Abnormal gait,Hypermobility,Difficulty walking,Decreased range of motion,Decreased endurance,Increased muscle spasms,Hypomobility,Impaired flexibility  Visit Diagnosis: Sacrococcygeal disorders, not elsewhere classified  Other abnormalities of gait and mobility  Myalgia     Problem List Patient Active Problem List   Diagnosis Date Noted  . Cough variant asthma vs UCAS 11/04/2015  . Leukopenia 11/02/2012  . Chest pain 11/20/2010  . Hypertension 08/14/2010    Jerl Mina ,PT, DPT, E-RYT  06/12/2020, 11:58 AM  [place] MAIN [facility] SERVICES [address]  [place], [place], [address] Phone: [phone]   Fax:  [phone]  Name: Caitlin Walker MRN: [id number] Date of Birth: [birth year]

## 2020-06-19 ENCOUNTER — Ambulatory Visit: Payer: PPO | Admitting: Physical Therapy

## 2020-06-19 ENCOUNTER — Other Ambulatory Visit: Payer: Self-pay

## 2020-06-19 DIAGNOSIS — M533 Sacrococcygeal disorders, not elsewhere classified: Secondary | ICD-10-CM

## 2020-06-19 DIAGNOSIS — R2689 Other abnormalities of gait and mobility: Secondary | ICD-10-CM | POA: Diagnosis not present

## 2020-06-19 DIAGNOSIS — M791 Myalgia, unspecified site: Secondary | ICD-10-CM

## 2020-06-19 NOTE — Therapy (Signed)
[place] MAIN [facility] SERVICES [address] [place], [place], [address] Phone: [phone]   Fax:  [phone]  Physical Therapy Treatment / Discharge Summary  Patient Details  Name: Caitlin Walker MRN: [id number] Date of Birth: Dec 13, 1945 Referring Provider (PT): Milagros Evener MD   Encounter Date: 06/19/2020   PT End of Session - 06/19/20 1122    Visit Number 10    Number of Visits 14    Date for PT Re-Evaluation 07/17/20   PN 05/08/20 visit 4   PT Start Time 1104    PT Stop Time 1130    PT Time Calculation (min) 26 min    Activity Tolerance Patient tolerated treatment well           Past Medical History:  Diagnosis Date  . Chronic leukopenia DX  2011   MILD--  HEMATOLOGIST  --  DR Beryle Beams  . H/O hiatal hernia    TINY PER EGD 2007  . Hematuria   . Hemorrhoids    INTERNAL AND EXTERNAL   . History of colon polyps    BENIGN  . Hyperlipidemia   . Hypertension   . Ureterocele    LEFT  . Wears contact lenses     Past Surgical History:  Procedure Laterality Date  . APPENDECTOMY  1989  . BREAST BIOPSY Left    benign  . COLONOSCOPY N/A 08/01/2012   Procedure: COLONOSCOPY;  Surgeon: Jeryl Columbia, MD;  Location: [facility];  Service: Endoscopy;  Laterality: N/A;  pt. would like dan to do procedure  . CYSTO/  URETHRAL DILATION /  RIGHT RETROGRADE PYELOGRAM/ RIGHT URETEROSCOPY/  INSTILLATION THERAPY  06-18-2008  . CYSTOSCOPY/RETROGRADE/URETEROSCOPY Left 10/26/2013   Procedure: CYSTOSCOPY/RETROGRADE/DIAGNOSTIC DIGITAL URETEROSCOPY WITH LEFT URETERAL STENT PLACEMENT.;  Surgeon: Alexis Frock, MD;  Location: [facility];  Service: Urology;  Laterality: Left;  . DILATION AND CURETTAGE OF UTERUS  2001  . SUPRACERVICAL ABDOMINAL HYSTERECTOMY  02-25-2000   W/   BILATERAL SALPINGOOPHORECTOMY AND RECTOCELE REPAIR    There were no vitals filed for this visit.   Subjective Assessment - 06/19/20 1108    Subjective Pt  reported she sat at a restaurant for 35 min without pain when her brother visited her.    Pertinent History 3 vaginal deliveries with one episiotomy, abdominal hysterectomy, appendectomy, Ureterocele, Hx of colon polyps, hemorrhoids. Bowel movements occur 2 x day . Pt has had to strain with BMS since she had to cut back on her greens . Daily water intake : 4 ( 16 fl oz) of water.  Pt walks 3 miles a day    Patient Stated Goals get rid of pain and be able to sit.              [facility] PT Assessment - 06/19/20 1117      Floor to Stand   Comments difficulty with downward dog technique, required forearm propped on chair technique      AROM   Overall AROM Comments hip ext 20 deg B      Strength   Overall Strength Comments DF/EV, hip abd 4+/5 B                         [facility] Adult PT Treatment/Exercise - 06/19/20 1117      Therapeutic Activites    Other Therapeutic Activities discussed d/c , reassessed goals     Neuro Re-ed    Neuro Re-ed Details  cues for  proper sit to stand with hip abd/ER,  an d mini squat and side stepping                       PT Long Term Goals - 06/19/20 1118      PT LONG TERM GOAL #1   Title Pt will demo levelled shoulders/ pelvis across 2 visits in order to minimize pain and increase sitting    Time 2    Period Weeks    Status Achieved      PT LONG TERM GOAL #2   Title Pt will demo decreased L upper Q mm tensions, C/T, T/L hypomobility, abdominal scar restrictions in order to maintain reciprocal pattern between thorax and pelvis for her 3 miles walks    Time 6    Period Weeks    Status Achieved      PT LONG TERM GOAL #3   Title Pt will be ale to sit for 30 min without pain in order to go out with friends for dinner  ( 05/08/20: 23 min Visit 4 , 1/ 12: 30 min)  )    Time 6    Period Weeks    Status Achieved      PT LONG TERM GOAL #4   Title Pt will increase hip abduction strength from 3/5 B to > 4/5 B in order to minimize  risk for worsening of pelvic dysfunctions and optimize pelvic girdle stability for walking    Time 4    Period Weeks    Status Achieved      PT LONG TERM GOAL #5   Title Pt will be IND with flexibilty routine to minimize mm tightness around hips and legs and promote nutation of sacrum    Time 10    Period Weeks    Status Achieved      PT LONG TERM GOAL #6   Title Pt will demo more nutation of sacrum to promote longer sitting periods > 30 min    Time 6    Period Weeks    Status Achieved      PT LONG TERM GOAL #7   Title Pt will decrease her FOTO score for Pelvic Pain from 58 pts to < 48 pts in order to return to ADLs  ( 05/08/20: 42 pts, 06/19/20: 0 pts)    Time 10    Period Weeks    Status Achieved      PT LONG TERM GOAL #8   Title Pt will be able to sit at a restaurant for dinner with brother up to 1 hour with manageable buttock pain (<2/10)    Time 2    Period Weeks    Status Achieved      PT LONG TERM GOAL  #9   TITLE Pt will demo proper pelvic floor coordination/ quick contraction of 5 reps, 3 sec with no lowered position of pelvic organs and no bearing down onto pelvic floor/ organs and proper sequential and circumferential contraction of all 3 mm layers of pelvic floor  in order to minimize worsening of prolapse and relapse of Sx    Time 10    Period Weeks    Status Achieved      PT LONG TERM GOAL  #10   TITLE Pt will demo proper technique for stand to floor and report being able to lue on the floor doing deep core 1-2 HEP without LBP    Time 6    Period Weeks  Status Achieved                 Plan - 06/19/20 1131    Clinical Impression Statement Pt has achieved 100% of her goals. Her FOTO score for pain decreased 58 pts to  0 pts. Pt is able to sit for > 30 min without pain including having dinner with brother. Pt feels more confident to eventually drive on long trips to visit family.   Pt's pelvic girdle, spine, coccyx are no longer  misaligned.Pt demo'd no  more lowered position of pelvic organs.  Pt's back, hip, and pelvic floor mm are no longer tight and pt is IND with: _flexibility routine after her long walks _pelvic floor lengthening and contraction exercises.  _ functional squats, sit to stand and stand <> floor transfers.   Pt is ready for d/c. Thank you for your referral.    Examination-Activity Limitations Sit    Stability/Clinical Decision Making Evolving/Moderate complexity    Rehab Potential Good    PT Frequency 1x / week    PT Duration Other (comment)   10   PT Treatment/Interventions ADLs/Self Care Home Management;Moist Heat;Traction;Therapeutic exercise;Neuromuscular re-education;Balance training;Manual lymph drainage;Manual techniques;Wheelchair mobility training;Canalith Repostioning;Taping;Dry needling;Patient/family education;Functional mobility training;Scar mobilization;Orthotic Fit/Training;Compression bandaging;Therapeutic activities;Stair training    Consulted and Agree with Plan of Care Patient           Patient will benefit from skilled therapeutic intervention in order to improve the following deficits and impairments:  Pain,Decreased scar mobility,Decreased mobility,Decreased strength,Postural dysfunction,Improper body mechanics,Decreased coordination,Abnormal gait,Hypermobility,Difficulty walking,Decreased range of motion,Decreased endurance,Increased muscle spasms,Hypomobility,Impaired flexibility  Visit Diagnosis: Other abnormalities of gait and mobility  Sacrococcygeal disorders, not elsewhere classified  Myalgia     Problem List Patient Active Problem List   Diagnosis Date Noted  . Cough variant asthma vs UCAS 11/04/2015  . Leukopenia 11/02/2012  . Chest pain 11/20/2010  . Hypertension 08/14/2010    Jerl Mina ,PT, DPT, E-RYT  06/19/2020, 2:38 PM  [place] MAIN [facility] SERVICES [address] [place], [place], [address] Phone: [phone]   Fax:   [phone]  Name: Caitlin Walker MRN: [id number] Date of Birth: 1945/11/19

## 2020-06-19 NOTE — Patient Instructions (Addendum)
   Transition from standing to floor if you have hypertension or can not have your head lower than your heart  Wide squat in front of a CHAIR  Forearms onto the chair Lower knees down into double kneeling   floor   To get up Walk on your knees to the front of chair again Forearms onto the chair Inhale, exhale, pushing onto the forearms to life  lifts hips up, kneeping knees bent hands on thighs, then hips then pause HERE  to avoid (moving too quickly up/ blood rush) -->  knees glide forward and roll  Hips up instead of hinging spine up   * KEEP YOUR HEAD AND HEART LEVELLED , NEVER LETTING YOUR HEAD GET BELOW YOUR HEART  ___  Minisquat: Scoot buttocks back slight, hinge like you are looking at your reflection on a pond  Knees behind toes,  Inhale to "smell flowers"  Exhale on the rise "like rocket"  Do not lock knees, have more weight across ballmounds of feet, toes relaxed   THEN HALF step on both feet first lap with left foot leading down a hall way = 1 lap  Repeated with other foot leading =1 lap  2 laps each side

## 2020-06-20 DIAGNOSIS — D72819 Decreased white blood cell count, unspecified: Secondary | ICD-10-CM | POA: Diagnosis not present

## 2020-06-20 DIAGNOSIS — I10 Essential (primary) hypertension: Secondary | ICD-10-CM | POA: Diagnosis not present

## 2020-06-20 DIAGNOSIS — G4700 Insomnia, unspecified: Secondary | ICD-10-CM | POA: Diagnosis not present

## 2020-06-20 DIAGNOSIS — E7800 Pure hypercholesterolemia, unspecified: Secondary | ICD-10-CM | POA: Diagnosis not present

## 2020-06-24 DIAGNOSIS — E7800 Pure hypercholesterolemia, unspecified: Secondary | ICD-10-CM | POA: Diagnosis not present

## 2020-06-24 DIAGNOSIS — D72819 Decreased white blood cell count, unspecified: Secondary | ICD-10-CM | POA: Diagnosis not present

## 2020-06-24 DIAGNOSIS — Z0000 Encounter for general adult medical examination without abnormal findings: Secondary | ICD-10-CM | POA: Diagnosis not present

## 2020-06-24 DIAGNOSIS — Z862 Personal history of diseases of the blood and blood-forming organs and certain disorders involving the immune mechanism: Secondary | ICD-10-CM | POA: Diagnosis not present

## 2020-06-24 DIAGNOSIS — K219 Gastro-esophageal reflux disease without esophagitis: Secondary | ICD-10-CM | POA: Diagnosis not present

## 2020-06-24 DIAGNOSIS — I10 Essential (primary) hypertension: Secondary | ICD-10-CM | POA: Diagnosis not present

## 2020-06-24 DIAGNOSIS — J45909 Unspecified asthma, uncomplicated: Secondary | ICD-10-CM | POA: Diagnosis not present

## 2020-06-24 DIAGNOSIS — G4700 Insomnia, unspecified: Secondary | ICD-10-CM | POA: Diagnosis not present

## 2020-06-26 ENCOUNTER — Ambulatory Visit: Payer: PPO | Admitting: Physical Therapy

## 2020-06-26 ENCOUNTER — Other Ambulatory Visit: Payer: Self-pay

## 2020-07-03 ENCOUNTER — Ambulatory Visit: Payer: PPO | Admitting: Physical Therapy

## 2020-07-04 ENCOUNTER — Other Ambulatory Visit: Payer: Self-pay | Admitting: Family Medicine

## 2020-07-04 DIAGNOSIS — M810 Age-related osteoporosis without current pathological fracture: Secondary | ICD-10-CM

## 2020-07-07 ENCOUNTER — Encounter: Payer: Self-pay | Admitting: Cardiovascular Disease

## 2020-07-07 NOTE — Progress Notes (Unsigned)
Cardiology Office Note   Date:  07/08/2020   ID:  ARLYN BUMPUS, DOB Jul 03, 1945, MRN [id number]  PCP:  Lawerance Cruel, MD  Cardiologist:   Mertie Moores, MD   Chief Complaint  Patient presents with  . Hypertension   1. Hypertension 2. Insomnia   Caitlin Walker is a 75 year old female with a history of hypertension. She has done very well. She's not having episodes of chest pain or shortness of breath. She avoids eating any extra salt. She exercises several times a week.  She is having trouble with insomnia.  May 12, 2012: She has been doing well from a cardiac standpoint. She has had some lightheadness. She also has had some leg cramps. She has not been exercising. She has been watching her salt intake. She still works full time in the [employer].  Oct. 1, 2014:  Caitlin Walker is doing ok. Still has leg cramps. She has been busy - her was diagnosed with breast cancer. She is running around lots . No CP or dyspnea. Has occasional indigestion. She works in the [employer].   Nov. 12, 2014 She is doing ok  Sep 18, 2013:  Nov. 9, 2015:  Caitlin Walker is doing well.  She has retired. Has developed a neuroma on the bottom of her foot. BP has been a little higher.   Sep 17, 2014:   Caitlin Walker is a 75 y.o. female who presents for HTN She is doing well. No CP,  BP has been well controlled.   Nov. 14, 2016:  Doing well.  Has had some GI bleeding - has not found the source yet.  Has had upper ECG.  CT , barium swallow .  Lipids have improved significantly   Oct 01, 2015: Caitlin Walker is seen back today for eval of her HTN.  Was diagnosed with Purtussis in Feb.   Still coughing  BP is well controlled.  Remains active   03/30/2016:  Has been a rough year.  Is separated from her husband . Has had lots of anxiety  BP has been well controlled.  Occasional , rare episodes of CP   April 05, 2017:  Doing well from a cardiac standpoint .    BP has been well controled.     Just did a 6 night stretch of work  ( 52 hours )  Feels well   April 11, 2018:  Caitlin Walker is seen today for follow-up of her hypertension.  Her blood pressure is mildly elevated today. Has not tolerated amlodipine, vasotec, Norvasc, bystolic,  Valsartan   December 04, 2019:  Caitlin Walker is seen today for follow-up of her hypertension. BP is a bit elevated.  Tries to avoid salty foods.     Had hemmorhoid surgery in Feb.   July 08, 2020: Caitlin Walker is seen today for follow up of her HTN BP looks great today  We have recently added Spironolactone and increased hydralazine      Past Medical History:  Diagnosis Date  . Chronic leukopenia DX  2011   MILD--  HEMATOLOGIST  --  DR Beryle Beams  . H/O hiatal hernia    TINY PER EGD 2007  . Hematuria   . Hemorrhoids    INTERNAL AND EXTERNAL   . History of colon polyps    BENIGN  . Hyperlipidemia   . Hypertension   . Ureterocele    LEFT  . Wears contact lenses     Past Surgical History:  Procedure Laterality Date  . APPENDECTOMY  1989  . BREAST BIOPSY Left    benign  . COLONOSCOPY N/A 08/01/2012   Procedure: COLONOSCOPY;  Surgeon: Jeryl Columbia, MD;  Location: [facility];  Service: Endoscopy;  Laterality: N/A;  pt. would like dan to do procedure  . CYSTO/  URETHRAL DILATION /  RIGHT RETROGRADE PYELOGRAM/ RIGHT URETEROSCOPY/  INSTILLATION THERAPY  06-18-2008  . CYSTOSCOPY/RETROGRADE/URETEROSCOPY Left 10/26/2013   Procedure: CYSTOSCOPY/RETROGRADE/DIAGNOSTIC DIGITAL URETEROSCOPY WITH LEFT URETERAL STENT PLACEMENT.;  Surgeon: Alexis Frock, MD;  Location: [facility];  Service: Urology;  Laterality: Left;  . DILATION AND CURETTAGE OF UTERUS  2001  . SUPRACERVICAL ABDOMINAL HYSTERECTOMY  02-25-2000   W/   BILATERAL SALPINGOOPHORECTOMY AND RECTOCELE REPAIR     Current Outpatient Medications  Medication Sig Dispense Refill  . albuterol (VENTOLIN HFA) 108 (90 Base) MCG/ACT inhaler SMARTSIG:2 Puff(s) By Mouth Every 4 Hours  PRN    . calcium carbonate (OS-CAL) 600 MG TABS Take 600 mg by mouth daily with breakfast.     . fluticasone (FLONASE) 50 MCG/ACT nasal spray Place 2 sprays into both nostrils daily.    . hydrALAZINE (APRESOLINE) 50 MG tablet Take 1 tablet (50 mg total) by mouth 3 (three) times daily. 270 tablet 1  . losartan (COZAAR) 100 MG tablet Take 1 tablet (100 mg total) by mouth daily. 90 tablet 3  . magnesium oxide (MAG-OX) 400 MG tablet Take 400 mg by mouth every evening.     Marland Kitchen spironolactone (ALDACTONE) 25 MG tablet Take 1 tablet (25 mg total) by mouth daily. 90 tablet 3  . zolpidem (AMBIEN) 10 MG tablet Take 1 tablet by mouth at bedtime as needed for sleep.  5   No current facility-administered medications for this visit.    Allergies:   Amlodipine, Bystolic [nebivolol hcl], Diovan [valsartan], Metoprolol, Olmesartan, Prednisone, Trazodone, Bactrim, Flomax [tamsulosin hcl], and Sulfa antibiotics    Social History:  The patient  reports that she has never smoked. She has never used smokeless tobacco. She reports that she does not drink alcohol and does not use drugs.   Family History:  The patient's family history includes Colon cancer in her sister; Emphysema in her father; Heart disease in her father; Hyperlipidemia in her brother, sister, and sister; Hypertension in her brother, brother, brother, father, sister, sister, sister, sister, and sister.    ROS: As noted in the current history.  Otherwise the review of systems is negative.  Physical Exam: Blood pressure 114/66, pulse 72, height 5\' 4"  (1.626 m), weight 157 lb 9.6 oz (71.5 kg), SpO2 99 %.  GEN:  Well nourished, well developed in no acute distress HEENT: Normal NECK: No JVD; No carotid bruits LYMPHATICS: No lymphadenopathy CARDIAC: RRR , no murmurs, rubs, gallops RESPIRATORY:  Clear to auscultation without rales, wheezing or rhonchi  ABDOMEN: Soft, non-tender, non-distended MUSCULOSKELETAL:  No edema; No deformity  SKIN: Warm and  dry NEUROLOGIC:  Alert and oriented x 3    EKG:    Recent Labs: 08/27/2019: ALT 14; Hemoglobin 12.3; Platelets 327 04/02/2020: BUN 10; Creatinine, Ser 0.69; Potassium 4.7; Sodium 138    Lipid Panel    Component Value Date/Time   CHOL 187 03/15/2015 1219   TRIG 83 03/15/2015 1219   HDL 89 03/15/2015 1219   CHOLHDL 2.1 03/15/2015 1219   VLDL 17 03/15/2015 1219   LDLCALC 81 03/15/2015 1219      Wt Readings from Last 3 Encounters:  07/08/20 157 lb 9.6 oz (71.5 kg)  04/02/20 158 lb (71.7 kg)  01/01/20 159 lb 9.6 oz (72.4 kg)      Other studies Reviewed: Additional studies/ records that were reviewed today include: . Review of the above records demonstrates:    ASSESSMENT AND PLAN:  1.  Essential hypertension:     BP looks great on Spironolactone 25 mg a day and hydralazine  Cont meds. Return to see an APP in 6 months with BMP     Labs/ tests ordered today include:  Orders Placed This Encounter  Procedures  . Basic metabolic panel      Mertie Moores, MD  07/08/2020 5:40 PM    [facility] [place], [place], [place]  [address] Phone: [phone]; Fax: [phone]

## 2020-07-08 ENCOUNTER — Encounter: Payer: Self-pay | Admitting: Cardiovascular Disease

## 2020-07-08 ENCOUNTER — Ambulatory Visit: Payer: PPO | Admitting: Cardiovascular Disease

## 2020-07-08 ENCOUNTER — Other Ambulatory Visit: Payer: Self-pay

## 2020-07-08 VITALS — BP 114/66 | HR 72 | Ht 64.0 in | Wt 157.6 lb

## 2020-07-08 DIAGNOSIS — I10 Essential (primary) hypertension: Secondary | ICD-10-CM

## 2020-07-08 NOTE — Patient Instructions (Signed)
Medication Instructions:  Your physician recommends that you continue on your current medications as directed. Please refer to the Current Medication list given to you today.  *If you need a refill on your cardiac medications before your next appointment, please call your pharmacy*   Lab Work: Your physician recommends that you return for lab work in: 6 months on the day of or a few days before your office visit.  You will need to FAST for this appointment - nothing to eat or drink after midnight the night before except water.    Testing/Procedures: none   Follow-Up: At [facility], you and your health needs are our priority.  As part of our continuing mission to provide you with exceptional heart care, we have created designated Provider Care Teams.  These Care Teams include your primary Cardiologist (physician) and Advanced Practice Providers (APPs -  Physician Assistants and Nurse Practitioners) who all work together to provide you with the care you need, when you need it.  Your next appointment:   6 month(s)  The format for your next appointment:   In Person  Provider:   You will see one of the following Advanced Practice Providers on your designated Care Team:    Richardson Dopp, PA-C  [place], [place]

## 2020-07-09 ENCOUNTER — Other Ambulatory Visit: Payer: PPO

## 2020-07-12 ENCOUNTER — Other Ambulatory Visit: Payer: Self-pay | Admitting: Cardiovascular Disease

## 2020-07-24 DIAGNOSIS — G4700 Insomnia, unspecified: Secondary | ICD-10-CM | POA: Diagnosis not present

## 2020-07-24 DIAGNOSIS — M818 Other osteoporosis without current pathological fracture: Secondary | ICD-10-CM | POA: Diagnosis not present

## 2020-07-24 DIAGNOSIS — J45909 Unspecified asthma, uncomplicated: Secondary | ICD-10-CM | POA: Diagnosis not present

## 2020-07-24 DIAGNOSIS — I10 Essential (primary) hypertension: Secondary | ICD-10-CM | POA: Diagnosis not present

## 2020-07-24 DIAGNOSIS — K219 Gastro-esophageal reflux disease without esophagitis: Secondary | ICD-10-CM | POA: Diagnosis not present

## 2020-07-24 DIAGNOSIS — E7800 Pure hypercholesterolemia, unspecified: Secondary | ICD-10-CM | POA: Diagnosis not present

## 2020-09-24 DIAGNOSIS — J45909 Unspecified asthma, uncomplicated: Secondary | ICD-10-CM | POA: Diagnosis not present

## 2020-09-24 DIAGNOSIS — I10 Essential (primary) hypertension: Secondary | ICD-10-CM | POA: Diagnosis not present

## 2020-09-24 DIAGNOSIS — M818 Other osteoporosis without current pathological fracture: Secondary | ICD-10-CM | POA: Diagnosis not present

## 2020-09-24 DIAGNOSIS — E7800 Pure hypercholesterolemia, unspecified: Secondary | ICD-10-CM | POA: Diagnosis not present

## 2020-09-24 DIAGNOSIS — G4700 Insomnia, unspecified: Secondary | ICD-10-CM | POA: Diagnosis not present

## 2020-09-24 DIAGNOSIS — K219 Gastro-esophageal reflux disease without esophagitis: Secondary | ICD-10-CM | POA: Diagnosis not present

## 2020-10-05 ENCOUNTER — Other Ambulatory Visit: Payer: Self-pay | Admitting: Physician Assistant

## 2020-10-12 IMAGING — MR MR PELVIS WO/W CM
8 of 12 series · 34 of 48 positions shown · IV contrast (multihance)
Comparison: None.

CLINICAL DATA: Numbness and pain in the lower pelvic area and
buttock region.

EXAM:
MRI PELVIS WITHOUT AND WITH CONTRAST
TECHNIQUE: Multiplanar multisequence MR imaging of the pelvis was performed
both before and after administration of intravenous contrast.
CONTRAST:  15mL MULTIHANCE GADOBENATE DIMEGLUMINE 529 MG/ML IV SOLN

[Series 2: cor haste · coronal · 6.0mm · 0.78mm/px · 5 of 25 slices shown]
[im 1/25]
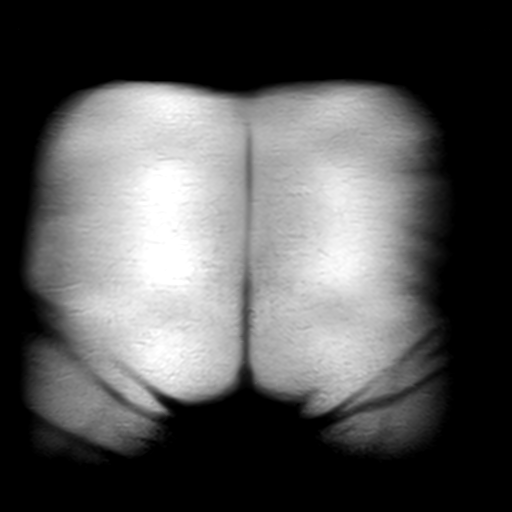
[im 7/25]
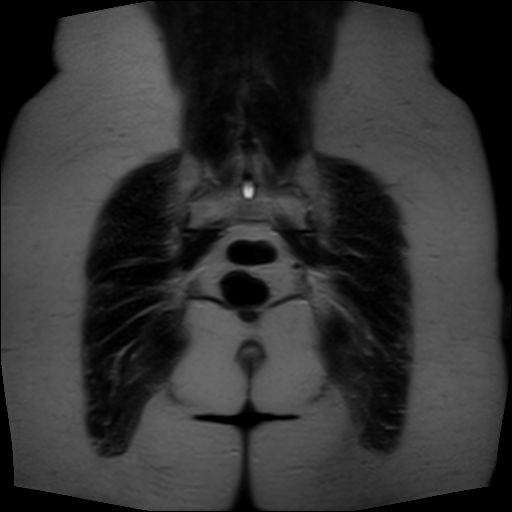
[im 13/25]
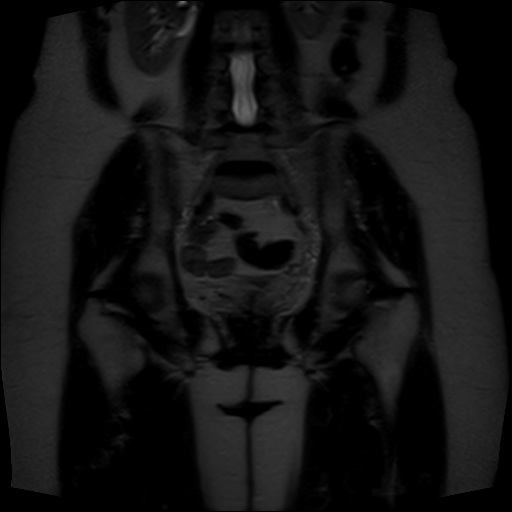
[im 19/25]
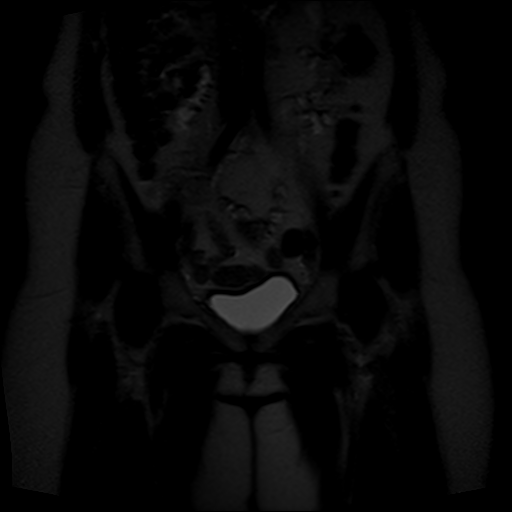
[im 25/25]
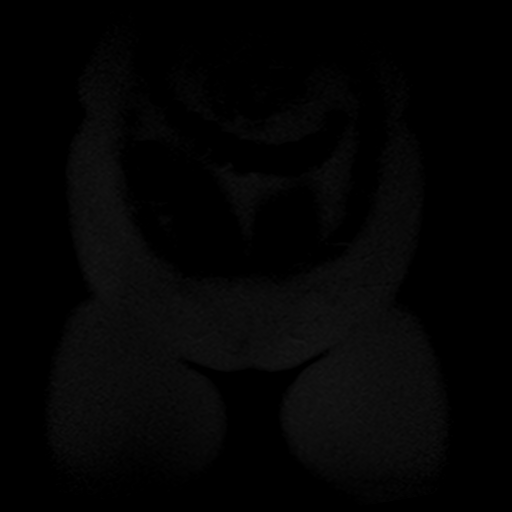

[Series 3: t2_tse_sag · sagittal · 5.0mm · 1.05mm/px · 6 of 27 slices shown]
[im 1/27]
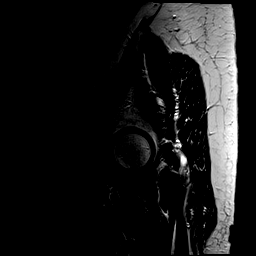
[im 6/27]
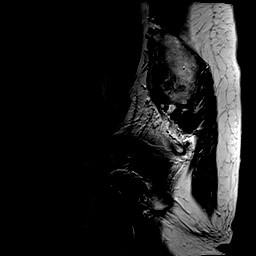
[im 11/27]
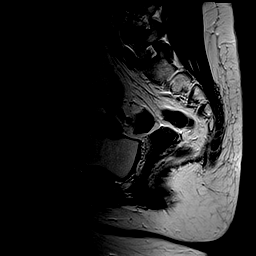
[im 16/27]
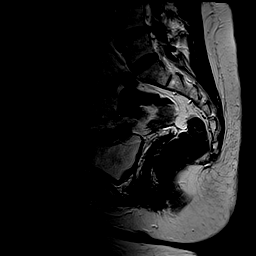
[im 21/27]
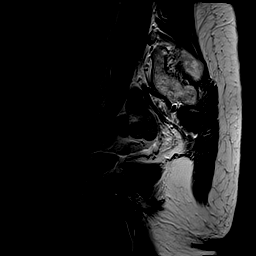
[im 27/27]
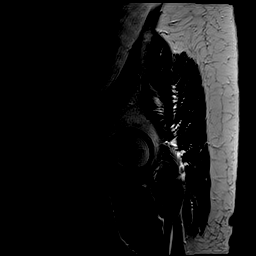

[Series 4: t2_tse axial · axial · 7.0mm · 0.98mm/px · z∈[-95,+133]mm · 4 of 26 slices shown]
[im 1/26]
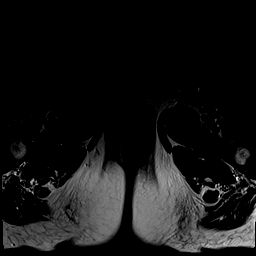
[im 9/26]
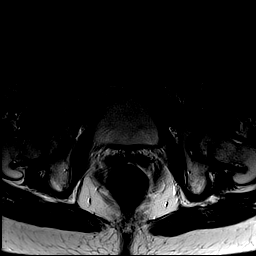
[im 17/26]
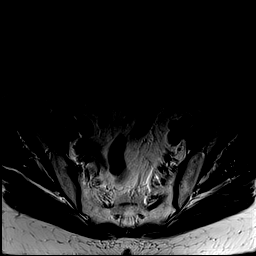
[im 26/26]
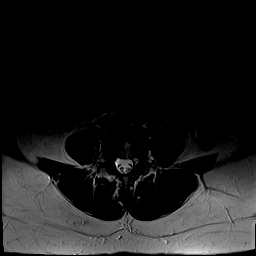

[Series 5: t2_tse axial fs · axial · 7.0mm · 0.98mm/px · z∈[-95,+133]mm · 4 of 26 slices shown]
[im 1/26]
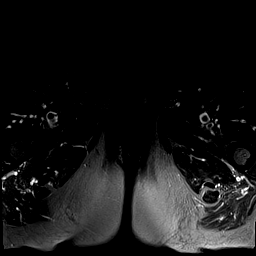
[im 9/26]
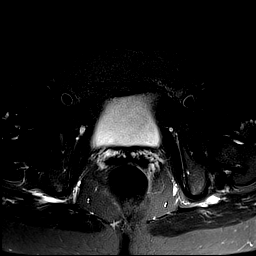
[im 17/26]
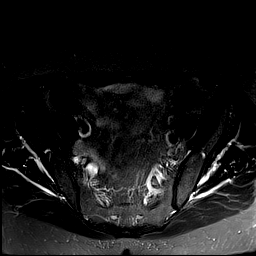
[im 26/26]
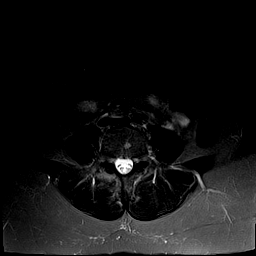

[Series 6: axial spgr · axial · 7.0mm · 0.98mm/px · z∈[-22,+133]mm · 3 of 18 slices shown (1 of 2)]
[im 1/18]
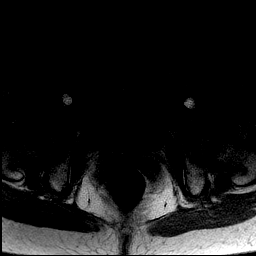
[im 9/18]
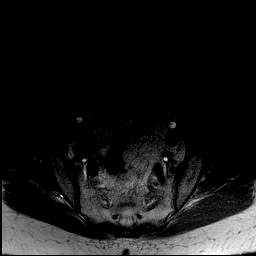
[im 18/18]
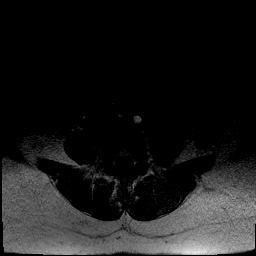

[Series 7: axial spgr · axial · 7.0mm · 0.98mm/px · z∈[-95,+133]mm · 4 of 26 slices shown (2 of 2)]
[im 1/26]
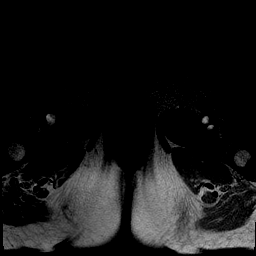
[im 9/26]
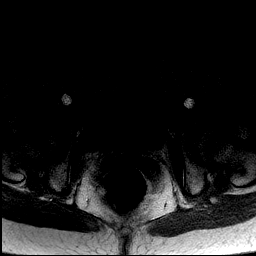
[im 17/26]
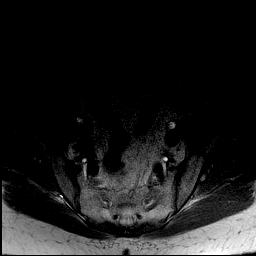
[im 26/26]
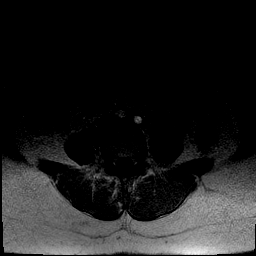

[Series 8: axial spgr pre · axial · non-contrast · 7.0mm · 0.49mm/px · z∈[-95,+133]mm · 4 of 26 slices shown]
[im 1/26]
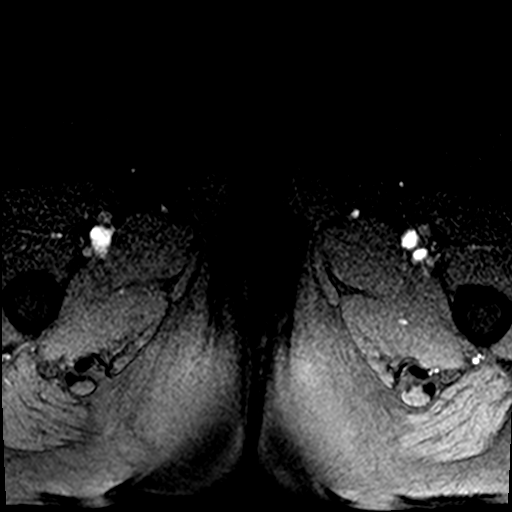
[im 9/26]
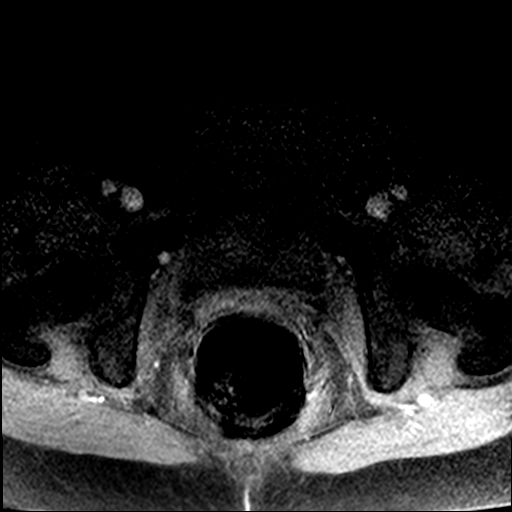
[im 17/26]
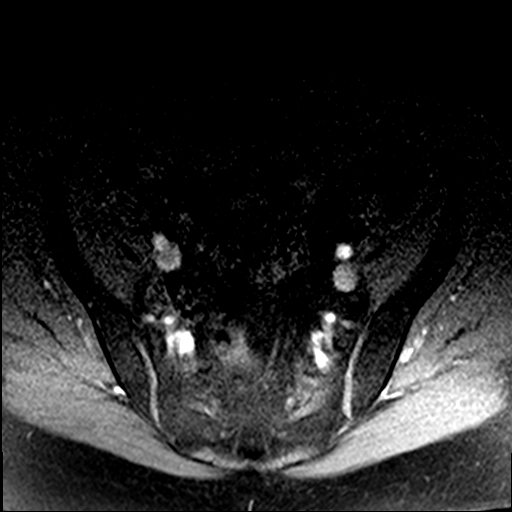
[im 26/26]
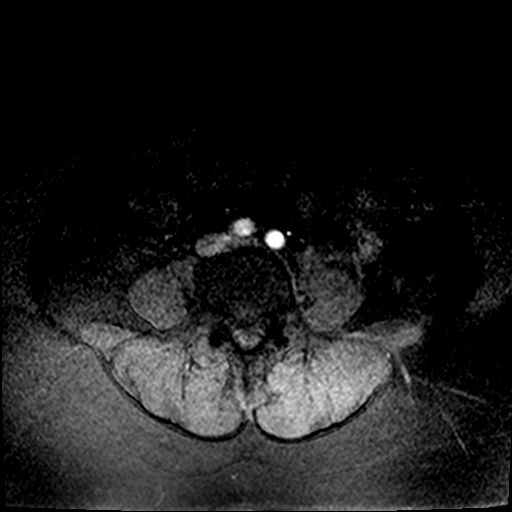

[Series 9: axial spgr post · axial · 7.0mm · 0.49mm/px · z∈[-95,+133]mm · 4 of 26 slices shown]
[im 1/26]
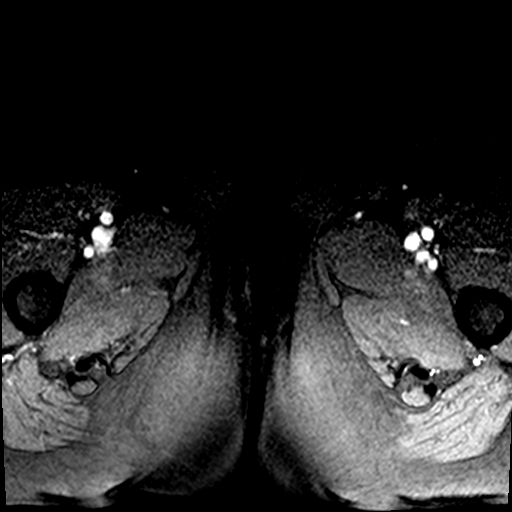
[im 9/26]
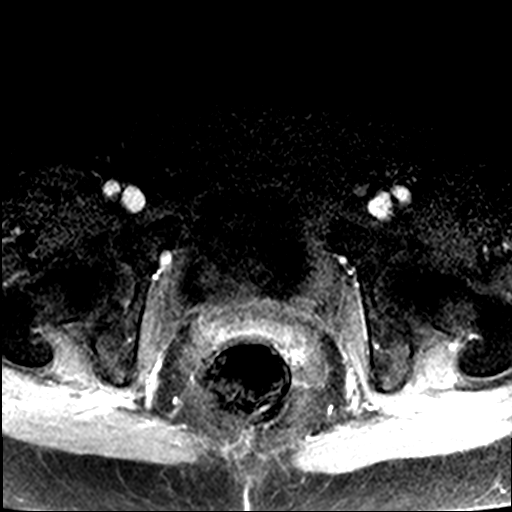
[im 17/26]
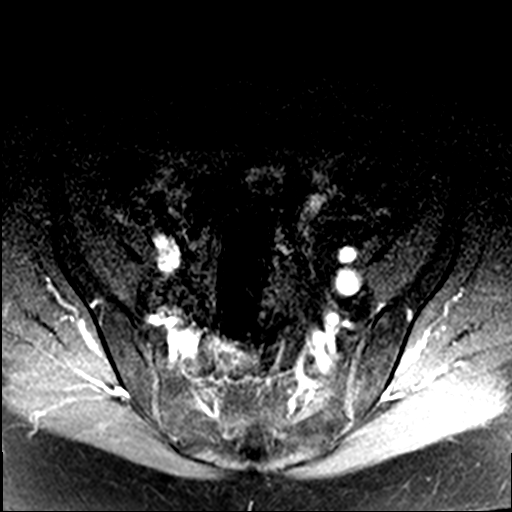
[im 26/26]
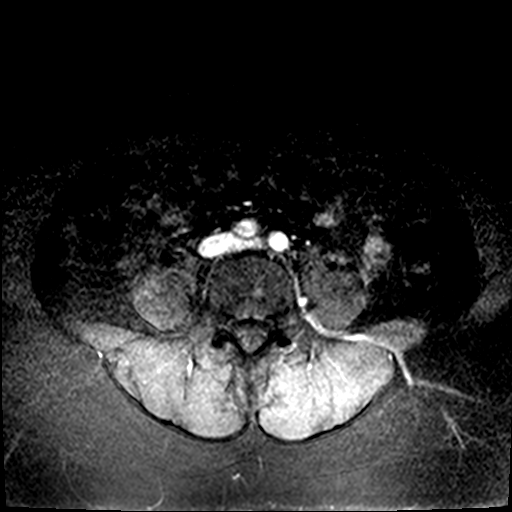

[34 of 48 positions shown; findings below may reference images not displayed]

FINDINGS: Urinary Tract: Bladder unremarkable. Urethra unremarkable without
evidence for urethral diverticulum.

Bowel: No small bowel or colonic dilatation within the visualized
pelvis. Diverticular changes noted in the sigmoid colon. No findings
to suggest perirectal fistula or abscess.

Vascular/Lymphatic: Normal flow signal within dominant arterial and
venous anatomy of each pelvic sidewall. No evidence for pelvic
lymphadenopathy

Reproductive: Status post hysterectomy. Vagina and cervix
unremarkable by MRI.

Other:  No intraperitoneal free fluid.

Musculoskeletal: No suspicious marrow signal or marrow enhancement
within the visualized bony anatomy. No evidence for pre sacral soft
tissue mass.
IMPRESSION: No findings to explain the patient's history of low pelvic and
buttock pain.

## 2020-11-18 ENCOUNTER — Other Ambulatory Visit: Payer: Self-pay | Admitting: Family Medicine

## 2020-11-18 DIAGNOSIS — Z1231 Encounter for screening mammogram for malignant neoplasm of breast: Secondary | ICD-10-CM

## 2020-11-21 ENCOUNTER — Other Ambulatory Visit: Payer: Self-pay | Admitting: Cardiovascular Disease

## 2020-11-25 DIAGNOSIS — G4700 Insomnia, unspecified: Secondary | ICD-10-CM | POA: Diagnosis not present

## 2020-11-25 DIAGNOSIS — E7800 Pure hypercholesterolemia, unspecified: Secondary | ICD-10-CM | POA: Diagnosis not present

## 2020-11-25 DIAGNOSIS — M818 Other osteoporosis without current pathological fracture: Secondary | ICD-10-CM | POA: Diagnosis not present

## 2020-11-25 DIAGNOSIS — K219 Gastro-esophageal reflux disease without esophagitis: Secondary | ICD-10-CM | POA: Diagnosis not present

## 2020-11-25 DIAGNOSIS — I10 Essential (primary) hypertension: Secondary | ICD-10-CM | POA: Diagnosis not present

## 2020-11-25 DIAGNOSIS — J45909 Unspecified asthma, uncomplicated: Secondary | ICD-10-CM | POA: Diagnosis not present

## 2020-11-26 DIAGNOSIS — U071 COVID-19: Secondary | ICD-10-CM | POA: Diagnosis not present

## 2020-11-26 DIAGNOSIS — Z20822 Contact with and (suspected) exposure to covid-19: Secondary | ICD-10-CM | POA: Diagnosis not present

## 2020-12-03 DIAGNOSIS — Z20822 Contact with and (suspected) exposure to covid-19: Secondary | ICD-10-CM | POA: Diagnosis not present

## 2020-12-07 DIAGNOSIS — Z20822 Contact with and (suspected) exposure to covid-19: Secondary | ICD-10-CM | POA: Diagnosis not present

## 2020-12-08 DIAGNOSIS — Z20822 Contact with and (suspected) exposure to covid-19: Secondary | ICD-10-CM | POA: Diagnosis not present

## 2020-12-10 ENCOUNTER — Other Ambulatory Visit: Payer: Self-pay | Admitting: *Deleted

## 2020-12-10 DIAGNOSIS — Z20822 Contact with and (suspected) exposure to covid-19: Secondary | ICD-10-CM | POA: Diagnosis not present

## 2020-12-10 MED ORDER — LOSARTAN POTASSIUM 100 MG PO TABS: 100.0000 mg | ORAL_TABLET | Freq: Every day | ORAL | 3 refills | Status: DC

## 2020-12-13 ENCOUNTER — Other Ambulatory Visit: Payer: PPO

## 2021-01-09 ENCOUNTER — Other Ambulatory Visit: Payer: Self-pay

## 2021-01-09 ENCOUNTER — Ambulatory Visit
Admission: RE | Admit: 2021-01-09 | Discharge: 2021-01-09 | Disposition: A | Discharge status: Home / Self Care | Payer: PPO | Source: Ambulatory Visit

## 2021-01-09 DIAGNOSIS — Z1231 Encounter for screening mammogram for malignant neoplasm of breast: Secondary | ICD-10-CM

## 2021-01-21 DIAGNOSIS — J45909 Unspecified asthma, uncomplicated: Secondary | ICD-10-CM | POA: Diagnosis not present

## 2021-01-21 DIAGNOSIS — K219 Gastro-esophageal reflux disease without esophagitis: Secondary | ICD-10-CM | POA: Diagnosis not present

## 2021-01-21 DIAGNOSIS — M818 Other osteoporosis without current pathological fracture: Secondary | ICD-10-CM | POA: Diagnosis not present

## 2021-01-21 DIAGNOSIS — G4700 Insomnia, unspecified: Secondary | ICD-10-CM | POA: Diagnosis not present

## 2021-01-21 DIAGNOSIS — I10 Essential (primary) hypertension: Secondary | ICD-10-CM | POA: Diagnosis not present

## 2021-01-21 DIAGNOSIS — E7800 Pure hypercholesterolemia, unspecified: Secondary | ICD-10-CM | POA: Diagnosis not present

## 2021-01-22 ENCOUNTER — Other Ambulatory Visit: Payer: Self-pay

## 2021-01-22 ENCOUNTER — Other Ambulatory Visit: Payer: PPO | Admitting: *Deleted

## 2021-01-22 DIAGNOSIS — I10 Essential (primary) hypertension: Secondary | ICD-10-CM

## 2021-01-22 LAB — BASIC METABOLIC PANEL WITH GFR
BUN/Creatinine Ratio: 15 (ref 12–28)
BUN: 10 mg/dL (ref 8–27)
CO2: 27 mmol/L (ref 20–29)
Calcium: 9.7 mg/dL (ref 8.7–10.3)
Chloride: 99 mmol/L (ref 96–106)
Creatinine, Ser: 0.67 mg/dL (ref 0.57–1.00)
Glucose: 78 mg/dL (ref 65–99)
Potassium: 4.4 mmol/L (ref 3.5–5.2)
Sodium: 138 mmol/L (ref 134–144)
eGFR: 92 mL/min/{1.73_m2}

## 2021-01-28 ENCOUNTER — Ambulatory Visit (INDEPENDENT_AMBULATORY_CARE_PROVIDER_SITE_OTHER): Payer: PPO

## 2021-01-28 ENCOUNTER — Encounter: Payer: Self-pay | Admitting: Adult Health

## 2021-01-28 ENCOUNTER — Ambulatory Visit: Payer: PPO | Admitting: Adult Health

## 2021-01-28 ENCOUNTER — Other Ambulatory Visit: Payer: Self-pay

## 2021-01-28 VITALS — BP 128/82 | HR 81 | Temp 98.3°F | Ht 64.0 in | Wt 159.8 lb

## 2021-01-28 DIAGNOSIS — J45991 Cough variant asthma: Secondary | ICD-10-CM

## 2021-01-28 DIAGNOSIS — R059 Cough, unspecified: Secondary | ICD-10-CM

## 2021-01-28 MED ORDER — METHYLPREDNISOLONE ACETATE 80 MG/ML IJ SUSP
80.0000 mg | Freq: Once | INTRAMUSCULAR | Status: AC
  Administered 2021-01-28: 80 mg via INTRAMUSCULAR

## 2021-01-28 MED ORDER — BENZONATATE 200 MG PO CAPS: 200.0000 mg | ORAL_CAPSULE | Freq: Three times a day (TID) | ORAL | 1 refills | Status: DC | PRN

## 2021-01-28 NOTE — Progress Notes (Signed)
@Patient  ID: Caitlin Walker, female    DOB: Dec 11, 1945, 75 y.o.   MRN: [id number]  Chief Complaint  Patient presents with   Follow-up    Referring provider: Lawerance Cruel, MD  HPI: 75 year old female never smoker followed for chronic cough  TEST/EVENTS :  Imaging: CTA 10/15/2017-no pulmonary embolism, mild lower lobe subsegmental atelectasis. CXR 06/28/2019-no acute process.    PFTs: FVC 2.44 [9 9%], FEV1 2.06 [99%], F/F 84, TLC 4.29 [82%], DLCO 14.75 [73%] Minimal diffusion defect   Labs: CBC 06/28/2019-WBC 4.2, eos 4%, absolute eosinophil count 168 IgE 06/28/2019-241  Cough work up :  Pets: No pets Occupation: Retired Licensed conveyancer Exposures: No known exposures.  No mold, hot tub, Jacuzzi or no down pillows or comforters Smoking history: Never smoker Travel history: Recently lived in [place].  No significant recent travel Relevant family history: Father died of emphysema.  He was a smoker  01/28/2021 Follow up : Chronic Patient returns for a work in visit.  Patient has been seen in the past for chronic cough.  That has been going on and off for years.  Last visit patient had been recommended to begin chlorpheniramine 8 mg 3 times daily as needed, Prilosec twice daily.  And Flonase.  Patient has significant improvement in cough with total resolution.  Previous chest x-ray. Patient says she had been doing well up until 2 months ago.  Patient developed COVID-19 infection.  Had cold-like symptoms.  And since then has had an ongoing cough that has been severe at times.  She has restarted Prilosec twice daily Flonase and Chlor-Trimeton 8 mg 3 times daily.  But says her cough has not improved.  She denies any fever, discolored mucus, chest pain, orthopnea, hemoptysis.  Allergies  Allergen Reactions   Amlodipine Shortness Of Breath   Bystolic [Nebivolol Hcl]     ANGINA    Diovan [Valsartan]    Metoprolol    Olmesartan    Prednisone Swelling   Trazodone    Bactrim Rash    Flomax [Tamsulosin Hcl] Rash   Sulfa Antibiotics Rash    Immunization History  Administered Date(s) Administered   Influenza,inj,Quad PF,6+ Mos 01/03/2019   Influenza-Unspecified 02/18/2018, 01/05/2019   PFIZER(Purple Top)SARS-COV-2 Vaccination 06/18/2019, 07/18/2019    Past Medical History:  Diagnosis Date   Chronic leukopenia DX  2011   MILD--  HEMATOLOGIST  --  DR Beryle Beams   H/O hiatal hernia    TINY PER EGD 2007   Hematuria    Hemorrhoids    INTERNAL AND EXTERNAL    History of colon polyps    BENIGN   Hyperlipidemia    Hypertension    Ureterocele    LEFT   Wears contact lenses     Tobacco History: Social History   Tobacco Use  Smoking Status Never  Smokeless Tobacco Never   Counseling given: Not Answered   Outpatient Medications Prior to Visit  Medication Sig Dispense Refill   albuterol (VENTOLIN HFA) 108 (90 Base) MCG/ACT inhaler SMARTSIG:2 Puff(s) By Mouth Every 4 Hours PRN     calcium carbonate (OS-CAL) 600 MG TABS Take 600 mg by mouth daily with breakfast.      fluticasone (FLONASE) 50 MCG/ACT nasal spray Place 2 sprays into both nostrils daily.     hydrALAZINE (APRESOLINE) 50 MG tablet TAKE 1 TABLET(50 MG) BY MOUTH THREE TIMES DAILY. 270 tablet 2   losartan (COZAAR) 100 MG tablet Take 1 tablet (100 mg total) by mouth daily. 90 tablet 3  magnesium oxide (MAG-OX) 400 MG tablet Take 400 mg by mouth every evening.      spironolactone (ALDACTONE) 25 MG tablet TAKE 1 TABLET(25 MG) BY MOUTH DAILY 90 tablet 0   zolpidem (AMBIEN) 10 MG tablet Take 1 tablet by mouth at bedtime as needed for sleep.  5   No facility-administered medications prior to visit.     Review of Systems:   Constitutional:   No  weight loss, night sweats,  Fevers, chills, fatigue, or  lassitude.  HEENT:   No headaches,  Difficulty swallowing,  Tooth/dental problems, or  Sore throat,                No sneezing, itching, ear ache,  +nasal congestion, post nasal drip,   CV:  No  chest pain,  Orthopnea, PND, swelling in lower extremities, anasarca, dizziness, palpitations, syncope.   GI  No heartburn, indigestion, abdominal pain, nausea, vomiting, diarrhea, change in bowel habits, loss of appetite, bloody stools.   Resp: .  No chest wall deformity  Skin: no rash or lesions.  GU: no dysuria, change in color of urine, no urgency or frequency.  No flank pain, no hematuria   MS:  No joint pain or swelling.  No decreased range of motion.  No back pain.    Physical Exam  BP 128/82 (BP Location: Left Arm, Patient Position: Sitting, Cuff Size: Normal)   Pulse 81   Temp 98.3 F (36.8 C) (Oral)   Ht 5\' 4"  (1.626 m)   Wt 159 lb 12.8 oz (72.5 kg)   SpO2 100%   BMI 27.43 kg/m   GEN: A/Ox3; pleasant , NAD, well nourished    HEENT:  [place]/AT, NOSE-clear, THROAT-clear, no lesions, no postnasal drip or exudate noted.   NECK:  Supple w/ fair ROM; no JVD; normal carotid impulses w/o bruits; no thyromegaly or nodules palpated; no lymphadenopathy.    RESP  Clear  P & A; w/o, wheezes/ rales/ or rhonchi. no accessory muscle use, no dullness to percussion  CARD:  RRR, no m/r/g, no peripheral edema, pulses intact, no cyanosis or clubbing.  GI:   Soft & nt; nml bowel sounds; no organomegaly or masses detected.   Musco: Warm bil, no deformities or joint swelling noted.   Neuro: alert, no focal deficits noted.    Skin: Warm, no lesions or rashes    Lab Results:   BNP      PFT Results Latest Ref Rng & Units 09/07/2019  FVC-Pre L 2.48  FVC-Predicted Pre % 101  FVC-Post L 2.44  FVC-Predicted Post % 99  Pre FEV1/FVC % % 80  Post FEV1/FCV % % 84  FEV1-Pre L 1.98  FEV1-Predicted Pre % 104  FEV1-Post L 2.06  DLCO uncorrected ml/min/mmHg 14.75  DLCO UNC% % 73  DLCO corrected ml/min/mmHg 15.29  DLCO COR %Predicted % 76  DLVA Predicted % 85  TLC L 4.29  TLC % Predicted % 82  RV % Predicted % 67    Lab Results  Component Value Date   NITRICOXIDE 13 11/04/2015         Assessment & Plan:   Cough variant asthma vs UCAS Patient with flare of her chronic cough.  Most like a post viral cough. Check chest x-ray today.  Continue on cough control and trigger prevention.  We will add in Delsym and Tessalon for cough control Patient is allergic to oral steroids.  Says that she can take Depo Medrol.  Has tolerated this well in the  past.  Plan  Patient Instructions  Chest xray today .  Begin Delsym 2 tsp Twice daily  for cough As needed   Begin Tessalon Three times a day for cough As needed   Continue on Chlor tab Three times a day  As needed   Continue on Prilosec Twice daily   Continue on Flonase Twice daily   Depo Medrol 80mg  IM injection today .  Albuterol Inhaler As needed   Follow up with Dr Vaughan Browner in 6-8 weeks and As needed   Please contact office for sooner follow up if symptoms do not improve or worsen or seek emergency care         I spent  30  minutes dedicated to the care of this patient on the date of this encounter to include pre-visit review of records, face-to-face time with the patient discussing conditions above, post visit ordering of testing, clinical documentation with the electronic health record, making appropriate referrals as documented, and communicating necessary findings to members of the patients care team.    Rexene Edison, NP 01/28/2021

## 2021-01-28 NOTE — Assessment & Plan Note (Signed)
Patient with flare of her chronic cough.  Most like a post viral cough. Check chest x-ray today.  Continue on cough control and trigger prevention.  We will add in Delsym and Tessalon for cough control Patient is allergic to oral steroids.  Says that she can take Depo Medrol.  Has tolerated this well in the past.  Plan  Patient Instructions  Chest xray today .  Begin Delsym 2 tsp Twice daily  for cough As needed   Begin Tessalon Three times a day for cough As needed   Continue on Chlor tab Three times a day  As needed   Continue on Prilosec Twice daily   Continue on Flonase Twice daily   Depo Medrol 80mg  IM injection today .  Albuterol Inhaler As needed   Follow up with Dr Vaughan Browner in 6-8 weeks and As needed   Please contact office for sooner follow up if symptoms do not improve or worsen or seek emergency care

## 2021-01-28 NOTE — Patient Instructions (Addendum)
Chest xray today .  Begin Delsym 2 tsp Twice daily  for cough As needed   Begin Tessalon Three times a day for cough As needed   Continue on Chlor tab Three times a day  As needed   Continue on Prilosec Twice daily   Continue on Flonase Twice daily   Depo Medrol 80mg  IM injection today .  Albuterol Inhaler As needed   Follow up with Dr Vaughan Browner in 6-8 weeks and As needed   Please contact office for sooner follow up if symptoms do not improve or worsen or seek emergency care

## 2021-01-30 DIAGNOSIS — Z8262 Family history of osteoporosis: Secondary | ICD-10-CM | POA: Diagnosis not present

## 2021-01-30 DIAGNOSIS — N958 Other specified menopausal and perimenopausal disorders: Secondary | ICD-10-CM | POA: Diagnosis not present

## 2021-01-30 DIAGNOSIS — M816 Localized osteoporosis [Lequesne]: Secondary | ICD-10-CM | POA: Diagnosis not present

## 2021-01-30 DIAGNOSIS — R29890 Loss of height: Secondary | ICD-10-CM | POA: Diagnosis not present

## 2021-01-30 DIAGNOSIS — Z6827 Body mass index (BMI) 27.0-27.9, adult: Secondary | ICD-10-CM | POA: Diagnosis not present

## 2021-01-30 DIAGNOSIS — Z01419 Encounter for gynecological examination (general) (routine) without abnormal findings: Secondary | ICD-10-CM | POA: Diagnosis not present

## 2021-02-02 NOTE — Progress Notes (Signed)
Cardiology Office Note   Date:  02/03/2021   ID:  Caitlin Walker, DOB 1946/02/29, MRN [id number]  PCP:  Lawerance Cruel, MD  Cardiologist:   Mertie Moores, MD   Chief Complaint  Patient presents with   Hypertension    1. Hypertension 2. Insomnia   Caitlin Walker is a 75 year old female with a history of hypertension. She has done very well. She's not having episodes of chest pain or shortness of breath. She avoids eating any extra salt.  She exercises several times a week.  She is having trouble with insomnia.  May 12, 2012: She has been doing well from a cardiac standpoint.  She has had some lightheadness.  She also has had some leg cramps.  She has not been exercising.  She has been watching her salt intake.   She still works full time in the [employer].  Oct. 1, 2014:  Caitlin Walker is doing ok.  Still has leg cramps.  She has been busy - her was diagnosed with breast cancer.  She is running around lots .  No CP or dyspnea.  Has occasional indigestion.   She works in the [employer].    Nov. 12, 2014 She is doing ok  Sep 18, 2013:  Nov. 9, 2015:  Caitlin Walker is doing well.    She has retired.   Has developed a neuroma on the bottom of her foot.  BP has been a little higher.   Sep 17, 2014:   Caitlin Walker is a 75 y.o. female who presents for HTN She is doing well. No CP,  BP has been well controlled.   Nov. 14, 2016:  Doing well.  Has had some GI bleeding - has not found the source yet.  Has had upper ECG.  CT , barium swallow .  Lipids have improved significantly   Oct 01, 2015: Caitlin Walker is seen back today for eval of her HTN.  Was diagnosed with Purtussis in Feb.   Still coughing  BP is well controlled.  Remains active   03/30/2016:  Has been a rough year.  Is separated from her husband . Has had lots of anxiety  BP has been well controlled.  Occasional , rare episodes of CP   April 05, 2017:  Doing well from a cardiac standpoint .    BP has been well  controled.    Just did a 6 night stretch of work  ( 52 hours )  Feels well   April 11, 2018:  Caitlin Walker is seen today for follow-up of her hypertension.  Her blood pressure is mildly elevated today. Has not tolerated amlodipine, vasotec, Norvasc, bystolic,  Valsartan   December 04, 2019:  Caitlin Walker is seen today for follow-up of her hypertension. BP is a bit elevated.  Tries to avoid salty foods.     Had hemmorhoid surgery in Feb.   July 08, 2020: Caitlin Walker is seen today for follow up of her HTN BP looks great today  We have recently added Spironolactone and increased hydralazine   Oct. 3, 2022 Caitlin Walker is seen for follow up of her HTN Having lots of leg cramps  Has tried mustard and also  tonic water.  BP at home are mildly elevated.  Had covid in July .  Breathing is better . Walks 3 miles, 3 times a week   Past Medical History:  Diagnosis Date   Chronic leukopenia DX  2011   MILD--  HEMATOLOGIST  --  DR Beryle Beams  H/O hiatal hernia    TINY PER EGD 2007   Hematuria    Hemorrhoids    INTERNAL AND EXTERNAL    History of colon polyps    BENIGN   Hyperlipidemia    Hypertension    Ureterocele    LEFT   Wears contact lenses     Past Surgical History:  Procedure Laterality Date   APPENDECTOMY  1989   BREAST BIOPSY Left    benign   COLONOSCOPY N/A 08/01/2012   Procedure: COLONOSCOPY;  Surgeon: Jeryl Columbia, MD;  Location: [facility];  Service: Endoscopy;  Laterality: N/A;  pt. would like dan to do procedure   CYSTO/  URETHRAL DILATION /  RIGHT RETROGRADE PYELOGRAM/ RIGHT URETEROSCOPY/  INSTILLATION THERAPY  06-18-2008   CYSTOSCOPY/RETROGRADE/URETEROSCOPY Left 10/26/2013   Procedure: CYSTOSCOPY/RETROGRADE/DIAGNOSTIC DIGITAL URETEROSCOPY WITH LEFT URETERAL STENT PLACEMENT.;  Surgeon: Alexis Frock, MD;  Location[facility];  Service: Urology;  Laterality: Left;   DILATION AND CURETTAGE OF UTERUS  2001   SUPRACERVICAL ABDOMINAL HYSTERECTOMY  02-25-2000    W/   BILATERAL SALPINGOOPHORECTOMY AND RECTOCELE REPAIR     Current Outpatient Medications  Medication Sig Dispense Refill   albuterol (VENTOLIN HFA) 108 (90 Base) MCG/ACT inhaler SMARTSIG:2 Puff(s) By Mouth Every 4 Hours PRN     benzonatate (TESSALON) 200 MG capsule Take 1 capsule (200 mg total) by mouth 3 (three) times daily as needed for cough. 30 capsule 1   calcium carbonate (OS-CAL) 600 MG TABS Take 600 mg by mouth daily with breakfast.      fluticasone (FLONASE) 50 MCG/ACT nasal spray Place 2 sprays into both nostrils daily.     hydrALAZINE (APRESOLINE) 50 MG tablet TAKE 1 TABLET(50 MG) BY MOUTH THREE TIMES DAILY. 270 tablet 2   losartan (COZAAR) 100 MG tablet Take 1 tablet (100 mg total) by mouth daily. 90 tablet 3   magnesium oxide (MAG-OX) 400 MG tablet Take 400 mg by mouth every evening.      spironolactone (ALDACTONE) 25 MG tablet TAKE 1 TABLET(25 MG) BY MOUTH DAILY 90 tablet 0   zolpidem (AMBIEN) 10 MG tablet Take 1 tablet by mouth at bedtime as needed for sleep.  5   No current facility-administered medications for this visit.    Allergies:   Amlodipine, Bystolic [nebivolol hcl], Diovan [valsartan], Metoprolol, Olmesartan, Prednisone, Trazodone, Bactrim, Flomax [tamsulosin hcl], and Sulfa antibiotics    Social History:  The patient  reports that she has never smoked. She has never used smokeless tobacco. She reports that she does not drink alcohol and does not use drugs.   Family History:  The patient's family history includes Colon cancer in her sister; Emphysema in her father; Heart disease in her father; Hyperlipidemia in her brother, sister, and sister; Hypertension in her brother, brother, brother, father, sister, sister, sister, sister, and sister.    ROS: As noted in the current history.  Otherwise the review of systems is negative.  Physical Exam: Blood pressure (!) 148/90, pulse 69, height 5\' 4"  (1.626 m), weight 157 lb 3.2 oz (71.3 kg), SpO2 99 %.  GEN:   Well nourished, well developed in no acute distress HEENT: Normal NECK: No JVD; No carotid bruits LYMPHATICS: No lymphadenopathy CARDIAC: RRR , no murmurs, rubs, gallops RESPIRATORY:  Clear to auscultation without rales, wheezing or rhonchi  ABDOMEN: Soft, non-tender, non-distended MUSCULOSKELETAL:  No edema; No deformity  SKIN: Warm and dry NEUROLOGIC:  Alert and oriented x 3  EKG: February 03, 2021: Normal sinus rhythm  at 24.  No ST or T wave changes.   Recent Labs: 01/22/2021: BUN 10; Creatinine, Ser 0.67; Potassium 4.4; Sodium 138    Lipid Panel    Component Value Date/Time   CHOL 187 03/15/2015 1219   TRIG 83 03/15/2015 1219   HDL 89 03/15/2015 1219   CHOLHDL 2.1 03/15/2015 1219   VLDL 17 03/15/2015 1219   LDLCALC 81 03/15/2015 1219      Wt Readings from Last 3 Encounters:  02/03/21 157 lb 3.2 oz (71.3 kg)  01/28/21 159 lb 12.8 oz (72.5 kg)  07/08/20 157 lb 9.6 oz (71.5 kg)      Other studies Reviewed: Additional studies/ records that were reviewed today include: . Review of the above records demonstrates:    ASSESSMENT AND PLAN:  1.  Essential hypertension:      BP is mildly elevated.   She is having leg and foot cramps so she might be volume depleted. I'm not sure we can increase the spironolactone at this point   Continue current meds Try mustard or quinine water for her leg cramps        Labs/ tests ordered today include:  Orders Placed This Encounter  Procedures   Magnesium   EKG 12-Lead   ECHOCARDIOGRAM COMPLETE       Mertie Moores, MD  02/03/2021 8:40 PM    [facility] [address], [place], [place]  [address] Phone: [phone]; Fax: [phone]

## 2021-02-03 ENCOUNTER — Ambulatory Visit: Payer: PPO | Admitting: Cardiovascular Disease

## 2021-02-03 ENCOUNTER — Encounter: Payer: Self-pay | Admitting: Cardiovascular Disease

## 2021-02-03 ENCOUNTER — Other Ambulatory Visit: Payer: Self-pay

## 2021-02-03 VITALS — BP 148/90 | HR 69 | Ht 64.0 in | Wt 157.2 lb

## 2021-02-03 DIAGNOSIS — R0602 Shortness of breath: Secondary | ICD-10-CM

## 2021-02-03 DIAGNOSIS — I10 Essential (primary) hypertension: Secondary | ICD-10-CM | POA: Diagnosis not present

## 2021-02-03 NOTE — Patient Instructions (Signed)
Medication Instructions:  Your physician recommends that you continue on your current medications as directed. Please refer to the Current Medication list given to you today.  *If you need a refill on your cardiac medications before your next appointment, please call your pharmacy*   Lab Work: TODAY: Mg If you have labs (blood work) drawn today and your tests are completely normal, you will receive your results only by: [place] (if you have MyChart) OR A paper copy in the mail If you have any lab test that is abnormal or we need to change your treatment, we will call you to review the results.   Testing/Procedures: Your physician has requested that you have an echocardiogram. Echocardiography is a painless test that uses sound waves to create images of your heart. It provides your doctor with information about the size and shape of your heart and how well your heart's chambers and valves are working. This procedure takes approximately one hour. There are no restrictions for this procedure.    Follow-Up: At [facility], you and your health needs are our priority.  As part of our continuing mission to provide you with exceptional heart care, we have created designated Provider Care Teams.  These Care Teams include your primary Cardiologist (physician) and Advanced Practice Providers (APPs -  Physician Assistants and Nurse Practitioners) who all work together to provide you with the care you need, when you need it.  We recommend signing up for the patient portal called "MyChart".  Sign up information is provided on this After Visit Summary.  MyChart is used to connect with patients for Virtual Visits (Telemedicine).  Patients are able to view lab/test results, encounter notes, upcoming appointments, etc.  Non-urgent messages can be sent to your provider as well.   To learn more about what you can do with MyChart, go to [email or web address].    Your next appointment:   6  month(s)  The format for your next appointment:   In Person  Provider:   You will see one of the following Advanced Practice Providers on your designated Care Team:   Richardson Dopp, PA-C [place], [place]

## 2021-02-04 LAB — MAGNESIUM: Magnesium: 1.8 mg/dL (ref 1.6–2.3)

## 2021-02-05 ENCOUNTER — Telehealth: Payer: Self-pay | Admitting: Nurse Practitioner

## 2021-02-05 MED ORDER — MAGNESIUM OXIDE 400 MG PO CAPS: 400.0000 mg | ORAL_CAPSULE | Freq: Two times a day (BID) | ORAL | 0 refills | Status: AC

## 2021-02-05 NOTE — Telephone Encounter (Signed)
-----   Message from Thayer Headings, MD sent at 02/04/2021  5:16 PM EDT ----- Mag level is a little low.  She is having leg cramps Have her increase her mag suppliment to BID

## 2021-02-05 NOTE — Telephone Encounter (Signed)
Results and review have been reviewed by patient via [place]. Medication list updated.

## 2021-02-19 ENCOUNTER — Other Ambulatory Visit: Payer: Self-pay | Admitting: Cardiovascular Disease

## 2021-02-20 ENCOUNTER — Ambulatory Visit (HOSPITAL_COMMUNITY): Payer: PPO | Attending: Internal Medicine

## 2021-02-20 ENCOUNTER — Other Ambulatory Visit: Payer: Self-pay

## 2021-02-20 DIAGNOSIS — I10 Essential (primary) hypertension: Secondary | ICD-10-CM | POA: Insufficient documentation

## 2021-02-20 DIAGNOSIS — R0602 Shortness of breath: Secondary | ICD-10-CM | POA: Insufficient documentation

## 2021-02-20 LAB — ECHOCARDIOGRAM COMPLETE
Area-P 1/2: 4.21 cm2
S' Lateral: 2.2 cm

## 2021-02-21 ENCOUNTER — Telehealth: Payer: Self-pay

## 2021-02-21 NOTE — Telephone Encounter (Signed)
Spoke with pt and advised per Dr Acie Fredrickson:  Overall, the echo looks good  Normal LV systolic function . Grade 1 diastolic dysfunctoin  Trivial valvular disease   Pt verbalizes understanding and thanked Therapist, sports

## 2021-02-21 NOTE — Telephone Encounter (Signed)
-----   Message from Thayer Headings, MD sent at 02/21/2021 11:15 AM EDT ----- Overall, the echo looks good  Normal LV systolic function . Grade 1 diastolic dysfunctoin  Trivial valvular disease

## 2021-02-21 NOTE — Telephone Encounter (Signed)
Attempted phone call to pt and left voicemail message to contact office at [phone]. Advised echo results have been released to her [place] with Dr Elmarie Shiley comments.

## 2021-03-07 ENCOUNTER — Telehealth: Payer: Self-pay | Admitting: Cardiovascular Disease

## 2021-03-07 NOTE — Telephone Encounter (Signed)
Pt called to report that she is still having her bilateral leg cramps mostly at night and she is unable to sleep... she has not had any improvement since increasing her MAG 02/05/21.   She denies claudication, no edema, no color changes..   She says she hydrates well during the day.   I advised her that I will forward to Dr. Acie Fredrickson.. she is not planning to see her PCP Dr. Melinda Crutch again until 07/2021.

## 2021-03-07 NOTE — Telephone Encounter (Signed)
Ronne is calling stating the nurse advised her to drink tonic water and take mustard for her leg cramps, but they are still occurring. She has had three episode this week, Monday, Wednesday, and last night. Please advise.

## 2021-03-10 NOTE — Telephone Encounter (Signed)
Spoke with pt and reviewed Dr Elmarie Shiley recommendations as below.  Pt verbalizes understanding and agrees with current plan.  Pt thanked Therapist, sports for the call.  Nahser, Wonda Cheng, MD  P [facility] Triage Caller: Unspecified (3 days ago,  2:16 PM) She is not on any cardiac mediations that should be causing leg cramps.  This is a general medical issue   While she is waiting to hear back from her general medical doctor,  She should try   - a teaspoon ful of yellow mustard at night  - glass of diet tonic water ( has quinine)  Add potassium to her diet ( oranges, bananas, blue berries)   PN

## 2021-03-11 ENCOUNTER — Ambulatory Visit: Payer: PPO | Admitting: Pulmonary Disease

## 2021-03-11 ENCOUNTER — Encounter: Payer: Self-pay | Admitting: Pulmonary Disease

## 2021-03-11 ENCOUNTER — Other Ambulatory Visit: Payer: Self-pay

## 2021-03-11 VITALS — BP 118/76 | HR 76 | Temp 98.5°F | Ht 64.0 in | Wt 160.4 lb

## 2021-03-11 DIAGNOSIS — R053 Chronic cough: Secondary | ICD-10-CM | POA: Diagnosis not present

## 2021-03-11 MED ORDER — BUDESONIDE-FORMOTEROL FUMARATE 160-4.5 MCG/ACT IN AERO: 2.0000 | INHALATION_SPRAY | Freq: Two times a day (BID) | RESPIRATORY_TRACT | 6 refills | Status: DC

## 2021-03-11 NOTE — Patient Instructions (Signed)
Glad the cough is better We will start you on Symbicort to see if that helps Continue chlorpheniramine and nasal spray for now Return to clinic in 3 months

## 2021-03-11 NOTE — Progress Notes (Signed)
Caitlin Walker    [id number]    [birth year]  Primary Care Physician:Ross, Dwyane Luo, MD  Referring Physician: Lawerance Cruel, MD [place],  [place] [address]  Chief complaint: Follow up for chronic cough  HPI: 75 year old with history of hypertension, hyperlipidemia Developed chronic nonproductive cough in nov 2020.  Cough is nonproductive in nature, paroxysmal, no associated fevers or chills. The episodes of cough have precipitated a pulled chest muscle, hemorrhoids and conjunctival hemorrhage.  Treated with amoxicillin, Depo Medrol injection, Tessalon, Prilosec.  She had a chest x-ray at urgent care around November 2020 and was told it was normal  She started taking Zyrtec with Flonase few weeks ago and reports that the cough has improved. Previously seen in pulmonary clinic by Dr. Melvyn Novas in 2017 for similar complaints.  She reports recurrence of these episodes about 1 time every year.  She is okay in the interim  Pets: No pets Occupation: Retired Licensed conveyancer Exposures: No known exposures.  No mold, hot tub, Jacuzzi or no down pillows or comforters Smoking history: Never smoker Travel history: Recently lived in [place].  No significant recent travel Relevant family history: Father died of emphysema.  He was a smoker  Interim history: Seen back in clinic in September 2023 for recurrence of cough after she developed mild COVID-19 infection Restarted Prilosec, Flonase and chlorpheniramine with marked improvement She was also given prednisone taper Chest x-ray in September did not show any acute abnormality  She tried to wean herself off chlorpheniramine the past few weeks but her cough returned.  Outpatient Encounter Medications as of 03/11/2021  Medication Sig   albuterol (VENTOLIN HFA) 108 (90 Base) MCG/ACT inhaler SMARTSIG:2 Puff(s) By Mouth Every 4 Hours PRN   benzonatate (TESSALON) 200 MG capsule Take 1 capsule (200 mg total) by mouth 3  (three) times daily as needed for cough.   calcium carbonate (OS-CAL) 600 MG TABS Take 600 mg by mouth daily with breakfast.    fluticasone (FLONASE) 50 MCG/ACT nasal spray Place 2 sprays into both nostrils daily.   hydrALAZINE (APRESOLINE) 50 MG tablet TAKE 1 TABLET(50 MG) BY MOUTH THREE TIMES DAILY.   losartan (COZAAR) 100 MG tablet Take 1 tablet (100 mg total) by mouth daily.   Magnesium Oxide 400 MG CAPS Take 1 capsule (400 mg total) by mouth in the morning and at bedtime.   spironolactone (ALDACTONE) 25 MG tablet TAKE 1 TABLET(25 MG) BY MOUTH DAILY   zolpidem (AMBIEN) 10 MG tablet Take 1 tablet by mouth at bedtime as needed for sleep.   No facility-administered encounter medications on file as of 03/11/2021.   Physical Exam: Blood pressure 118/76, pulse 76, temperature 98.5 F (36.9 C), temperature source Oral, height 5\' 4"  (1.626 m), weight 160 lb 6.4 oz (72.8 kg), SpO2 100 %. Gen:      No acute distress HEENT:  EOMI, sclera anicteric Neck:     No masses; no thyromegaly Lungs:    Clear to auscultation bilaterally; normal respiratory effort CV:         Regular rate and rhythm; no murmurs Abd:      + bowel sounds; soft, non-tender; no palpable masses, no distension Ext:    No edema; adequate peripheral perfusion Skin:      Warm and dry; no rash Neuro: alert and oriented x 3 Psych: normal mood and affect   Data Reviewed: Imaging: CTA 10/15/2017-no pulmonary embolism, mild lower lobe subsegmental atelectasis. Chest  x-ray 06/28/2019-no acute process. Chest x-ray 01/28/2021-low lung volumes, clear lungs I have reviewed the images personally.  PFTs: FVC 2.44 [9 9%], FEV1 2.06 [99%], F/F 84, TLC 4.29 [82%], DLCO 14.75 [73%] Minimal diffusion defect  Labs: CBC 06/28/2019-WBC 4.2, eos 4%, absolute eosinophil count 168 IgE 06/28/2019-241  Assessment:  Chronic cough Likely secondary to upper airway cough GERD May have a component of reactive airway disease after recent COVID-19  infection, especially as she responded to steroid taper No evidence of interstitial lung abnormalities on chest x-ray  Advised to continue steroid nasal spray, can use chlorpheniramine 8 mg 3 times daily to reduce postnasal drip Continue PPI Start Symbicort   Plan/Recommendations: - Steroid nasal spray, chlorphentermine - Prilosec - Symbicort  Follow-up in 3 months  Marshell Garfinkel MD [facility] 03/11/2021, 11:26 AM  CC: Lawerance Cruel, MD

## 2021-03-12 ENCOUNTER — Other Ambulatory Visit: Payer: PPO

## 2021-04-04 DIAGNOSIS — M7732 Calcaneal spur, left foot: Secondary | ICD-10-CM | POA: Diagnosis not present

## 2021-04-04 DIAGNOSIS — M25572 Pain in left ankle and joints of left foot: Secondary | ICD-10-CM | POA: Diagnosis not present

## 2021-04-17 DIAGNOSIS — J029 Acute pharyngitis, unspecified: Secondary | ICD-10-CM | POA: Diagnosis not present

## 2021-04-17 DIAGNOSIS — U071 COVID-19: Secondary | ICD-10-CM | POA: Diagnosis not present

## 2021-04-17 DIAGNOSIS — Z6827 Body mass index (BMI) 27.0-27.9, adult: Secondary | ICD-10-CM | POA: Diagnosis not present

## 2021-04-17 DIAGNOSIS — M62838 Other muscle spasm: Secondary | ICD-10-CM | POA: Diagnosis not present

## 2021-05-17 ENCOUNTER — Other Ambulatory Visit: Payer: Self-pay | Admitting: Cardiovascular Disease

## 2021-06-13 ENCOUNTER — Ambulatory Visit: Payer: PPO | Admitting: Pulmonary Disease

## 2021-06-28 ENCOUNTER — Other Ambulatory Visit: Payer: Self-pay | Admitting: Physician Assistant

## 2021-07-14 ENCOUNTER — Ambulatory Visit: Payer: PPO | Admitting: Pulmonary Disease

## 2021-07-15 DIAGNOSIS — I10 Essential (primary) hypertension: Secondary | ICD-10-CM | POA: Diagnosis not present

## 2021-07-15 DIAGNOSIS — E7800 Pure hypercholesterolemia, unspecified: Secondary | ICD-10-CM | POA: Diagnosis not present

## 2021-07-15 DIAGNOSIS — G4700 Insomnia, unspecified: Secondary | ICD-10-CM | POA: Diagnosis not present

## 2021-07-15 DIAGNOSIS — D72819 Decreased white blood cell count, unspecified: Secondary | ICD-10-CM | POA: Diagnosis not present

## 2021-07-15 DIAGNOSIS — Z862 Personal history of diseases of the blood and blood-forming organs and certain disorders involving the immune mechanism: Secondary | ICD-10-CM | POA: Diagnosis not present

## 2021-07-15 DIAGNOSIS — Z0000 Encounter for general adult medical examination without abnormal findings: Secondary | ICD-10-CM | POA: Diagnosis not present

## 2021-07-24 ENCOUNTER — Ambulatory Visit: Payer: PPO | Admitting: Pulmonary Disease

## 2021-07-24 ENCOUNTER — Other Ambulatory Visit: Payer: Self-pay

## 2021-07-24 ENCOUNTER — Encounter: Payer: Self-pay | Admitting: Pulmonary Disease

## 2021-07-24 VITALS — BP 134/86 | HR 80 | Temp 98.3°F | Ht 64.0 in | Wt 161.2 lb

## 2021-07-24 DIAGNOSIS — R053 Chronic cough: Secondary | ICD-10-CM | POA: Diagnosis not present

## 2021-07-24 NOTE — Progress Notes (Signed)
? ?      Caitlin Walker    [id number]    [birth year] ? ?Primary Care Physician:Ross, Dwyane Luo, MD ? ?Referring Physician: Lawerance Cruel, MD ?[address] ?[place],  [place] [address] ? ?Chief complaint: Follow up for chronic cough ? ?HPI: ?76 year old with history of hypertension, hyperlipidemia ?Developed chronic nonproductive cough in nov 2020.  Cough is nonproductive in nature, paroxysmal, no associated fevers or chills. ?The episodes of cough have precipitated a pulled chest muscle, hemorrhoids and conjunctival hemorrhage.  Treated with amoxicillin, Depo Medrol injection, Tessalon, Prilosec.  She had a chest x-ray at urgent care around November 2020 and was told it was normal ? ?She started taking Zyrtec with Flonase few weeks ago and reports that the cough has improved. ?Previously seen in pulmonary clinic by Dr. Melvyn Novas in 2017 for similar complaints.  She reports recurrence of these episodes about 1 time every year.  She is okay in the interim ? ?Seen back in clinic in September 2023 for recurrence of cough after she developed mild COVID-19 infection ?Restarted Prilosec, Flonase and chlorpheniramine with marked improvement ?She was also given prednisone taper ?Chest x-ray in September did not show any acute abnormality ? ?Pets: No pets ?Occupation: Retired Licensed conveyancer ?Exposures: No known exposures.  No mold, hot tub, Jacuzzi or no down pillows or comforters ?Smoking history: Never smoker ?Travel history: Recently lived in [place].  No significant recent travel ?Relevant family history: Father died of emphysema.  He was a smoker ? ?Interim history: ?Cough is improved.  No new complaints ?She developed another round of COVID infection in December 2022 which was treated with Paxlovid ? ?Given Symbicort at last visit but stopped using as it caused side effects of hoarseness ? ?Outpatient Encounter Medications as of 07/24/2021  ?Medication Sig  ? albuterol (VENTOLIN HFA) 108 (90 Base) MCG/ACT  inhaler SMARTSIG:2 Puff(s) By Mouth Every 4 Hours PRN  ? budesonide-formoterol (SYMBICORT) 160-4.5 MCG/ACT inhaler Inhale 2 puffs into the lungs in the morning and at bedtime.  ? calcium carbonate (OS-CAL) 600 MG TABS Take 600 mg by mouth daily with breakfast.   ? fluticasone (FLONASE) 50 MCG/ACT nasal spray Place 2 sprays into both nostrils daily.  ? hydrALAZINE (APRESOLINE) 50 MG tablet TAKE 1 TABLET(50 MG) BY MOUTH THREE TIMES DAILY  ? losartan (COZAAR) 100 MG tablet Take 1 tablet (100 mg total) by mouth daily.  ? Magnesium Oxide 400 MG CAPS Take 1 capsule (400 mg total) by mouth in the morning and at bedtime.  ? spironolactone (ALDACTONE) 25 MG tablet TAKE 1 TABLET(25 MG) BY MOUTH DAILY  ? zolpidem (AMBIEN) 10 MG tablet Take 1 tablet by mouth at bedtime as needed for sleep.  ? benzonatate (TESSALON) 200 MG capsule Take 1 capsule (200 mg total) by mouth 3 (three) times daily as needed for cough.  ? ?No facility-administered encounter medications on file as of 07/24/2021.  ? ?Physical Exam: ?Blood pressure 134/86, pulse 80, temperature 98.3 ?F (36.8 ?C), temperature source Oral, height '5\' 4"'$  (1.626 m), weight 161 lb 3.2 oz (73.1 kg), SpO2 100 %. ?Gen:      No acute distress ?HEENT:  EOMI, sclera anicteric ?Neck:     No masses; no thyromegaly ?Lungs:    Clear to auscultation bilaterally; normal respiratory effort ?CV:         Regular rate and rhythm; no murmurs ?Abd:      + bowel sounds; soft, non-tender; no palpable masses, no distension ?Ext:  No edema; adequate peripheral perfusion ?Skin:      Warm and dry; no rash ?Neuro: alert and oriented x 3 ?Psych: normal mood and affect  ? ?Data Reviewed: ?Imaging: ?CTA 10/15/2017-no pulmonary embolism, mild lower lobe subsegmental atelectasis. ?Chest x-ray 06/28/2019-no acute process. ?Chest x-ray 01/28/2021-low lung volumes, clear lungs ?I have reviewed the images personally. ? ?PFTs: ?FVC 2.44 [9 9%], FEV1 2.06 [99%], F/F 84, TLC 4.29 [82%], DLCO 14.75 [73%] ?Minimal  diffusion defect ? ?Labs: ?CBC 06/28/2019-WBC 4.2, eos 4%, absolute eosinophil count 168 ?IgE 06/28/2019-241 ? ?Assessment:  ?Chronic cough ?Likely secondary to upper airway cough GERD ?May have a component of reactive airway disease after recent COVID-19 infection, especially as she responded to steroid taper ?No evidence of interstitial lung abnormalities on chest x-ray ? ?She uses steroid nasal spray, and chlorpheniramine as needed.  Overall she is improved with only intermittent symptoms. ?PPI for GERD ? ?Plan/Recommendations: ?- Steroid nasal spray, chlorphentermine ?- Prilosec ? ?Follow-up as needed. ? ?Marshell Garfinkel MD ?[facility] ?07/24/2021, 11:18 AM ? ?CC: Lawerance Cruel, MD ? ? ?

## 2021-10-20 DIAGNOSIS — Z87442 Personal history of urinary calculi: Secondary | ICD-10-CM | POA: Diagnosis not present

## 2021-10-20 DIAGNOSIS — R350 Frequency of micturition: Secondary | ICD-10-CM | POA: Diagnosis not present

## 2021-10-20 DIAGNOSIS — R1084 Generalized abdominal pain: Secondary | ICD-10-CM | POA: Diagnosis not present

## 2021-11-11 ENCOUNTER — Other Ambulatory Visit: Payer: Self-pay | Admitting: Cardiovascular Disease

## 2021-11-12 DIAGNOSIS — N3000 Acute cystitis without hematuria: Secondary | ICD-10-CM | POA: Diagnosis not present

## 2021-11-12 DIAGNOSIS — R300 Dysuria: Secondary | ICD-10-CM | POA: Diagnosis not present

## 2021-11-13 ENCOUNTER — Ambulatory Visit: Payer: PPO | Admitting: Physician Assistant

## 2021-11-13 ENCOUNTER — Encounter: Payer: Self-pay | Admitting: Physician Assistant

## 2021-11-13 VITALS — BP 140/80 | HR 77 | Ht 64.0 in | Wt 162.0 lb

## 2021-11-13 DIAGNOSIS — E782 Mixed hyperlipidemia: Secondary | ICD-10-CM

## 2021-11-13 DIAGNOSIS — I10 Essential (primary) hypertension: Secondary | ICD-10-CM

## 2021-11-13 DIAGNOSIS — N760 Acute vaginitis: Secondary | ICD-10-CM | POA: Diagnosis not present

## 2021-11-13 DIAGNOSIS — R002 Palpitations: Secondary | ICD-10-CM

## 2021-11-13 DIAGNOSIS — R102 Pelvic and perineal pain: Secondary | ICD-10-CM | POA: Diagnosis not present

## 2021-11-13 NOTE — Patient Instructions (Signed)
Medication Instructions:  Your physician recommends that you continue on your current medications as directed. Please refer to the Current Medication list given to you today. *If you need a refill on your cardiac medications before your next appointment, please call your pharmacy*   Lab Work: None Ordered If you have labs (blood work) drawn today and your tests are completely normal, you will receive your results only by: [place] (if you have MyChart) OR A paper copy in the mail If you have any lab test that is abnormal or we need to change your treatment, we will call you to review the results.   Testing/Procedures: None Ordered   Follow-Up: At [facility], you and your health needs are our priority.  As part of our continuing mission to provide you with exceptional heart care, we have created designated Provider Care Teams.  These Care Teams include your primary Cardiologist (physician) and Advanced Practice Providers (APPs -  Physician Assistants and Nurse Practitioners) who all work together to provide you with the care you need, when you need it.  We recommend signing up for the patient portal called "MyChart".  Sign up information is provided on this After Visit Summary.  MyChart is used to connect with patients for Virtual Visits (Telemedicine).  Patients are able to view lab/test results, encounter notes, upcoming appointments, etc.  Non-urgent messages can be sent to your provider as well.   To learn more about what you can do with MyChart, go to [email or web address].    Your next appointment:   1 year(s)  The format for your next appointment:   In Person  Provider:   Mertie Moores, MD    Other Instructions   Important Information About Sugar

## 2021-11-13 NOTE — Progress Notes (Signed)
Cardiology Office Note:    Date:  11/13/2021   ID:  Caitlin Walker, DOB 07-06-1945, MRN [id number]  PCP:  Lawerance Cruel, MD  [facility] Cardiologist:  Mertie Moores, MD  [facility] Electrophysiologist:  None   Chief Complaint: 9 months follow up   History of Present Illness:    Caitlin Walker is a 76 y.o. female with a hx of hypertension and hyperlipidemia presents for follow-up.  Echo 02/2021: LVEF 60%, grade 1 DD.  Patient is here for follow-up.  She lost her nephew last month who was battling with dialysis.  She is still grieving.  She was primary caregiver.  She has reported intermittent palpitation with laying down only.  She holds her breath and symptoms resolved.  Occurring once every 3 to 4 months.  No associated symptoms.  She denies chest pain, shortness of breath, orthopnea, PND, dizziness, lower extremity edema or melena.  Intermittent elevated blood pressure due to noncompliance with diet.   Past Medical History:  Diagnosis Date   Chronic leukopenia DX  2011   MILD--  HEMATOLOGIST  --  DR Beryle Beams   H/O hiatal hernia    TINY PER EGD 2007   Hematuria    Hemorrhoids    INTERNAL AND EXTERNAL    History of colon polyps    BENIGN   Hyperlipidemia    Hypertension    Ureterocele    LEFT   Wears contact lenses     Past Surgical History:  Procedure Laterality Date   APPENDECTOMY  1989   BREAST BIOPSY Left    benign   COLONOSCOPY N/A 08/01/2012   Procedure: COLONOSCOPY;  Surgeon: Jeryl Columbia, MD;  Location: [facility];  Service: Endoscopy;  Laterality: N/A;  pt. would like dan to do procedure   CYSTO/  URETHRAL DILATION /  RIGHT RETROGRADE PYELOGRAM/ RIGHT URETEROSCOPY/  INSTILLATION THERAPY  06-18-2008   CYSTOSCOPY/RETROGRADE/URETEROSCOPY Left 10/26/2013   Procedure: CYSTOSCOPY/RETROGRADE/DIAGNOSTIC DIGITAL URETEROSCOPY WITH LEFT URETERAL STENT PLACEMENT.;  Surgeon: Alexis Frock, MD;  Location: [facility];  Service: Urology;   Laterality: Left;   DILATION AND CURETTAGE OF UTERUS  2001   SUPRACERVICAL ABDOMINAL HYSTERECTOMY  02-25-2000   W/   BILATERAL SALPINGOOPHORECTOMY AND RECTOCELE REPAIR    Current Medications: Current Meds  Medication Sig   albuterol (VENTOLIN HFA) 108 (90 Base) MCG/ACT inhaler SMARTSIG:2 Puff(s) By Mouth Every 4 Hours PRN   calcium carbonate (OS-CAL) 600 MG TABS Take 600 mg by mouth daily with breakfast.    chlorpheniramine (CHLOR-TRIMETON) 4 MG tablet as needed for allergies.   fluticasone (FLONASE) 50 MCG/ACT nasal spray Place 2 sprays into both nostrils daily.   hydrALAZINE (APRESOLINE) 50 MG tablet TAKE 1 TABLET(50 MG) BY MOUTH THREE TIMES DAILY   losartan (COZAAR) 100 MG tablet Take 1 tablet (100 mg total) by mouth daily.   Magnesium Oxide 400 MG CAPS Take 1 capsule (400 mg total) by mouth in the morning and at bedtime.   nitrofurantoin, macrocrystal-monohydrate, (MACROBID) 100 MG capsule Take 100 mg by mouth 2 (two) times daily.   omeprazole (PRILOSEC) 20 MG capsule daily.   spironolactone (ALDACTONE) 25 MG tablet TAKE 1 TABLET(25 MG) BY MOUTH DAILY   zolpidem (AMBIEN) 10 MG tablet Take 1 tablet by mouth at bedtime as needed for sleep.     Allergies:   Amlodipine, Bystolic [nebivolol hcl], Diovan [valsartan], Ergocalciferol, Metoprolol, Olmesartan, Prednisone, Sulfamethoxazole-trimethoprim, Trazodone, Bactrim, Flomax [tamsulosin hcl], and Sulfa antibiotics   Social History   Socioeconomic History  Marital status: Divorced    Spouse name: Not on file   Number of children: Not on file   Years of education: Not on file   Highest education level: Not on file  Occupational History   Not on file  Tobacco Use   Smoking status: Never   Smokeless tobacco: Never  Vaping Use   Vaping Use: Never used  Substance and Sexual Activity   Alcohol use: No   Drug use: No   Sexual activity: Not on file  Other Topics Concern   Not on file  Social History Narrative   Not on file    Social Determinants of Health   Financial Resource Strain: Not on file  Food Insecurity: Not on file  Transportation Needs: Not on file  Physical Activity: Not on file  Stress: Not on file  Social Connections: Not on file     Family History: The patient's family history includes Colon cancer in her sister; Emphysema in her father; Heart disease in her father; Hyperlipidemia in her brother, sister, and sister; Hypertension in her brother, brother, brother, father, sister, sister, sister, sister, and sister.    ROS:   Please see the history of present illness.    All other systems reviewed and are negative.   EKGs/Labs/Other Studies Reviewed:    The following studies were reviewed today:  Echo 02/2021 1. Left ventricular ejection fraction, by estimation, is 60 to 65%. Left  ventricular ejection fraction by 3D volume is 60 %. The left ventricle has  normal function. The left ventricle has no regional wall motion  abnormalities. There is moderate left  ventricular hypertrophy. Left ventricular diastolic parameters are  consistent with Grade I diastolic dysfunction (impaired relaxation). The  average left ventricular global longitudinal strain is -22.9 %. The global  longitudinal strain is normal.   2. Right ventricular systolic function is normal. The right ventricular  size is normal. Tricuspid regurgitation signal is inadequate for assessing  PA pressure.   3. The mitral valve is grossly normal. Trivial mitral valve  regurgitation.   4. The aortic valve is tricuspid. Aortic valve regurgitation is not  visualized.   5. The inferior vena cava is normal in size with greater than 50%  respiratory variability, suggesting right atrial pressure of 3 mmHg.   Comparison(s): No prior Echocardiogram.   EKG:  EKG is not ordered today.  Recent Labs: 01/22/2021: BUN 10; Creatinine, Ser 0.67; Potassium 4.4; Sodium 138 02/03/2021: Magnesium 1.8  Recent Lipid Panel    Component Value  Date/Time   CHOL 187 03/15/2015 1219   TRIG 83 03/15/2015 1219   HDL 89 03/15/2015 1219   CHOLHDL 2.1 03/15/2015 1219   VLDL 17 03/15/2015 1219   LDLCALC 81 03/15/2015 1219      Physical Exam:    VS:  BP 140/80   Pulse 77   Ht '5\' 4"'$  (1.626 m)   Wt 162 lb (73.5 kg)   SpO2 98%   BMI 27.81 kg/m     Wt Readings from Last 3 Encounters:  11/13/21 162 lb (73.5 kg)  07/24/21 161 lb 3.2 oz (73.1 kg)  03/11/21 160 lb 6.4 oz (72.8 kg)     GEN:  Well nourished, well developed in no acute distress HEENT: Normal NECK: No JVD; No carotid bruits LYMPHATICS: No lymphadenopathy CARDIAC: RRR, no murmurs, rubs, gallops RESPIRATORY:  Clear to auscultation without rales, wheezing or rhonchi  ABDOMEN: Soft, non-tender, non-distended MUSCULOSKELETAL:  No edema; No deformity  SKIN: Warm and dry  NEUROLOGIC:  Alert and oriented x 3 PSYCHIATRIC:  Normal affect   ASSESSMENT AND PLAN:    Palpitation Very seldom occurring once every few months at night while laying down.  Asymptomatic.  Resolves with holding breath makes suspicious for SVT.  I have recommended Apple watch versus Kardia device.  She will let us know if worsening symptoms.  2.  Hypertension -Stable on current medication  3.  Hyperlipidemia -Followed by PCP.      Medication Adjustments/Labs and Tests Ordered: Current medicines are reviewed at length with the patient today.  Concerns regarding medicines are outlined above.  No orders of the defined types were placed in this encounter.  No orders of the defined types were placed in this encounter.   There are no Patient Instructions on file for this visit.   Jarrett Soho, [place]  11/13/2021 8:37 AM    [place]

## 2021-11-21 ENCOUNTER — Other Ambulatory Visit: Payer: Self-pay

## 2021-11-21 MED ORDER — LOSARTAN POTASSIUM 100 MG PO TABS: 100.0000 mg | ORAL_TABLET | Freq: Every day | ORAL | 3 refills | Status: DC

## 2021-12-30 ENCOUNTER — Other Ambulatory Visit: Payer: Self-pay | Admitting: Family Medicine

## 2021-12-30 DIAGNOSIS — Z1231 Encounter for screening mammogram for malignant neoplasm of breast: Secondary | ICD-10-CM

## 2022-01-22 ENCOUNTER — Ambulatory Visit
Admission: RE | Admit: 2022-01-22 | Discharge: 2022-01-22 | Disposition: A | Discharge status: Home / Self Care | Payer: PPO | Source: Ambulatory Visit | Attending: Family Medicine | Admitting: Family Medicine

## 2022-01-22 ENCOUNTER — Ambulatory Visit: Payer: PPO

## 2022-01-22 DIAGNOSIS — Z1231 Encounter for screening mammogram for malignant neoplasm of breast: Secondary | ICD-10-CM | POA: Diagnosis not present

## 2022-01-26 DIAGNOSIS — N362 Urethral caruncle: Secondary | ICD-10-CM | POA: Diagnosis not present

## 2022-01-26 DIAGNOSIS — R319 Hematuria, unspecified: Secondary | ICD-10-CM | POA: Diagnosis not present

## 2022-01-26 DIAGNOSIS — N289 Disorder of kidney and ureter, unspecified: Secondary | ICD-10-CM | POA: Diagnosis not present

## 2022-02-02 DIAGNOSIS — Z6828 Body mass index (BMI) 28.0-28.9, adult: Secondary | ICD-10-CM | POA: Diagnosis not present

## 2022-02-02 DIAGNOSIS — Z01419 Encounter for gynecological examination (general) (routine) without abnormal findings: Secondary | ICD-10-CM | POA: Diagnosis not present

## 2022-02-26 DIAGNOSIS — Z6827 Body mass index (BMI) 27.0-27.9, adult: Secondary | ICD-10-CM | POA: Diagnosis not present

## 2022-02-26 DIAGNOSIS — G479 Sleep disorder, unspecified: Secondary | ICD-10-CM | POA: Diagnosis not present

## 2022-03-06 ENCOUNTER — Other Ambulatory Visit: Payer: Self-pay

## 2022-03-06 MED ORDER — SPIRONOLACTONE 25 MG PO TABS: ORAL_TABLET | ORAL | 2 refills | Status: DC

## 2022-04-17 ENCOUNTER — Other Ambulatory Visit: Payer: Self-pay

## 2022-04-17 MED ORDER — HYDRALAZINE HCL 50 MG PO TABS: ORAL_TABLET | ORAL | 2 refills | Status: DC

## 2022-05-25 DIAGNOSIS — H2513 Age-related nuclear cataract, bilateral: Secondary | ICD-10-CM | POA: Diagnosis not present

## 2022-05-25 DIAGNOSIS — H40053 Ocular hypertension, bilateral: Secondary | ICD-10-CM | POA: Diagnosis not present

## 2022-05-25 DIAGNOSIS — H43813 Vitreous degeneration, bilateral: Secondary | ICD-10-CM | POA: Diagnosis not present

## 2022-07-21 DIAGNOSIS — Z0000 Encounter for general adult medical examination without abnormal findings: Secondary | ICD-10-CM | POA: Diagnosis not present

## 2022-07-21 DIAGNOSIS — D72819 Decreased white blood cell count, unspecified: Secondary | ICD-10-CM | POA: Diagnosis not present

## 2022-07-21 DIAGNOSIS — E7800 Pure hypercholesterolemia, unspecified: Secondary | ICD-10-CM | POA: Diagnosis not present

## 2022-07-21 DIAGNOSIS — M818 Other osteoporosis without current pathological fracture: Secondary | ICD-10-CM | POA: Diagnosis not present

## 2022-07-21 DIAGNOSIS — G4700 Insomnia, unspecified: Secondary | ICD-10-CM | POA: Diagnosis not present

## 2022-07-21 DIAGNOSIS — L299 Pruritus, unspecified: Secondary | ICD-10-CM | POA: Diagnosis not present

## 2022-07-21 DIAGNOSIS — Z23 Encounter for immunization: Secondary | ICD-10-CM | POA: Diagnosis not present

## 2022-07-21 DIAGNOSIS — J45909 Unspecified asthma, uncomplicated: Secondary | ICD-10-CM | POA: Diagnosis not present

## 2022-07-21 DIAGNOSIS — I10 Essential (primary) hypertension: Secondary | ICD-10-CM | POA: Diagnosis not present

## 2022-08-27 ENCOUNTER — Ambulatory Visit
Admission: RE | Admit: 2022-08-27 | Discharge: 2022-08-27 | Disposition: A | Discharge status: Home / Self Care | Payer: PPO | Source: Ambulatory Visit | Attending: Gastroenterology | Admitting: Gastroenterology

## 2022-08-27 ENCOUNTER — Other Ambulatory Visit: Payer: Self-pay | Admitting: Gastroenterology

## 2022-08-27 DIAGNOSIS — Z800 Family history of malignant neoplasm of digestive organs: Secondary | ICD-10-CM | POA: Diagnosis not present

## 2022-08-27 DIAGNOSIS — R140 Abdominal distension (gaseous): Secondary | ICD-10-CM | POA: Diagnosis not present

## 2022-08-27 DIAGNOSIS — Z86010 Personal history of colonic polyps: Secondary | ICD-10-CM | POA: Diagnosis not present

## 2022-08-27 DIAGNOSIS — R109 Unspecified abdominal pain: Secondary | ICD-10-CM | POA: Diagnosis not present

## 2022-08-27 DIAGNOSIS — R194 Change in bowel habit: Secondary | ICD-10-CM | POA: Diagnosis not present

## 2022-09-09 DIAGNOSIS — L814 Other melanin hyperpigmentation: Secondary | ICD-10-CM | POA: Diagnosis not present

## 2022-09-09 DIAGNOSIS — L821 Other seborrheic keratosis: Secondary | ICD-10-CM | POA: Diagnosis not present

## 2022-09-09 DIAGNOSIS — R202 Paresthesia of skin: Secondary | ICD-10-CM | POA: Diagnosis not present

## 2022-10-20 DIAGNOSIS — Z800 Family history of malignant neoplasm of digestive organs: Secondary | ICD-10-CM | POA: Diagnosis not present

## 2022-10-20 DIAGNOSIS — K5730 Diverticulosis of large intestine without perforation or abscess without bleeding: Secondary | ICD-10-CM | POA: Diagnosis not present

## 2022-10-20 DIAGNOSIS — R194 Change in bowel habit: Secondary | ICD-10-CM | POA: Diagnosis not present

## 2022-10-20 DIAGNOSIS — K649 Unspecified hemorrhoids: Secondary | ICD-10-CM | POA: Diagnosis not present

## 2022-11-06 ENCOUNTER — Other Ambulatory Visit: Payer: Self-pay

## 2022-11-06 MED ORDER — LOSARTAN POTASSIUM 100 MG PO TABS: 100.0000 mg | ORAL_TABLET | Freq: Every day | ORAL | 0 refills | Status: DC

## 2022-11-13 DIAGNOSIS — R350 Frequency of micturition: Secondary | ICD-10-CM | POA: Diagnosis not present

## 2022-11-13 DIAGNOSIS — G588 Other specified mononeuropathies: Secondary | ICD-10-CM | POA: Diagnosis not present

## 2022-11-13 DIAGNOSIS — Z6827 Body mass index (BMI) 27.0-27.9, adult: Secondary | ICD-10-CM | POA: Diagnosis not present

## 2022-12-01 ENCOUNTER — Ambulatory Visit: Payer: PPO | Admitting: Cardiovascular Disease

## 2022-12-03 ENCOUNTER — Encounter: Payer: Self-pay | Admitting: Cardiovascular Disease

## 2022-12-03 NOTE — Progress Notes (Signed)
Cardiology Office Note   Date:  12/04/2022   ID:  Caitlin Walker, DOB 01-Jul-1945, MRN [id number]  PCP:  Caitlin Floro, MD  Cardiologist:   Caitlin Miss, MD   Chief Complaint  Patient presents with   Hypertension         1. Hypertension 2. Insomnia   Caitlin Walker is a 77 year old female with a history of hypertension. She has done very well. She's not having episodes of chest pain or shortness of breath. She avoids eating any extra salt.  She exercises several times a week.  She is having trouble with insomnia.  May 12, 2012: She has been doing well from a cardiac standpoint.  She has had some lightheadness.  She also has had some leg cramps.  She has not been exercising.  She has been watching her salt intake.   She still works full time in the OR.  Oct. 1, 2014:  [name] is doing ok.  Still has leg cramps.  She has been busy - her was diagnosed with breast cancer.  She is running around lots .  No CP or dyspnea.  Has occasional indigestion.   She works in the [employer] OR.    Nov. 12, 2014 She is doing ok  Sep 18, 2013:  Nov. 9, 2015:  [name] is doing well.    She has retired.   Has developed a neuroma on the bottom of her foot.  BP has been a little higher.   Sep 17, 2014:   Caitlin Walker is a 77 y.o. female who presents for HTN She is doing well. No CP,  BP has been well controlled.   Nov. 14, 2016:  Doing well.  Has had some GI bleeding - has not found the source yet.  Has had upper ECG.  CT , barium swallow .  Lipids have improved significantly   Oct 01, 2015: Caitlin Walker is seen back today for eval of her HTN.  Was diagnosed with Purtussis in Feb.   Still coughing  BP is well controlled.  Remains active   03/30/2016:  Has been a rough year.  Is separated from her husband . Has had lots of anxiety  BP has been well controlled.  Occasional , rare episodes of CP   April 05, 2017:  Doing well from a cardiac standpoint .    BP has been well  controled.    Just did a 6 night stretch of work  ( 52 hours )  Feels well   April 11, 2018:  Caitlin Walker is seen today for follow-up of her hypertension.  Her blood pressure is mildly elevated today. Has not tolerated amlodipine, vasotec, Norvasc, bystolic,  Valsartan   December 04, 2019:  Caitlin Walker is seen today for follow-up of her hypertension. BP is a bit elevated.  Tries to avoid salty foods.     Had hemmorhoid surgery in Feb.   July 08, 2020: Caitlin Walker is seen today for follow up of her HTN BP looks great today  We have recently added Spironolactone and increased hydralazine   Oct. 3, 2022 Caitlin Walker is seen for follow up of her HTN Having lots of leg cramps  Has tried mustard and also  tonic water.  BP at home are mildly elevated.  Had covid in July .  Breathing is better . Walks 3 miles, 3 times a week   Dec 04, 2022  Caitlin Walker is seen for follow up of her HTN Brought her BP log  Exercises on occasion  Has numbness of her perinium when she sits  Is looking for a neuro specialist to help her with this        Past Medical History:  Diagnosis Date   Chronic leukopenia DX  2011   MILD--  HEMATOLOGIST  --  DR Cyndie Chime   H/O hiatal hernia    TINY PER EGD 2007   Hematuria    Hemorrhoids    INTERNAL AND EXTERNAL    History of colon polyps    BENIGN   Hyperlipidemia    Hypertension    Ureterocele    LEFT   Wears contact lenses     Past Surgical History:  Procedure Laterality Date   APPENDECTOMY  1989   BREAST BIOPSY Left    benign   COLONOSCOPY N/A 08/01/2012   Procedure: COLONOSCOPY;  Surgeon: Petra Kuba, MD;  Location: [facility];  Service: Endoscopy;  Laterality: N/A;  pt. would like dan to do procedure   CYSTO/  URETHRAL DILATION /  RIGHT RETROGRADE PYELOGRAM/ RIGHT URETEROSCOPY/  INSTILLATION THERAPY  06-18-2008   CYSTOSCOPY/RETROGRADE/URETEROSCOPY Left 10/26/2013   Procedure: CYSTOSCOPY/RETROGRADE/DIAGNOSTIC DIGITAL URETEROSCOPY WITH LEFT URETERAL  STENT PLACEMENT.;  Surgeon: Sebastian Ache, MD;  Location: [facility];  Service: Urology;  Laterality: Left;   DILATION AND CURETTAGE OF UTERUS  2001   SUPRACERVICAL ABDOMINAL HYSTERECTOMY  02-25-2000   W/   BILATERAL SALPINGOOPHORECTOMY AND RECTOCELE REPAIR     Current Outpatient Medications  Medication Sig Dispense Refill   calcium carbonate (OS-CAL) 600 MG TABS Take 600 mg by mouth daily with breakfast.      chlorpheniramine (CHLOR-TRIMETON) 4 MG tablet as needed for allergies.     fluticasone (FLONASE) 50 MCG/ACT nasal spray Place 2 sprays into both nostrils daily.     hydrALAZINE (APRESOLINE) 50 MG tablet TAKE 1 TABLET(50 MG) BY MOUTH THREE TIMES DAILY 270 tablet 2   losartan (COZAAR) 100 MG tablet Take 1 tablet (100 mg total) by mouth daily. 90 tablet 0   Magnesium Oxide 400 MG CAPS Take 1 capsule (400 mg total) by mouth in the morning and at bedtime.  0   omeprazole (PRILOSEC) 20 MG capsule daily.     spironolactone (ALDACTONE) 25 MG tablet TAKE 1 TABLET(25 MG) BY MOUTH DAILY 90 tablet 2   zolpidem (AMBIEN) 10 MG tablet Take 1 tablet by mouth at bedtime as needed for sleep.  5   albuterol (VENTOLIN HFA) 108 (90 Base) MCG/ACT inhaler SMARTSIG:2 Puff(s) By Mouth Every 4 Hours PRN     nitrofurantoin, macrocrystal-monohydrate, (MACROBID) 100 MG capsule Take 100 mg by mouth 2 (two) times daily.     No current facility-administered medications for this visit.    Allergies:   Amlodipine, Bystolic [nebivolol hcl], Diovan [valsartan], Ergocalciferol, Metoprolol, Olmesartan, Prednisone, Sulfamethoxazole-trimethoprim, Trazodone, Bactrim, Flomax [tamsulosin hcl], and Sulfa antibiotics    Social History:  The patient  reports that she has never smoked. She has never used smokeless tobacco. She reports that she does not drink alcohol and does not use drugs.   Family History:  The patient's family history includes Breast cancer in her sister; Colon cancer in her sister; Emphysema  in her father; Heart disease in her father; Hyperlipidemia in her brother, sister, and sister; Hypertension in her brother, brother, brother, father, sister, sister, sister, sister, and sister.    ROS: As noted in the current history.  Otherwise the review of systems is negative.   Physical Exam: Blood pressure 130/82, pulse 74, height  5' 4.5" (1.638 m), weight 158 lb (71.7 kg), SpO2 98%.       GEN:  Well nourished, well developed in no acute distress HEENT: Normal NECK: No JVD; No carotid bruits LYMPHATICS: No lymphadenopathy CARDIAC: RRR , no murmurs, rubs, gallops RESPIRATORY:  Clear to auscultation without rales, wheezing or rhonchi  ABDOMEN: Soft, non-tender, non-distended MUSCULOSKELETAL:  No edema; No deformity  SKIN: Warm and dry NEUROLOGIC:  Alert and oriented x 3   EKG: EKG Interpretation Date/Time:  Friday December 04 2022 13:57:40 EDT Ventricular Rate:  74 PR Interval:  166 QRS Duration:  72 QT Interval:  378 QTC Calculation: 419 R Axis:   43  Text Interpretation: Normal sinus rhythm Normal ECG When compared with ECG of 15-Oct-2017 12:32, No significant change was found Confirmed by Caitlin Walker ([id number]) on 12/04/2022 2:13:34 PM          Recent Labs: No results found for requested labs within last 365 days.    Lipid Panel    Component Value Date/Time   CHOL 187 03/15/2015 1219   TRIG 83 03/15/2015 1219   HDL 89 03/15/2015 1219   CHOLHDL 2.1 03/15/2015 1219   VLDL 17 03/15/2015 1219   LDLCALC 81 03/15/2015 1219      Wt Readings from Last 3 Encounters:  12/04/22 158 lb (71.7 kg)  11/13/21 162 lb (73.5 kg)  07/24/21 161 lb 3.2 oz (73.1 kg)      Other studies Reviewed: Additional studies/ records that were reviewed today include: . Review of the above records demonstrates:    ASSESSMENT AND PLAN:  1.  Essential hypertension:     She will continue to wokr on getting more exercise  Limt her salt intake   Cont meds Will see her in 1 year              Labs/ tests ordered today include:  Orders Placed This Encounter  Procedures   EKG 12-Lead       Caitlin Miss, MD  12/04/2022 2:19 PM    [facility] [address] [place], [place], [place]  [address] Phone: [phone]; Fax: [phone]

## 2022-12-04 ENCOUNTER — Ambulatory Visit: Payer: PPO | Attending: Cardiovascular Disease | Admitting: Cardiovascular Disease

## 2022-12-04 ENCOUNTER — Encounter: Payer: Self-pay | Admitting: Cardiovascular Disease

## 2022-12-04 VITALS — BP 130/82 | HR 74 | Ht 64.5 in | Wt 158.0 lb

## 2022-12-04 DIAGNOSIS — I10 Essential (primary) hypertension: Secondary | ICD-10-CM | POA: Diagnosis not present

## 2022-12-04 NOTE — Patient Instructions (Signed)
Medication Instructions:  Your physician recommends that you continue on your current medications as directed. Please refer to the Current Medication list given to you today.  *If you need a refill on your cardiac medications before your next appointment, please call your pharmacy*   Lab Work: NONE If you have labs (blood work) drawn today and your tests are completely normal, you will receive your results only by: MyChart Message (if you have MyChart) OR A paper copy in the mail If you have any lab test that is abnormal or we need to change your treatment, we will call you to review the results.   Testing/Procedures: NONE   Follow-Up: At [facility], you and your health needs are our priority.  As part of our continuing mission to provide you with exceptional heart care, we have created designated Provider Care Teams.  These Care Teams include your primary Cardiologist (physician) and Advanced Practice Providers (APPs -  Physician Assistants and Nurse Practitioners) who all work together to provide you with the care you need, when you need it.  We recommend signing up for the patient portal called "MyChart".  Sign up information is provided on this After Visit Summary.  MyChart is used to connect with patients for Virtual Visits (Telemedicine).  Patients are able to view lab/test results, encounter notes, upcoming appointments, etc.  Non-urgent messages can be sent to your provider as well.   To learn more about what you can do with MyChart, go to [email or web address].    Your next appointment:   1 year(s)  Provider:   Philip Nahser, MD      

## 2022-12-14 DIAGNOSIS — Z1231 Encounter for screening mammogram for malignant neoplasm of breast: Secondary | ICD-10-CM

## 2022-12-22 DIAGNOSIS — H43811 Vitreous degeneration, right eye: Secondary | ICD-10-CM | POA: Diagnosis not present

## 2023-01-01 ENCOUNTER — Other Ambulatory Visit: Payer: Self-pay | Admitting: Family Medicine

## 2023-01-01 DIAGNOSIS — Z1231 Encounter for screening mammogram for malignant neoplasm of breast: Secondary | ICD-10-CM

## 2023-01-12 ENCOUNTER — Other Ambulatory Visit: Payer: Self-pay | Admitting: Cardiovascular Disease

## 2023-01-20 DIAGNOSIS — Z6827 Body mass index (BMI) 27.0-27.9, adult: Secondary | ICD-10-CM | POA: Diagnosis not present

## 2023-01-20 DIAGNOSIS — M5416 Radiculopathy, lumbar region: Secondary | ICD-10-CM | POA: Diagnosis not present

## 2023-01-20 DIAGNOSIS — G4700 Insomnia, unspecified: Secondary | ICD-10-CM | POA: Diagnosis not present

## 2023-01-21 ENCOUNTER — Other Ambulatory Visit: Payer: Self-pay | Admitting: Family Medicine

## 2023-01-21 DIAGNOSIS — M5416 Radiculopathy, lumbar region: Secondary | ICD-10-CM

## 2023-01-25 ENCOUNTER — Ambulatory Visit
Admission: RE | Admit: 2023-01-25 | Discharge: 2023-01-25 | Disposition: A | Discharge status: Home / Self Care | Payer: PPO | Source: Ambulatory Visit

## 2023-01-25 DIAGNOSIS — Z1231 Encounter for screening mammogram for malignant neoplasm of breast: Secondary | ICD-10-CM | POA: Diagnosis not present

## 2023-01-30 ENCOUNTER — Ambulatory Visit
Admission: RE | Admit: 2023-01-30 | Discharge: 2023-01-30 | Disposition: A | Discharge status: Home / Self Care | Payer: PPO | Source: Ambulatory Visit | Attending: Family Medicine | Admitting: Family Medicine

## 2023-01-30 DIAGNOSIS — M5416 Radiculopathy, lumbar region: Secondary | ICD-10-CM | POA: Diagnosis not present

## 2023-02-03 ENCOUNTER — Other Ambulatory Visit: Payer: Self-pay | Admitting: Cardiovascular Disease

## 2023-02-16 DIAGNOSIS — N958 Other specified menopausal and perimenopausal disorders: Secondary | ICD-10-CM | POA: Diagnosis not present

## 2023-02-16 DIAGNOSIS — Z6828 Body mass index (BMI) 28.0-28.9, adult: Secondary | ICD-10-CM | POA: Diagnosis not present

## 2023-02-16 DIAGNOSIS — Z01419 Encounter for gynecological examination (general) (routine) without abnormal findings: Secondary | ICD-10-CM | POA: Diagnosis not present

## 2023-02-16 DIAGNOSIS — M816 Localized osteoporosis [Lequesne]: Secondary | ICD-10-CM | POA: Diagnosis not present

## 2023-02-17 DIAGNOSIS — M51369 Other intervertebral disc degeneration, lumbar region without mention of lumbar back pain or lower extremity pain: Secondary | ICD-10-CM | POA: Diagnosis not present

## 2023-02-17 DIAGNOSIS — M519 Unspecified thoracic, thoracolumbar and lumbosacral intervertebral disc disorder: Secondary | ICD-10-CM | POA: Diagnosis not present

## 2023-03-02 DIAGNOSIS — R3915 Urgency of urination: Secondary | ICD-10-CM | POA: Diagnosis not present

## 2023-03-02 DIAGNOSIS — R350 Frequency of micturition: Secondary | ICD-10-CM | POA: Diagnosis not present

## 2023-03-03 DIAGNOSIS — M519 Unspecified thoracic, thoracolumbar and lumbosacral intervertebral disc disorder: Secondary | ICD-10-CM | POA: Diagnosis not present

## 2023-03-03 DIAGNOSIS — M51369 Other intervertebral disc degeneration, lumbar region without mention of lumbar back pain or lower extremity pain: Secondary | ICD-10-CM | POA: Diagnosis not present

## 2023-03-10 DIAGNOSIS — M519 Unspecified thoracic, thoracolumbar and lumbosacral intervertebral disc disorder: Secondary | ICD-10-CM | POA: Diagnosis not present

## 2023-03-10 DIAGNOSIS — M51369 Other intervertebral disc degeneration, lumbar region without mention of lumbar back pain or lower extremity pain: Secondary | ICD-10-CM | POA: Diagnosis not present

## 2023-03-17 DIAGNOSIS — M519 Unspecified thoracic, thoracolumbar and lumbosacral intervertebral disc disorder: Secondary | ICD-10-CM | POA: Diagnosis not present

## 2023-03-17 DIAGNOSIS — M51369 Other intervertebral disc degeneration, lumbar region without mention of lumbar back pain or lower extremity pain: Secondary | ICD-10-CM | POA: Diagnosis not present

## 2023-03-24 DIAGNOSIS — M51369 Other intervertebral disc degeneration, lumbar region without mention of lumbar back pain or lower extremity pain: Secondary | ICD-10-CM | POA: Diagnosis not present

## 2023-03-24 DIAGNOSIS — M519 Unspecified thoracic, thoracolumbar and lumbosacral intervertebral disc disorder: Secondary | ICD-10-CM | POA: Diagnosis not present

## 2023-04-29 DIAGNOSIS — N760 Acute vaginitis: Secondary | ICD-10-CM | POA: Diagnosis not present

## 2023-04-29 DIAGNOSIS — Z113 Encounter for screening for infections with a predominantly sexual mode of transmission: Secondary | ICD-10-CM | POA: Diagnosis not present

## 2023-04-29 DIAGNOSIS — N952 Postmenopausal atrophic vaginitis: Secondary | ICD-10-CM | POA: Diagnosis not present

## 2023-05-28 DIAGNOSIS — H33311 Horseshoe tear of retina without detachment, right eye: Secondary | ICD-10-CM | POA: Diagnosis not present

## 2023-05-28 DIAGNOSIS — H2513 Age-related nuclear cataract, bilateral: Secondary | ICD-10-CM | POA: Diagnosis not present

## 2023-05-28 DIAGNOSIS — H40003 Preglaucoma, unspecified, bilateral: Secondary | ICD-10-CM | POA: Diagnosis not present

## 2023-05-28 DIAGNOSIS — H43813 Vitreous degeneration, bilateral: Secondary | ICD-10-CM | POA: Diagnosis not present

## 2023-07-21 DIAGNOSIS — E7800 Pure hypercholesterolemia, unspecified: Secondary | ICD-10-CM | POA: Diagnosis not present

## 2023-07-21 DIAGNOSIS — M818 Other osteoporosis without current pathological fracture: Secondary | ICD-10-CM | POA: Diagnosis not present

## 2023-07-21 DIAGNOSIS — D72819 Decreased white blood cell count, unspecified: Secondary | ICD-10-CM | POA: Diagnosis not present

## 2023-07-21 DIAGNOSIS — I10 Essential (primary) hypertension: Secondary | ICD-10-CM | POA: Diagnosis not present

## 2023-07-26 DIAGNOSIS — Z0000 Encounter for general adult medical examination without abnormal findings: Secondary | ICD-10-CM | POA: Diagnosis not present

## 2023-07-26 DIAGNOSIS — E7800 Pure hypercholesterolemia, unspecified: Secondary | ICD-10-CM | POA: Diagnosis not present

## 2023-07-26 DIAGNOSIS — G4700 Insomnia, unspecified: Secondary | ICD-10-CM | POA: Diagnosis not present

## 2023-07-26 DIAGNOSIS — Z862 Personal history of diseases of the blood and blood-forming organs and certain disorders involving the immune mechanism: Secondary | ICD-10-CM | POA: Diagnosis not present

## 2023-07-26 DIAGNOSIS — Z6826 Body mass index (BMI) 26.0-26.9, adult: Secondary | ICD-10-CM | POA: Diagnosis not present

## 2023-07-26 DIAGNOSIS — K219 Gastro-esophageal reflux disease without esophagitis: Secondary | ICD-10-CM | POA: Diagnosis not present

## 2023-07-26 DIAGNOSIS — I10 Essential (primary) hypertension: Secondary | ICD-10-CM | POA: Diagnosis not present

## 2023-08-31 DIAGNOSIS — B002 Herpesviral gingivostomatitis and pharyngotonsillitis: Secondary | ICD-10-CM | POA: Diagnosis not present

## 2023-08-31 DIAGNOSIS — J069 Acute upper respiratory infection, unspecified: Secondary | ICD-10-CM | POA: Diagnosis not present

## 2023-09-10 DIAGNOSIS — Z6827 Body mass index (BMI) 27.0-27.9, adult: Secondary | ICD-10-CM | POA: Diagnosis not present

## 2023-09-10 DIAGNOSIS — R053 Chronic cough: Secondary | ICD-10-CM | POA: Diagnosis not present

## 2023-10-07 DIAGNOSIS — D485 Neoplasm of uncertain behavior of skin: Secondary | ICD-10-CM | POA: Diagnosis not present

## 2023-10-07 DIAGNOSIS — L538 Other specified erythematous conditions: Secondary | ICD-10-CM | POA: Diagnosis not present

## 2023-10-07 DIAGNOSIS — D0462 Carcinoma in situ of skin of left upper limb, including shoulder: Secondary | ICD-10-CM | POA: Diagnosis not present

## 2023-10-07 DIAGNOSIS — R208 Other disturbances of skin sensation: Secondary | ICD-10-CM | POA: Diagnosis not present

## 2023-10-18 DIAGNOSIS — H10503 Unspecified blepharoconjunctivitis, bilateral: Secondary | ICD-10-CM | POA: Diagnosis not present

## 2023-11-04 ENCOUNTER — Other Ambulatory Visit: Payer: Self-pay | Admitting: Cardiovascular Disease

## 2023-11-08 DIAGNOSIS — D0462 Carcinoma in situ of skin of left upper limb, including shoulder: Secondary | ICD-10-CM | POA: Diagnosis not present

## 2023-11-08 DIAGNOSIS — L905 Scar conditions and fibrosis of skin: Secondary | ICD-10-CM | POA: Diagnosis not present

## 2023-11-10 DIAGNOSIS — H903 Sensorineural hearing loss, bilateral: Secondary | ICD-10-CM | POA: Diagnosis not present

## 2023-11-11 DIAGNOSIS — H10503 Unspecified blepharoconjunctivitis, bilateral: Secondary | ICD-10-CM | POA: Diagnosis not present

## 2023-12-03 ENCOUNTER — Ambulatory Visit: Admitting: Pulmonary Disease

## 2023-12-03 ENCOUNTER — Encounter: Payer: Self-pay | Admitting: Pulmonary Disease

## 2023-12-03 VITALS — BP 152/92 | HR 73 | Temp 98.6°F | Ht 64.0 in | Wt 160.0 lb

## 2023-12-03 DIAGNOSIS — J45991 Cough variant asthma: Secondary | ICD-10-CM | POA: Diagnosis not present

## 2023-12-03 NOTE — Progress Notes (Signed)
 Caitlin Walker    [id number]    1945/09/19  Primary Care Physician:Ross, Carlin Redbird, MD  Referring Physician: Okey Carlin Redbird, MD [address] [place],  [place] [address]  Chief complaint: Follow up for chronic cough  HPI: 78 y.o. with history of hypertension, hyperlipidemia Developed chronic nonproductive cough in nov 2020.  Cough is nonproductive in nature, paroxysmal, no associated fevers or chills. The episodes of cough have precipitated a pulled chest muscle, hemorrhoids and conjunctival hemorrhage.  Treated with amoxicillin, Depo Medrol  injection, Tessalon , Prilosec.  She had a chest x-ray at urgent care around November 2020 and was told it was normal  She started taking Zyrtec with Flonase few weeks ago and reports that the cough has improved. Previously seen in pulmonary clinic by Dr. Darlean in 2017 for similar complaints.  She reports recurrence of these episodes about 1 time every year.  She is okay in the interim  Seen back in clinic in September 2023 for recurrence of cough after she developed mild COVID-19 infection Restarted Prilosec, Flonase and chlorpheniramine with marked improvement She was also given prednisone taper Chest x-ray in September did not show any acute abnormality Given Symbicort  but stopped using as it caused side effects of hoarseness  Interim history: Discussed the use of AI scribe software for clinical note transcription with the patient, who gave verbal consent to proceed.  History of Present Illness Caitlin Walker is a 78 year old female with GERD and chronic cough who presents with persistent cough and new onset shortness of breath.  Chronic cough - Persistent cough since a cold in May 2025 - Initial antibiotic therapy was ineffective - Deprimidra injection provided some improvement - Cough episodes recur, particularly when not adhering to medication regimen - Cough is associated with symptom flare-ups  Dyspnea - New  onset shortness of breath occurring a few days after coughing episodes - Able to walk two to three miles daily without experiencing shortness of breath during these activities  Gastroesophageal reflux disease (gerd) and upper airway symptoms - GERD managed with Prilosec twice daily during episodes of coughing - Intermittent use of steroid nasal spray (Flonase) and antihistamines (chlorpheniramine) as needed for symptom management - Medications are not used daily, only during symptom flare-ups  Pulmonary function and medication use - Lung function tests in 2021 were normal - Not currently on steroids or antibiotics - Does not use an inhaler   Relevant pulmonary history Pets: No pets Occupation: Retired Geologist, engineering Exposures: No known exposures.  No mold, hot tub, Jacuzzi or no down pillows or comforters Smoking history: Never smoker Travel history: Recently lived in [place] .  No significant recent travel Relevant family history: Father died of emphysema.  He was a smoker   Outpatient Encounter Medications as of 12/03/2023  Medication Sig   calcium carbonate (OS-CAL) 600 MG TABS Take 600 mg by mouth daily with breakfast.    chlorpheniramine (CHLOR-TRIMETON) 4 MG tablet as needed for allergies.   fluticasone (FLONASE) 50 MCG/ACT nasal spray Place 2 sprays into both nostrils daily.   hydrALAZINE  (APRESOLINE ) 50 MG tablet TAKE 1 TABLET(50 MG) BY MOUTH THREE TIMES DAILY   losartan  (COZAAR ) 100 MG tablet TAKE 1 TABLET(100 MG) BY MOUTH DAILY   Magnesium  Oxide 400 MG CAPS Take 1 capsule (400 mg total) by mouth in the morning and at bedtime.   omeprazole (PRILOSEC) 20 MG capsule daily.   spironolactone  (ALDACTONE ) 25 MG tablet TAKE 1 TABLET(25  MG) BY MOUTH DAILY   zolpidem (AMBIEN) 10 MG tablet Take 1 tablet by mouth at bedtime as needed for sleep.   albuterol  (VENTOLIN  HFA) 108 (90 Base) MCG/ACT inhaler SMARTSIG:2 Puff(s) By Mouth Every 4 Hours PRN   nitrofurantoin,  macrocrystal-monohydrate, (MACROBID) 100 MG capsule Take 100 mg by mouth 2 (two) times daily.   No facility-administered encounter medications on file as of 12/03/2023.   Today's Vitals   12/03/23 1101  BP: (!) 152/92  Pulse: 73  Temp: 98.6 F (37 C)  TempSrc: Oral  SpO2: 98%  Weight: 160 lb (72.6 kg)  Height: 5' 4 (1.626 m)   Body mass index is 27.46 kg/m.   Physical Exam GEN: No acute distress. CV: Regular rate and rhythm, no murmurs. LUNGS: Clear to auscultation bilaterally, normal respiratory effort. SKIN JOINTS: Warm and dry, no rash.    Data Reviewed: Imaging: CTA 10/15/2017-no pulmonary embolism, mild lower lobe subsegmental atelectasis. Chest x-ray 06/28/2019-no acute process. Chest x-ray 01/28/2021-low lung volumes, clear lungs I have reviewed the images personally.  PFTs: FVC 2.44 [9 9%], FEV1 2.06 [99%], F/F 84, TLC 4.29 [82%], DLCO 14.75 [73%] Minimal diffusion defect  Labs: CBC 06/28/2019-WBC 4.2, eos 4%, absolute eosinophil count 168 IgE 06/28/2019-241  Assessment & Plan Chronic cough with suspected reactive airway disease post-viral infection Has history of upper upper airway cough due to postnasal drip and GERD Recent intermittent chronic cough with episodes of dyspnea, likely due to reactive airway disease post-viral infection. Symptoms improved with Depomedrol (steroid) treatment. Lungs are currently clear on examination. Previous lung function tests in 2021 were normal. Differential includes viral infection causing lung inflammation or reactive airway disease. - Order lung function test to reassess current status.  Gastroesophageal reflux disease GERD managed with Prilosec, used twice daily during episodes of coughing. GERD may contribute to chronic cough.  Allergic rhinitis Allergic rhinitis managed with steroid nasal spray and chlorpheniramine as needed. Symptoms are controlled with intermittent use of medications.   Plan/Recommendations: - PFTs -  Steroid nasal spray, chlorphentermine - Prilosec  Caitlin Bachtel MD [facility] 12/03/2023, 11:13 AM  CC: Okey Carlin Redbird, MD

## 2023-12-03 NOTE — Patient Instructions (Signed)
  VISIT SUMMARY: Today, you were seen for your persistent cough and new onset shortness of breath. We discussed your chronic cough, which has been ongoing since May 2025, and your recent episodes of shortness of breath. We also reviewed your management plan for GERD and allergic rhinitis.  YOUR PLAN: -CHRONIC COUGH WITH SUSPECTED REACTIVE AIRWAY DISEASE POST-VIRAL INFECTION: Your chronic cough is likely due to a reactive airway disease that developed after a viral infection. This means your airways are more sensitive and can become inflamed, leading to coughing and shortness of breath. We will order a lung function test to reassess your current lung status.  -GASTROESOPHAGEAL REFLUX DISEASE (GERD): GERD is a condition where stomach acid frequently flows back into the tube connecting your mouth and stomach, which can cause coughing. You are managing this with Prilosec, taken twice daily during episodes of coughing.  -ALLERGIC RHINITIS: Allergic rhinitis is an allergic reaction that causes sneezing, congestion, and a runny nose. You are managing this with a steroid nasal spray and chlorpheniramine as needed.  INSTRUCTIONS: Please schedule a lung function test to reassess your current lung status. Continue taking Prilosec twice daily during episodes of coughing and use your steroid nasal spray and chlorpheniramine as needed for allergic rhinitis symptoms.                      Contains text generated by Abridge.                                 Contains text generated by Abridge.

## 2023-12-07 ENCOUNTER — Other Ambulatory Visit: Payer: Self-pay | Admitting: *Deleted

## 2023-12-07 MED ORDER — HYDRALAZINE HCL 50 MG PO TABS: ORAL_TABLET | ORAL | 0 refills | Status: DC

## 2023-12-17 DIAGNOSIS — H0100A Unspecified blepharitis right eye, upper and lower eyelids: Secondary | ICD-10-CM | POA: Diagnosis not present

## 2023-12-20 DIAGNOSIS — U071 COVID-19: Secondary | ICD-10-CM | POA: Diagnosis not present

## 2023-12-20 DIAGNOSIS — R509 Fever, unspecified: Secondary | ICD-10-CM | POA: Diagnosis not present

## 2023-12-20 DIAGNOSIS — R051 Acute cough: Secondary | ICD-10-CM | POA: Diagnosis not present

## 2023-12-28 DIAGNOSIS — L928 Other granulomatous disorders of the skin and subcutaneous tissue: Secondary | ICD-10-CM | POA: Diagnosis not present

## 2023-12-30 ENCOUNTER — Other Ambulatory Visit: Payer: Self-pay | Admitting: Family Medicine

## 2023-12-30 DIAGNOSIS — Z1231 Encounter for screening mammogram for malignant neoplasm of breast: Secondary | ICD-10-CM

## 2024-01-10 DIAGNOSIS — G4700 Insomnia, unspecified: Secondary | ICD-10-CM | POA: Diagnosis not present

## 2024-01-10 DIAGNOSIS — Z6827 Body mass index (BMI) 27.0-27.9, adult: Secondary | ICD-10-CM | POA: Diagnosis not present

## 2024-01-10 DIAGNOSIS — I10 Essential (primary) hypertension: Secondary | ICD-10-CM | POA: Diagnosis not present

## 2024-01-11 ENCOUNTER — Ambulatory Visit: Attending: Internal Medicine | Admitting: Internal Medicine

## 2024-01-11 ENCOUNTER — Encounter: Payer: Self-pay | Admitting: Internal Medicine

## 2024-01-11 VITALS — BP 128/78 | HR 73 | Ht 64.5 in | Wt 160.2 lb

## 2024-01-11 DIAGNOSIS — I10 Essential (primary) hypertension: Secondary | ICD-10-CM

## 2024-01-11 DIAGNOSIS — I959 Hypotension, unspecified: Secondary | ICD-10-CM | POA: Diagnosis not present

## 2024-01-11 DIAGNOSIS — I119 Hypertensive heart disease without heart failure: Secondary | ICD-10-CM

## 2024-01-11 DIAGNOSIS — I5189 Other ill-defined heart diseases: Secondary | ICD-10-CM

## 2024-01-11 MED ORDER — LOSARTAN POTASSIUM 100 MG PO TABS: 100.0000 mg | ORAL_TABLET | Freq: Every day | ORAL | 0 refills | Status: DC

## 2024-01-11 MED ORDER — HYDRALAZINE HCL 25 MG PO TABS: ORAL_TABLET | ORAL | 3 refills | Status: AC

## 2024-01-11 MED ORDER — SPIRONOLACTONE 25 MG PO TABS: ORAL_TABLET | ORAL | 2 refills | Status: AC

## 2024-01-11 NOTE — Patient Instructions (Addendum)
 Medication Instructions:  Your physician has recommended you make the following change in your medication:  Decrease hydralazine  to 25mg  three times daily  Please check your blood pressure about an hour after you take your medications and send those into us  in 2 weeks.  Lab Work: None ordered.  You may go to any [facility] Location for your lab work:  [facility] - [address] ([facility]) - [address] [phone] [address]  [place] - [address]   [place]  - [address]   [place] - [address] - [address] [employer]  - [address] [place] - [address] ([facility]   If you have labs (blood work) drawn today and your tests are completely normal, you will receive your results only by: [employer] (if you have MyChart)  If you have any lab test that is abnormal or we need to change your treatment, we will call you or send a MyChart message to review the results.  Testing/Procedures: None ordered.  Follow-Up: At [facility], you and your health needs are our priority.  As part of our continuing mission to provide you with exceptional heart care, we have created designated Provider Care Teams.  These Care Teams include your primary Cardiologist (physician) and Advanced Practice Providers (APPs -  Physician Assistants and Nurse Practitioners) who all work together to provide you with the care you need, when you need it.  Your next appointment:   1 year(s) with a APP  The format for your next appointment:   In Person  Provider:   Emeline Calender, DO

## 2024-01-11 NOTE — Progress Notes (Signed)
 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  It was noted previously that she did not tolerate amlodipine, Vasotec, Norvasc, Bystolic, valsartan .  Patient states that in August she switched pharmacies for medications and says that since that time she has noticed that her blood pressure has dropped several times with some headache associated.  She recorded these blood pressures which occasionally shows SBP in the 80s.  With the exception of changing pharmacies and therefore manufacturers of her medication she has not made any adjustments to her medications or diet since that time and is staying well-hydrated.  She believes that the issue may be due to a change in manufacturer of her medications.  She is able to walk 2 miles daily without any chest pain or shortness of breath.  Denies any tobacco use.    ROS:  Review of Systems  All other systems reviewed and are negative.   Physical Exam  Physical Exam Vitals and nursing note reviewed.  Constitutional:      Appearance: Normal appearance.  HENT:     Head: Normocephalic and atraumatic.  Eyes:     Conjunctiva/sclera: Conjunctivae normal.  Neck:     Vascular: No carotid bruit.  Cardiovascular:     Rate and Rhythm: Normal rate and regular rhythm.  Pulmonary:     Effort: Pulmonary effort is normal.     Breath sounds: Normal breath sounds.  Musculoskeletal:        General: No swelling or tenderness.  Skin:    Coloration: Skin is not jaundiced or pale.  Neurological:     Mental Status: She is alert. Mental status is at baseline.  Psychiatric:        Mood and Affect: Mood normal.         Behavior: Behavior normal.     VS:  BP 128/78   Pulse 73   Ht 5' 4.5 (1.638 m)   Wt 160 lb 3.2 oz (72.7 kg)   SpO2 97%   BMI 27.07 kg/m         Wt Readings from Last 3 Encounters:  01/11/24 160 lb 3.2 oz (72.7 kg)  12/03/23 160 lb (72.6 kg)  12/04/22 158 lb (71.7 kg)     EKG   EKG Interpretation Date/Time:  Tuesday January 11 2024 10:56:36 EDT Ventricular Rate:  73 PR Interval:  172 QRS Duration:  72 QT Interval:  366 QTC Calculation: 403 R Axis:   45  Text Interpretation: Sinus rhythm with occasional Premature ventricular complexes When compared with ECG of 04-Dec-2022 13:57, Premature ventricular complexes are now Present Confirmed by Calender Emeline [phone]) on 01/11/2024 11:11:57 AM    Studies Reviewed   Risk Assessment/Calculations              ASSESSMENT  Hypertension with episodes of hypotension currently on hydralazine  50 mg 3 times daily, losartan  100 mg daily, spironolactone  25 mg daily.  Patient is concerned that this is due to a change in manufacturer of her medications Moderate left ventricular hypertrophy Grade 1 diastolic dysfunction  Plan  Will decrease hydralazine  to 25 mg 3 times daily Will try to have her  medications obtained from the desired manufacturers Advised to stay well-hydrated After the change in medications, she is going to check her blood pressure daily for the next 2 weeks and call the office after that with her new blood pressure readings Follow up: In 1 year with the APP.  After that time she may be able to follow-up as needed          Signed, Emeline FORBES Calender, MD

## 2024-01-27 ENCOUNTER — Telehealth: Payer: Self-pay | Admitting: Internal Medicine

## 2024-01-27 DIAGNOSIS — I10 Essential (primary) hypertension: Secondary | ICD-10-CM

## 2024-01-27 MED ORDER — HYDROCHLOROTHIAZIDE 12.5 MG PO CAPS: 12.5000 mg | ORAL_CAPSULE | Freq: Every day | ORAL | 2 refills | Status: DC

## 2024-01-27 NOTE — Telephone Encounter (Signed)
 She previously took hydrochlorothiazide  12.5mg  capsules. Unable to take tablets. Needs the teal and white capsule only. She said should would prefer to take this again instead of increasing hydralazine . She said the provider could look up the manufacturer that she needs for the specific capsules in the PDR  Advised will send to Dr. Kriste Suggested she check her MyChart in case he messages her again

## 2024-01-27 NOTE — Addendum Note (Signed)
 Addended by: Talena Neira E on: 01/27/2024 04:48 PM   Modules accepted: Orders

## 2024-01-27 NOTE — Telephone Encounter (Signed)
 Patient stated she is returning staff call to follow-up on BP readings.

## 2024-01-27 NOTE — Telephone Encounter (Signed)
 Pt was told to call in report her BP after medication change.   9/11:146/94 HR: 89 9/12:147/97 HR:84 9/13: 113/95 HR: 93 9/14:138/103 HR:88 9/15:144/105 HR: 83 9/16: 153/106 HR:82 9/17: 153/106 HR:82 9/18: 173/103 HR:92 9/19:156/101 HR:101 9/20: 144/93 HR:79 9/25: 110/89 HR: 104

## 2024-01-27 NOTE — Telephone Encounter (Signed)
 MyChart encounter sent regarding BP management.

## 2024-01-30 MED ORDER — HYDROCHLOROTHIAZIDE 12.5 MG PO CAPS: 12.5000 mg | ORAL_CAPSULE | Freq: Every day | ORAL | 2 refills | Status: DC

## 2024-01-30 NOTE — Addendum Note (Signed)
 Addended by: Virgilene Stryker E on: 01/30/2024 08:50 PM   Modules accepted: Orders

## 2024-02-03 ENCOUNTER — Ambulatory Visit (INDEPENDENT_AMBULATORY_CARE_PROVIDER_SITE_OTHER): Admitting: *Deleted

## 2024-02-03 ENCOUNTER — Encounter: Payer: Self-pay | Admitting: Pulmonary Disease

## 2024-02-03 ENCOUNTER — Ambulatory Visit (INDEPENDENT_AMBULATORY_CARE_PROVIDER_SITE_OTHER): Admitting: Pulmonary Disease

## 2024-02-03 VITALS — BP 120/100 | HR 64 | Temp 98.4°F | Ht 64.0 in | Wt 159.0 lb

## 2024-02-03 DIAGNOSIS — R058 Other specified cough: Secondary | ICD-10-CM | POA: Diagnosis not present

## 2024-02-03 DIAGNOSIS — J45991 Cough variant asthma: Secondary | ICD-10-CM

## 2024-02-03 LAB — PULMONARY FUNCTION TEST
DL/VA % pred: 92 %
DL/VA: 3.8 ml/min/mmHg/L
DLCO cor % pred: 81 %
DLCO cor: 15.51 ml/min/mmHg
DLCO unc % pred: 81 %
DLCO unc: 15.51 ml/min/mmHg
FEF 25-75 Post: 1.65 L/s
FEF 25-75 Pre: 1.48 L/s
FEF2575-%Change-Post: 11 %
FEF2575-%Pred-Post: 102 %
FEF2575-%Pred-Pre: 92 %
FEV1-%Change-Post: 1 %
FEV1-%Pred-Post: 94 %
FEV1-%Pred-Pre: 93 %
FEV1-Post: 1.96 L
FEV1-Pre: 1.94 L
FEV1FVC-%Change-Post: 6 %
FEV1FVC-%Pred-Pre: 101 %
FEV6-%Change-Post: -3 %
FEV6-%Pred-Post: 92 %
FEV6-%Pred-Pre: 95 %
FEV6-Post: 2.44 L
FEV6-Pre: 2.53 L
FEV6FVC-%Change-Post: 1 %
FEV6FVC-%Pred-Post: 104 %
FEV6FVC-%Pred-Pre: 103 %
FVC-%Change-Post: -4 %
FVC-%Pred-Post: 88 %
FVC-%Pred-Pre: 92 %
FVC-Post: 2.45 L
FVC-Pre: 2.57 L
Post FEV1/FVC ratio: 80 %
Post FEV6/FVC ratio: 100 %
Pre FEV1/FVC ratio: 75 %
Pre FEV6/FVC Ratio: 98 %
RV % pred: 72 %
RV: 1.67 L
TLC % pred: 85 %
TLC: 4.3 L

## 2024-02-03 NOTE — Patient Instructions (Signed)
  VISIT SUMMARY: You came in for a follow-up after your recent COVID-19 infection. We discussed your chronic cough and gastroesophageal reflux symptoms, and reviewed your treatment plan.  YOUR PLAN: CHRONIC COUGH: Your chronic cough is likely due to upper airway cough syndrome and possibly gastroesophageal reflux disease. Your lung function tests are normal, indicating no asthma. -Continue using Flonase and chlorpheniramine as needed for your cough. -Use Prilosec occasionally for acid reflux symptoms. -Follow up with your primary care provider for ongoing management.  RECURRENT COVID-19 INFECTION: You had your third COVID-19 infection in August, which was milder than previous infections. It was managed with Paxlovid, and your vaccination status is up to date. -No specific treatment needed at this time as your symptoms have resolved. -Continue to stay up to date with your vaccinations.                               Contains text generated by [employer].

## 2024-02-03 NOTE — Patient Instructions (Signed)
 Full PFT performed today.

## 2024-02-03 NOTE — Progress Notes (Signed)
 Caitlin Walker    [id number]    01-17-1946  Primary Care Physician:Madden, Artist PARAS, MD  Referring Physician: Okey Carlin Redbird, MD [address] [place],  [place] [address]  Chief complaint: Follow up for chronic cough  HPI: 78 y.o. with history of hypertension, hyperlipidemia Developed chronic nonproductive cough in nov 2020.  Cough is nonproductive in nature, paroxysmal, no associated fevers or chills. The episodes of cough have precipitated a pulled chest muscle, hemorrhoids and conjunctival hemorrhage.  Treated with amoxicillin, Depo Medrol  injection, Tessalon , Prilosec.  She had a chest x-ray at urgent care around November 2020 and was told it was normal  She started taking Zyrtec with Flonase few weeks ago and reports that the cough has improved. Previously seen in pulmonary clinic by Dr. Darlean in 2017 for similar complaints.  She reports recurrence of these episodes about 1 time every year.  She is okay in the interim  Seen back in clinic in September 2023 for recurrence of cough after she developed mild COVID-19 infection Restarted Prilosec, Flonase and chlorpheniramine with marked improvement She was also given prednisone taper Chest x-ray in September did not show any acute abnormality Given Symbicort  but stopped using as it caused side effects of hoarseness  Interim history: Discussed the use of AI scribe software for clinical note transcription with the patient, who gave verbal consent to proceed. History of Present Illness  Caitlin Walker is a 78 year old female who presents for follow-up after a recent COVID-19 infection.  Recent covid-19 infection - Third COVID-19 infection occurred in August - Initial symptoms included sore throat and fever - Previous infections involved nasal congestion and rhinorrhea - Treated with Paxlovid, resulting in a negative test  Cough and upper respiratory symptoms - Current dry cough, attributed to dry air at home -  Uses Flonase and chlorpheniramine during coughing episodes with good effect - No inhaler use - No significant breathing issues - No concerns about going out due to coughing  Gastroesophageal reflux symptoms - Occasional use of Prilosec for acid reflux - Prilosec is effective when symptoms occur  Relevant pulmonary history Pets: No pets Occupation: Retired Geologist, engineering Exposures: No known exposures.  No mold, hot tub, Jacuzzi or no down pillows or comforters Smoking history: Never smoker Travel history: Recently lived in [place] .  No significant recent travel Relevant family history: Father died of emphysema.  He was a smoker   Outpatient Encounter Medications as of 02/03/2024  Medication Sig   calcium carbonate (OS-CAL) 600 MG TABS Take 600 mg by mouth daily with breakfast.    chlorpheniramine (CHLOR-TRIMETON) 4 MG tablet as needed for allergies.   fluticasone (FLONASE) 50 MCG/ACT nasal spray Place 2 sprays into both nostrils daily.   hydrALAZINE  (APRESOLINE ) 25 MG tablet TAKE 1 TABLET(50 MG) BY MOUTH THREE TIMES DAILY   losartan  (COZAAR ) 100 MG tablet Take 1 tablet (100 mg total) by mouth daily.   Magnesium  Oxide 400 MG CAPS Take 1 capsule (400 mg total) by mouth in the morning and at bedtime.   omeprazole (PRILOSEC) 20 MG capsule daily.   spironolactone  (ALDACTONE ) 25 MG tablet TAKE 1 TABLET(25 MG) BY MOUTH DAILY   zolpidem (AMBIEN) 10 MG tablet Take 1 tablet by mouth at bedtime as needed for sleep.   hydrochlorothiazide  (MICROZIDE ) 12.5 MG capsule Take 1 capsule (12.5 mg total) by mouth daily. (Patient not taking: Reported on 02/03/2024)   No facility-administered encounter medications on file as  of 02/03/2024.   Today's Vitals   02/03/24 1052  BP: (!) 120/102  Pulse: 64  Temp: 98.4 F (36.9 C)  TempSrc: Oral  SpO2: 99%  Weight: 159 lb (72.1 kg)  Height: 5' 4 (1.626 m)   Body mass index is 27.29 kg/m.   Physical Exam GEN: No acute distress. CV: Regular rate and  rhythm, no murmurs. LUNGS: Clear to auscultation bilaterally, normal respiratory effort. SKIN JOINTS: Warm and dry, no rash.    Data Reviewed: Imaging: CTA 10/15/2017-no pulmonary embolism, mild lower lobe subsegmental atelectasis. Chest x-ray 06/28/2019-no acute process. Chest x-ray 01/28/2021-low lung volumes, clear lungs I have reviewed the images personally.  PFTs: 09/07/2019 FVC 2.44 [9 9%], FEV1 2.06 [99%], F/F 84, TLC 4.29 [82%], DLCO 14.75 [73%] Minimal diffusion defect  02/03/2024 FVC 2.45 [88%], FEV1 1.96 [94%], F/F80, TLC 4.30 [85%], DLCO 15.51 [1%] Normal test  Labs: CBC 06/28/2019-WBC 4.2, eos 4%, absolute eosinophil count 168 IgE 06/28/2019-241  Assessment & Plan Chronic cough due to upper airway cough syndrome and possible gastroesophageal reflux disease Chronic cough likely due to upper airway cough syndrome and possible gastroesophageal reflux disease. Lung function tests are normal, indicating no asthma. Cough is intermittent and managed with Flonase, chlorpheniramine, and occasional Prilosec as needed. - Continue Flonase, chlorpheniramine, and Prilosec as needed for cough management. - Follow up with primary care provider for ongoing management.  Recurrent COVID-19 infection Recent COVID-19 infection in August 2025, third occurrence. Symptoms included sore throat and fever, milder than previous infections. Managed with Paxlovid, resulting in negative test. Vaccination status up to date, contributing to milder disease course.   Plan/Recommendations: Continue PRN management for chronic cough  Can be discharged from pulmonary clinic and return as needed  Beautiful Pensyl MD [facility] 02/03/2024, 11:12 AM  CC: Okey Carlin Redbird, MD

## 2024-02-03 NOTE — Progress Notes (Signed)
 Full PFT performed today.

## 2024-02-04 ENCOUNTER — Other Ambulatory Visit: Payer: Self-pay | Admitting: Internal Medicine

## 2024-02-04 DIAGNOSIS — I10 Essential (primary) hypertension: Secondary | ICD-10-CM

## 2024-02-04 MED ORDER — LOSARTAN POTASSIUM 100 MG PO TABS: 100.0000 mg | ORAL_TABLET | Freq: Every day | ORAL | 3 refills | Status: AC

## 2024-02-09 ENCOUNTER — Ambulatory Visit
Admission: RE | Admit: 2024-02-09 | Discharge: 2024-02-09 | Disposition: A | Discharge status: Home / Self Care | Source: Ambulatory Visit | Attending: Family Medicine | Admitting: Family Medicine

## 2024-02-09 DIAGNOSIS — Z1231 Encounter for screening mammogram for malignant neoplasm of breast: Secondary | ICD-10-CM

## 2024-02-17 DIAGNOSIS — Z01419 Encounter for gynecological examination (general) (routine) without abnormal findings: Secondary | ICD-10-CM | POA: Diagnosis not present

## 2024-02-17 DIAGNOSIS — Z6827 Body mass index (BMI) 27.0-27.9, adult: Secondary | ICD-10-CM | POA: Diagnosis not present

## 2024-04-05 DIAGNOSIS — H9201 Otalgia, right ear: Secondary | ICD-10-CM | POA: Diagnosis not present

## 2024-04-05 DIAGNOSIS — H60331 Swimmer's ear, right ear: Secondary | ICD-10-CM | POA: Diagnosis not present

## 2024-05-30 ENCOUNTER — Encounter: Payer: Self-pay | Admitting: Internal Medicine

## 2024-05-30 DIAGNOSIS — I10 Essential (primary) hypertension: Secondary | ICD-10-CM

## 2024-05-30 MED ORDER — HYDROCHLOROTHIAZIDE 12.5 MG PO CAPS: 25.0000 mg | ORAL_CAPSULE | Freq: Every day | ORAL | Status: AC
